# Patient Record
Sex: Female | Born: 1950 | Race: White | Hispanic: No | Marital: Married | Smoking: Never smoker
Health system: Southern US, Community
[De-identification: ages and names within clinical notes are randomized; demographics above are authoritative.]

## PROBLEM LIST (undated history)

## (undated) DIAGNOSIS — N809 Endometriosis, unspecified: Secondary | ICD-10-CM

## (undated) DIAGNOSIS — K219 Gastro-esophageal reflux disease without esophagitis: Secondary | ICD-10-CM

## (undated) DIAGNOSIS — F419 Anxiety disorder, unspecified: Secondary | ICD-10-CM

## (undated) DIAGNOSIS — K589 Irritable bowel syndrome without diarrhea: Secondary | ICD-10-CM

## (undated) DIAGNOSIS — R87619 Unspecified abnormal cytological findings in specimens from cervix uteri: Secondary | ICD-10-CM

## (undated) DIAGNOSIS — IMO0002 Reserved for concepts with insufficient information to code with codable children: Secondary | ICD-10-CM

## (undated) DIAGNOSIS — H269 Unspecified cataract: Secondary | ICD-10-CM

## (undated) DIAGNOSIS — Z9889 Other specified postprocedural states: Secondary | ICD-10-CM

## (undated) DIAGNOSIS — E785 Hyperlipidemia, unspecified: Secondary | ICD-10-CM

## (undated) DIAGNOSIS — N952 Postmenopausal atrophic vaginitis: Secondary | ICD-10-CM

## (undated) DIAGNOSIS — M1990 Unspecified osteoarthritis, unspecified site: Secondary | ICD-10-CM

## (undated) DIAGNOSIS — Z8489 Family history of other specified conditions: Secondary | ICD-10-CM

## (undated) DIAGNOSIS — M7070 Other bursitis of hip, unspecified hip: Secondary | ICD-10-CM

## (undated) DIAGNOSIS — I10 Essential (primary) hypertension: Secondary | ICD-10-CM

## (undated) DIAGNOSIS — T7840XA Allergy, unspecified, initial encounter: Secondary | ICD-10-CM

## (undated) DIAGNOSIS — R112 Nausea with vomiting, unspecified: Secondary | ICD-10-CM

## (undated) HISTORY — DX: Hyperlipidemia, unspecified: E78.5

## (undated) HISTORY — DX: Endometriosis, unspecified: N80.9

## (undated) HISTORY — DX: Unspecified abnormal cytological findings in specimens from cervix uteri: R87.619

## (undated) HISTORY — DX: Other bursitis of hip, unspecified hip: M70.70

## (undated) HISTORY — PX: COLONOSCOPY: SHX174

## (undated) HISTORY — PX: CRYOTHERAPY: SHX1416

## (undated) HISTORY — DX: Unspecified cataract: H26.9

## (undated) HISTORY — PX: UPPER GASTROINTESTINAL ENDOSCOPY: SHX188

## (undated) HISTORY — DX: Gastro-esophageal reflux disease without esophagitis: K21.9

## (undated) HISTORY — DX: Irritable bowel syndrome, unspecified: K58.9

## (undated) HISTORY — DX: Reserved for concepts with insufficient information to code with codable children: IMO0002

## (undated) HISTORY — DX: Essential (primary) hypertension: I10

## (undated) HISTORY — DX: Postmenopausal atrophic vaginitis: N95.2

## (undated) HISTORY — PX: EYE SURGERY: SHX253

## (undated) HISTORY — PX: BREAST SURGERY: SHX581

## (undated) HISTORY — DX: Allergy, unspecified, initial encounter: T78.40XA

## (undated) HISTORY — PX: CATARACT EXTRACTION: SUR2

## (undated) HISTORY — PX: CERVIX LESION DESTRUCTION: SHX591

## (undated) HISTORY — PX: JOINT REPLACEMENT: SHX530

## (undated) HISTORY — PX: POLYPECTOMY: SHX149

---
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
## 1971-01-24 HISTORY — PX: TUBAL LIGATION: SHX77

## 1971-01-24 HISTORY — PX: HYSTEROTOMY: SHX1776

## 1982-11-24 HISTORY — PX: ABDOMINAL HYSTERECTOMY: SHX81

## 1993-02-23 HISTORY — PX: TMJ ARTHROPLASTY: SHX1066

## 2000-09-12 ENCOUNTER — Encounter: Admission: RE | Admit: 2000-09-12 | Discharge: 2000-09-12 | Payer: Self-pay | Admitting: *Deleted

## 2000-09-12 ENCOUNTER — Encounter: Payer: Self-pay | Admitting: Family Medicine

## 2001-09-05 ENCOUNTER — Encounter: Payer: Self-pay | Admitting: Family Medicine

## 2001-09-05 ENCOUNTER — Encounter: Admission: RE | Admit: 2001-09-05 | Discharge: 2001-09-05 | Payer: Self-pay | Admitting: Family Medicine

## 2002-05-02 ENCOUNTER — Ambulatory Visit (HOSPITAL_COMMUNITY): Admission: RE | Admit: 2002-05-02 | Discharge: 2002-05-02 | Payer: Self-pay | Admitting: Family Medicine

## 2002-05-02 ENCOUNTER — Encounter: Payer: Self-pay | Admitting: Family Medicine

## 2002-09-19 ENCOUNTER — Encounter: Admission: RE | Admit: 2002-09-19 | Discharge: 2002-09-19 | Payer: Self-pay | Admitting: Family Medicine

## 2002-09-19 ENCOUNTER — Encounter: Payer: Self-pay | Admitting: Family Medicine

## 2003-08-28 ENCOUNTER — Encounter: Admission: RE | Admit: 2003-08-28 | Discharge: 2003-08-28 | Payer: Self-pay | Admitting: Family Medicine

## 2003-09-12 ENCOUNTER — Emergency Department (HOSPITAL_COMMUNITY): Admission: EM | Admit: 2003-09-12 | Discharge: 2003-09-12 | Payer: Self-pay | Admitting: Emergency Medicine

## 2003-12-23 ENCOUNTER — Ambulatory Visit (HOSPITAL_COMMUNITY): Admission: RE | Admit: 2003-12-23 | Discharge: 2003-12-23 | Payer: Self-pay | Admitting: Family Medicine

## 2003-12-28 ENCOUNTER — Ambulatory Visit (HOSPITAL_COMMUNITY): Admission: RE | Admit: 2003-12-28 | Discharge: 2003-12-28 | Payer: Self-pay | Admitting: Family Medicine

## 2004-04-25 HISTORY — PX: BUNIONECTOMY: SHX129

## 2004-07-26 ENCOUNTER — Ambulatory Visit: Payer: Self-pay | Admitting: Gastroenterology

## 2004-09-05 ENCOUNTER — Ambulatory Visit (HOSPITAL_COMMUNITY): Admission: RE | Admit: 2004-09-05 | Discharge: 2004-09-05 | Payer: Self-pay | Admitting: Family Medicine

## 2004-09-23 ENCOUNTER — Encounter: Admission: RE | Admit: 2004-09-23 | Discharge: 2004-09-23 | Payer: Self-pay | Admitting: Family Medicine

## 2005-01-06 ENCOUNTER — Ambulatory Visit (HOSPITAL_COMMUNITY): Admission: RE | Admit: 2005-01-06 | Discharge: 2005-01-06 | Payer: Self-pay | Admitting: Podiatry

## 2005-01-27 ENCOUNTER — Ambulatory Visit (HOSPITAL_COMMUNITY): Admission: RE | Admit: 2005-01-27 | Discharge: 2005-01-27 | Payer: Self-pay | Admitting: Family Medicine

## 2005-02-23 HISTORY — PX: LAPAROSCOPIC CHOLECYSTECTOMY: SUR755

## 2005-05-03 ENCOUNTER — Ambulatory Visit (HOSPITAL_COMMUNITY): Admission: RE | Admit: 2005-05-03 | Discharge: 2005-05-03 | Payer: Self-pay | Admitting: Podiatry

## 2005-06-29 ENCOUNTER — Encounter (INDEPENDENT_AMBULATORY_CARE_PROVIDER_SITE_OTHER): Payer: Self-pay | Admitting: Internal Medicine

## 2005-06-29 LAB — CONVERTED CEMR LAB: Pap Smear: NORMAL

## 2005-10-06 ENCOUNTER — Encounter: Admission: RE | Admit: 2005-10-06 | Discharge: 2005-10-06 | Payer: Self-pay | Admitting: Family Medicine

## 2006-01-25 ENCOUNTER — Ambulatory Visit (HOSPITAL_COMMUNITY): Admission: RE | Admit: 2006-01-25 | Discharge: 2006-01-25 | Payer: Self-pay | Admitting: Family Medicine

## 2006-02-02 ENCOUNTER — Encounter (HOSPITAL_COMMUNITY): Admission: RE | Admit: 2006-02-02 | Discharge: 2006-03-04 | Payer: Self-pay | Admitting: Family Medicine

## 2006-02-22 ENCOUNTER — Ambulatory Visit: Payer: Self-pay | Admitting: Internal Medicine

## 2006-02-28 ENCOUNTER — Ambulatory Visit: Payer: Self-pay | Admitting: Gastroenterology

## 2006-03-15 ENCOUNTER — Encounter: Admission: RE | Admit: 2006-03-15 | Discharge: 2006-03-15 | Payer: Self-pay | Admitting: General Surgery

## 2006-05-15 ENCOUNTER — Ambulatory Visit: Payer: Self-pay | Admitting: Internal Medicine

## 2006-09-21 ENCOUNTER — Ambulatory Visit: Payer: Self-pay | Admitting: Internal Medicine

## 2006-09-27 ENCOUNTER — Encounter (INDEPENDENT_AMBULATORY_CARE_PROVIDER_SITE_OTHER): Payer: Self-pay | Admitting: Internal Medicine

## 2006-09-27 LAB — CONVERTED CEMR LAB
ALT: 25 units/L (ref 0–35)
AST: 17 units/L (ref 0–37)
Albumin: 4.3 g/dL (ref 3.5–5.2)
Alkaline Phosphatase: 66 units/L (ref 39–117)
BUN: 15 mg/dL (ref 6–23)
CO2: 28 meq/L (ref 19–32)
Calcium: 9.1 mg/dL (ref 8.4–10.5)
Chloride: 102 meq/L (ref 96–112)
Cholesterol: 194 mg/dL (ref 0–200)
Creatinine, Ser: 0.67 mg/dL (ref 0.40–1.20)
Glucose, Bld: 98 mg/dL (ref 70–99)
HDL: 64 mg/dL (ref >39)
LDL Cholesterol: 99 mg/dL (ref 0–99)
Potassium: 3.8 meq/L (ref 3.5–5.3)
Sodium: 139 meq/L (ref 135–145)
Total Bilirubin: 0.4 mg/dL (ref 0.3–1.2)
Total CHOL/HDL Ratio: 3
Total Protein: 7.3 g/dL (ref 6.0–8.3)
Triglycerides: 157 mg/dL — ABNORMAL HIGH (ref <150)
VLDL: 31 mg/dL (ref 0–40)

## 2006-09-28 DIAGNOSIS — K589 Irritable bowel syndrome without diarrhea: Secondary | ICD-10-CM | POA: Insufficient documentation

## 2006-09-28 DIAGNOSIS — M26609 Unspecified temporomandibular joint disorder, unspecified side: Secondary | ICD-10-CM | POA: Insufficient documentation

## 2006-09-28 DIAGNOSIS — E785 Hyperlipidemia, unspecified: Secondary | ICD-10-CM | POA: Insufficient documentation

## 2006-09-28 DIAGNOSIS — K219 Gastro-esophageal reflux disease without esophagitis: Secondary | ICD-10-CM | POA: Insufficient documentation

## 2006-09-28 DIAGNOSIS — M129 Arthropathy, unspecified: Secondary | ICD-10-CM | POA: Insufficient documentation

## 2006-09-28 DIAGNOSIS — M545 Low back pain, unspecified: Secondary | ICD-10-CM | POA: Insufficient documentation

## 2006-09-28 DIAGNOSIS — I10 Essential (primary) hypertension: Secondary | ICD-10-CM | POA: Insufficient documentation

## 2006-09-28 DIAGNOSIS — M1990 Unspecified osteoarthritis, unspecified site: Secondary | ICD-10-CM | POA: Insufficient documentation

## 2006-09-28 DIAGNOSIS — J309 Allergic rhinitis, unspecified: Secondary | ICD-10-CM | POA: Insufficient documentation

## 2006-10-17 ENCOUNTER — Encounter: Admission: RE | Admit: 2006-10-17 | Discharge: 2006-10-17 | Payer: Self-pay | Admitting: Internal Medicine

## 2007-01-29 ENCOUNTER — Telehealth (INDEPENDENT_AMBULATORY_CARE_PROVIDER_SITE_OTHER): Payer: Self-pay | Admitting: Internal Medicine

## 2007-01-31 ENCOUNTER — Encounter (INDEPENDENT_AMBULATORY_CARE_PROVIDER_SITE_OTHER): Payer: Self-pay | Admitting: Internal Medicine

## 2007-03-06 ENCOUNTER — Ambulatory Visit: Payer: Self-pay | Admitting: Internal Medicine

## 2007-03-06 DIAGNOSIS — R209 Unspecified disturbances of skin sensation: Secondary | ICD-10-CM | POA: Insufficient documentation

## 2007-05-18 ENCOUNTER — Ambulatory Visit (HOSPITAL_COMMUNITY): Admission: RE | Admit: 2007-05-18 | Discharge: 2007-05-18 | Payer: Self-pay | Admitting: Internal Medicine

## 2007-05-23 ENCOUNTER — Ambulatory Visit: Payer: Self-pay | Admitting: Gastroenterology

## 2007-05-23 ENCOUNTER — Ambulatory Visit (HOSPITAL_COMMUNITY): Admission: RE | Admit: 2007-05-23 | Discharge: 2007-05-23 | Payer: Self-pay | Admitting: Internal Medicine

## 2007-06-07 ENCOUNTER — Ambulatory Visit: Payer: Self-pay | Admitting: Gastroenterology

## 2007-10-24 ENCOUNTER — Encounter: Admission: RE | Admit: 2007-10-24 | Discharge: 2007-10-24 | Payer: Self-pay | Admitting: Internal Medicine

## 2008-04-30 ENCOUNTER — Encounter (HOSPITAL_COMMUNITY): Admission: RE | Admit: 2008-04-30 | Discharge: 2008-05-30 | Payer: Self-pay | Admitting: Orthopedic Surgery

## 2008-10-29 ENCOUNTER — Encounter: Admission: RE | Admit: 2008-10-29 | Discharge: 2008-10-29 | Payer: Self-pay | Admitting: Internal Medicine

## 2009-10-18 ENCOUNTER — Encounter: Admission: RE | Admit: 2009-10-18 | Discharge: 2009-10-18 | Payer: Self-pay | Admitting: Internal Medicine

## 2010-10-16 ENCOUNTER — Encounter: Payer: Self-pay | Admitting: Family Medicine

## 2010-10-20 ENCOUNTER — Encounter
Admission: RE | Admit: 2010-10-20 | Discharge: 2010-10-20 | Payer: Self-pay | Source: Home / Self Care | Attending: Internal Medicine | Admitting: Internal Medicine

## 2010-10-27 NOTE — Progress Notes (Signed)
Summary: pulled muscle  Phone Note Call from Patient   Reason for Call: Talk to Nurse Summary of Call: patient thinks she pulled a muscle and is having pain radiating into her hip...i don't have anything available until friday 05.09.08.Marland KitchenMarland Kitchenplease triage  [phone] Initial call taken by: Donneta Romberg,  Jan 29, 2007 11:31 AM  Follow-up for Phone Call        She was cleaning(moving rugs/furniture/etc) and thinks she pulled a muscle. Advise ice/aleve and call clinic in AM if no better. Voices understanding Follow-up by: Sonny Dandy,  Jan 29, 2007 1:41 PM  Additional Follow-up for Phone Call Additional follow up Details #1::        Agree Additional Follow-up by: Erle Crocker MD,  Jan 29, 2007 1:59 PM    Appended Document: pulled muscle Patient states she is some better, but still tender in abd. Aleve and ice helped with back. Wants to be seen, will schedule her for Monday @ 1:30pm

## 2010-10-27 NOTE — Assessment & Plan Note (Signed)
Summary: numbness 4th and 5th fingers on L   Vital Signs:  Patient Profile:   59 Years Old Female O2 Sat:      98 % Pulse rate:   100 / minute Resp:     9 per minute BP sitting:   138 / 94  (right arm)  Vitals Entered By: Lutricia Horsfall (March 06, 2007 4:03 PM) Oxygen therapy Room Air               Chief Complaint:  numbness and tingling in left hand and arm.  History of Present Illness: Jennifer Davenport presents today having trouble with numbness in her fourth and fifth fingers of her left hand.  It  has been going on for about 4 to 5 months, and worsening in intensity.  Early in the day she may note no symptoms eventually she'll develop the numbness and even gets some pain in her arm late in the day.  She is also noting what she calls a burning sensation that radiates around her ear and awakens her at night.  It will get better if she stands up.  She noted some of this at the beginning of the year but it resolved.  She has noted it nightly for the past 6 weeks.  She will also know what she called a knot at the base of her neck at the end of each day.  Current Allergies: SULFA CODEINE ASPIRIN ROCEPHIN  Past Medical History:    Reviewed history from 01/31/2007 and no changes required:       GERD       Hyperlipidemia       Hypertension       Low back pain       Osteoarthritis       Allergic rhinitis       IBS       TMJ      Physical Exam  General:     Obese, in no acute distress. Alert and oriented X 3.  Msk:     there is some tenderness to palpation over the upper trapezius muscle on the left at the insertion into the occiput.  There is also tenderness to palpation over the left scalene muscles.  There is no point tenderness of the spine. Neurologic:     she does have some decreased strength of the fourth and fifth fingers of the left hand as I am able to break a ring made with her thumb, which I can not do on her right.    Impression &  Recommendations:  Problem # 1:  NUMBNESS, HAND (ICD-782.0) her numbness certainly seems to be in a dermatomal distribution,  suggestive of C-spine disease.  We arranged treat conservatively with Relafen 750 mg twice a day on a regular schedule for at least two weeks as well as neck stretching exercises.  I've asked her to let me know if this does not make her feel better and then we will get some imaging studies.  Medications Added to Medication List This Visit: 1)  Nabumetone 750 Mg Tabs (Nabumetone) .[name].. 1 by mouth two times a day

## 2010-10-27 NOTE — Letter (Signed)
Summary: Historic correspondence  Historic correspondence   Imported By: Curtis Sites 07/19/2009 14:20:27  _____________________________________________________________________  External Attachment:    Type:   Image     Comment:   External Document

## 2010-10-27 NOTE — Letter (Signed)
Summary: Historic rma chart  Historic rma chart   Imported By: Curtis Sites 07/15/2009 10:24:03  _____________________________________________________________________  External Attachment:    Type:   Image     Comment:   External Document

## 2011-02-10 NOTE — H&P (Signed)
Jennifer Davenport, Jennifer Davenport              ACCOUNT NO.:  [id number]   MEDICAL RECORD NO.:  [id number]          PATIENT TYPE:  AMB   LOCATION:  DAY                           FACILITY:  [facility]   PHYSICIAN:  Denny Peon. Ulice Brilliant, D.P.M.  DATE OF BIRTH:  03/18/1951   DATE OF ADMISSION:  05/03/2005  DATE OF DISCHARGE:  LH                                HISTORY & PHYSICAL   HISTORY OF PRESENT ILLNESS:  Painful HAV deformity of left foot.  The  patient presents and relates that this has been bothering her for probably  16 months at this point.  It seems like it is getting progressively worse.  It basically aches all the time, worse with certain shoes.   PAST MEDICAL HISTORY:  1.  Significant for some hypertension.  2.  History of TMJ.  She has and previous surgery for this.  3.  She has had a hysterectomy.  4.  She has had 2 children.   CURRENT MEDICATIONS:  Triamterene.   ALLERGIES:  1.  SULFA.  2.  CODEINE.  3.  NSAIDS cause some reflux.  4.  ASPIRIN.  5.  It has also come to my attention that she may have an allergy to      ROCEPHIN.   SOCIAL HISTORY:  She drinks very occasionally.  She does not smoke.  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   OBJECTIVE:  Pronounced HAV deformity of left foot.  The medial eminence is  noted to be pretty prominent.  The great toe is beginning to abut the second  toe.   Radiographs were taken, and she has an increase in the IM angle between the  first and second metatarsal.  The great toe is abutting the second toe.  She  looks like she may have a deviation in the pasa.  There looks like some  arthritic change around the medial aspect of the first metatarsal head.  There is a spur noted on the medial aspect of the first metatarsal head,  medial to the base of the proximal phalanx.  There looks to be some  arthritic change around the fibular sesamoid.   ASSESSMENT:  HAV deformity, left.   PLAN:  Surgical correction will consistent of  an Austin bunionectomy of the  left foot.  This may possibly have to be converted to a Reverdin  bunionectomy, left foot, depending on the position of the articular  cartilage.  I have discussed the procedure with the patient, the fact that  we will be removing some bone from the medial eminence, as  well as performing an osteotomy, along with correcting with internal  fixation.  She has been described the usual postoperative course for an  [employer].  She has read her consent form, apparently understood,  and signed.  She has played a part in the discussion during this consent  signing about the possibility of complications.       CMD/MEDQ  D:  05/02/2005  T:  05/02/2005  Job:  [id number]

## 2011-02-10 NOTE — Op Note (Signed)
NAMEAUDELIA, KNAPE              ACCOUNT NO.:  [id number]   MEDICAL RECORD NO.:  [id number]          PATIENT TYPE:  AMB   LOCATION:  DAY                           FACILITY:  [facility]   PHYSICIAN:  Denny Peon. Ulice Brilliant, D.P.M.  DATE OF BIRTH:  08-07-1951   DATE OF PROCEDURE:  05/03/2005  DATE OF DISCHARGE:                                 OPERATIVE REPORT   PREOPERATIVE DIAGNOSIS:  Hallux abductovalgus deformity, left foot. Ingrown  toe nail, fibular nail margin, right great toe.   POSTOPERATIVE DIAGNOSIS:  Hallux abductovalgus deformity, left foot. Ingrown  toe nail, fibular nail margin, right great toe.   PROCEDURE PERFORMED:  Austin bunionectomy, left foot. Phenol and alcohol  removal of nail margin and chemical cauterization, right great toe   SURGEON:  Cody M. Ulice Brilliant, D.P.M.   ANESTHESIA:  Monitored anesthesia care.   INDICATIONS FOR SURGERY:  Acute painful ingrown toenail, right great toe.  Long-standing problems with HAV deformity of the left foot. The patient is  clinically noted to have a pronounced medial eminence, great toe is abutting  the second toe. Radiographs reveal a large increase in the IM angle. The HAV  angle is increased. She has got arthritic change about the dorsal and medial  aspect of the joint itself.   DESCRIPTION OF PROCEDURE:  Ms. Laroque was brought into the OR and placed on  the table in a supine position. A pneumatic ankle tourniquet is applied  across her left ankle. IV sedation is established. A Mayo block is performed  about the first MTP of the left foot. A digital block is performed about the  right great toe. Both blocks are a combination of 1:1 lidocaine and  Marcaine. The left foot is prepped and draped in the usual aseptic fashion.  An Ace bandage was utilized to exsanguinate the left foot. The tourniquet  was inflated to 250 mmHg.   PROCEDURE NOTE:  Austin bunionectomy, left foot. Attention is directed to  this first MTP of the left foot. A 7-cm  curvilinear skin incision is made.  The incision is deepened by sharp and blunt dissection through the  subcutaneous tissues. Care is taken to get good exposure of the medial  aspect of the joint capsule. An inverted L capsulotomy was then performed.  The capsule and periosteal tissues were reflected away from the underlying  bony surface. The medial eminence was delivered into the surgical wound and  excised utilizing a #63 blade on the oscillating saw. The first interspace  was then approached through the original skin incision with the aid of a  Weitlaner retractor. Dissection is carried down to the level of the fibular  sesamoid. An abductor hallicis tenotomy is performed with lateral capsular  release. Attention was then directed back to the first metatarsal head. A  0.045 K wire was introduced and utilized to act as a pin axis guide. The  osteotomy cuts were then made with a #62 blade and the #63 blade on the  oscillating saw. The capital fragment was relocated laterally approximately  50% across the width of the first metatarsal. This was  then fixated via two  0.045 K wires driven in a divergent fashion so that redundant protrusion of  K wire is left exiting through articular surface. This was both visualized  intraoperatively utilizing __________ scan and putting the toe through a  range of motion to find there is no restriction. The redundant bone medially  was then excised utilizing the #63 blade and an oscillating saw. The  osteotomy was deemed stable. The newly formed osseous surface was rasped  smooth. Prior to making the [facility] cuts, we did remove an osteophyte  dorsally from the first metatarsal head. Following rasping and smoothing  down the newly formed osseous surface, the wound was flushed with copious  amounts of irrigant. Capsule tissues were reapproximated and closed with 3-0  Vicryl. Subcutaneous tissue were reapproximated and closed with 4-0 Vicryl.  Skin was closed  with 4-0 Vicryl and a running subcuticular suture. Steri-  Strips were applied to the foot postoperatively. A postoperative injection  of Marcaine and Hexadrol was dispensed. A Betadine soaked Adaptic dressing  and a dry sterile compressive dressing follows. The tourniquet was deflated  on his left ankle, the tourniquet time spanning roughly 45 to 55 minutes.   Following completion of the left foot and securing of the foot in a bandage,  the patient is removed of draping. The right foot is exposed. It is prepped  with Betadine solution. A freer elevator is then introduced and utilized to  resect soft tissue adherence away from the ingrowing nail margin of the  fibular border of the right great toe. A straight nail nipper was utilized  to split the nail longitudinally from distal to proximal, removing an  approximate 1/4 to 1/5 of the width of the nail through this lateral nail  wedge. The nail matrix to this removed nail margin is then cauterized  utilizing sodium hydroxide with three 30-second applications. This was then  flushed utilizing alcohol. Thi was then dressed utilizing neosporin and  adaptive dressing, 2x2 and 2-inch cling.   Ms. Kinkaid tolerated the anesthesia and procedure well. She was transported  to day hospital without incident. A cam walker was applied to her left foot,  a surgical shoe to the right. A list of written instructions including  postoperative wound care instructions for the right are dispensed. A  prescription for Mepergan fortis is dispensed as the patient as a CODEINE  ALLERGY, dispensed 35 one p.o. q.3-4h. postoperative pain. I have instructed  her on elevation and ice. She is also instructed that all weight bearing  needs to take place with a cam walker present on the left foot. She is  encouraged not to rest with it on, however. She will be seen within seven  days for first postoperative visit.       CMD/MEDQ  D:  05/03/2005  T:  05/03/2005  Job:   [id number]

## 2011-04-20 LAB — HM PAP SMEAR: HM Pap smear: NEGATIVE

## 2011-09-12 ENCOUNTER — Other Ambulatory Visit: Payer: Self-pay | Admitting: Internal Medicine

## 2011-09-12 DIAGNOSIS — Z1231 Encounter for screening mammogram for malignant neoplasm of breast: Secondary | ICD-10-CM

## 2011-10-23 ENCOUNTER — Ambulatory Visit
Admission: RE | Admit: 2011-10-23 | Discharge: 2011-10-23 | Disposition: A | Discharge status: Home / Self Care | Payer: PRIVATE HEALTH INSURANCE | Source: Ambulatory Visit | Attending: Internal Medicine | Admitting: Internal Medicine

## 2011-10-23 DIAGNOSIS — Z1231 Encounter for screening mammogram for malignant neoplasm of breast: Secondary | ICD-10-CM

## 2012-04-17 ENCOUNTER — Encounter: Payer: Self-pay | Admitting: Gastroenterology

## 2012-09-25 LAB — HM COLONOSCOPY

## 2012-10-07 ENCOUNTER — Other Ambulatory Visit: Payer: Self-pay | Admitting: Internal Medicine

## 2012-10-07 DIAGNOSIS — Z1231 Encounter for screening mammogram for malignant neoplasm of breast: Secondary | ICD-10-CM

## 2012-10-23 ENCOUNTER — Ambulatory Visit
Admission: RE | Admit: 2012-10-23 | Discharge: 2012-10-23 | Disposition: A | Discharge status: Home / Self Care | Payer: BC Managed Care – PPO | Source: Ambulatory Visit | Attending: Internal Medicine | Admitting: Internal Medicine

## 2012-10-23 DIAGNOSIS — Z1231 Encounter for screening mammogram for malignant neoplasm of breast: Secondary | ICD-10-CM

## 2012-10-26 DIAGNOSIS — IMO0002 Reserved for concepts with insufficient information to code with codable children: Secondary | ICD-10-CM

## 2012-10-26 HISTORY — DX: Reserved for concepts with insufficient information to code with codable children: IMO0002

## 2013-01-14 ENCOUNTER — Encounter: Payer: Self-pay | Admitting: Gastroenterology

## 2013-01-21 ENCOUNTER — Encounter: Payer: Self-pay | Admitting: Gastroenterology

## 2013-03-05 ENCOUNTER — Other Ambulatory Visit: Payer: Self-pay | Admitting: Family Medicine

## 2013-03-05 DIAGNOSIS — E559 Vitamin D deficiency, unspecified: Secondary | ICD-10-CM

## 2013-03-05 DIAGNOSIS — E2839 Other primary ovarian failure: Secondary | ICD-10-CM

## 2013-03-11 ENCOUNTER — Ambulatory Visit
Admission: RE | Admit: 2013-03-11 | Discharge: 2013-03-11 | Disposition: A | Discharge status: Home / Self Care | Payer: BC Managed Care – PPO | Source: Ambulatory Visit | Attending: Family Medicine | Admitting: Family Medicine

## 2013-03-11 ENCOUNTER — Other Ambulatory Visit: Payer: BC Managed Care – PPO

## 2013-03-11 DIAGNOSIS — E2839 Other primary ovarian failure: Secondary | ICD-10-CM

## 2013-03-11 DIAGNOSIS — E559 Vitamin D deficiency, unspecified: Secondary | ICD-10-CM

## 2013-03-18 ENCOUNTER — Ambulatory Visit (AMBULATORY_SURGERY_CENTER): Payer: BC Managed Care – PPO

## 2013-03-18 VITALS — Ht 61.0 in | Wt 171.2 lb

## 2013-03-18 DIAGNOSIS — Z1211 Encounter for screening for malignant neoplasm of colon: Secondary | ICD-10-CM

## 2013-03-18 DIAGNOSIS — Z86010 Personal history of colonic polyps: Secondary | ICD-10-CM

## 2013-03-18 MED ORDER — MOVIPREP 100 G PO SOLR: ORAL | Status: DC

## 2013-03-19 ENCOUNTER — Encounter: Payer: Self-pay | Admitting: Gastroenterology

## 2013-04-01 ENCOUNTER — Ambulatory Visit (AMBULATORY_SURGERY_CENTER): Payer: BC Managed Care – PPO | Admitting: Gastroenterology

## 2013-04-01 ENCOUNTER — Encounter: Payer: Self-pay | Admitting: Gastroenterology

## 2013-04-01 VITALS — BP 121/73 | HR 57 | Temp 97.6°F | Resp 16 | Ht 61.0 in | Wt 171.0 lb

## 2013-04-01 DIAGNOSIS — Z860100 Personal history of colon polyps, unspecified: Secondary | ICD-10-CM

## 2013-04-01 DIAGNOSIS — D126 Benign neoplasm of colon, unspecified: Secondary | ICD-10-CM

## 2013-04-01 MED ORDER — SODIUM CHLORIDE 0.9 % IV SOLN: 500.0000 mL | INTRAVENOUS | Status: DC

## 2013-04-01 NOTE — Progress Notes (Signed)
Patient did not have preoperative order for IV antibiotic SSI prophylaxis. (G8918)  Patient did not experience any of the following events: a burn prior to discharge; a fall within the facility; wrong site/side/patient/procedure/implant event; or a hospital transfer or hospital admission upon discharge from the facility. (G8907)  

## 2013-04-01 NOTE — Progress Notes (Signed)
Called to room to assist during endoscopic procedure.  Patient ID and intended procedure confirmed with present staff. Received instructions for my participation in the procedure from the performing physician.  

## 2013-04-01 NOTE — Patient Instructions (Addendum)

## 2013-04-01 NOTE — Op Note (Signed)
[facility] [address] [place], [address]   COLONOSCOPY PROCEDURE REPORT  PATIENT: Jennifer, Davenport  MR#: [id number] BIRTHDATE: Apr 25, 1951 , 61  yrs. old GENDER: Female ENDOSCOPIST: Meryl Dare, MD, [facility] PROCEDURE DATE:  04/01/2013 PROCEDURE:   Colonoscopy with biopsy ASA CLASS:   Class II INDICATIONS:Patient's personal history of adenomatous colon polyps and Last colonoscopy performed 5 years ago. MEDICATIONS: MAC sedation, administered by CRNA and propofol (Diprivan) 300mg  IV DESCRIPTION OF PROCEDURE:   After the risks benefits and alternatives of the procedure were thoroughly explained, informed consent was obtained.  A digital rectal exam revealed no abnormalities of the rectum.   The LB [id number]  endoscope was introduced through the anus and advanced to the cecum, which was identified by both the appendix and ileocecal valve. No adverse events experienced.   The quality of the prep was good, using MoviPrep  The instrument was then slowly withdrawn as the colon was fully examined.  COLON FINDINGS: A sessile polyp measuring 3 mm in size was found in the transverse colon.  A polypectomy was performed with cold forceps.  The resection was complete and the polyp tissue was completely retrieved.   A sessile polyp measuring 4 mm in size was found in the rectum.  A polypectomy was performed with cold forceps. The resection was complete and the polyp tissue was completely retrieved. Mild diverticulosis was noted in the sigmoid colon. The colon was otherwise normal.  There was no diverticulosis, inflammation, polyps or cancers unless previously stated.  Retroflexed views revealed small internal hemorrhoids. The time to cecum=2 minutes 17 seconds. Withdrawal time=9 minutes 15 seconds. The scope was withdrawn and the procedure completed. COMPLICATIONS: There were no complications.  ENDOSCOPIC IMPRESSION: 1.   Sessile polyp measuring 3 mm in the  transverse colon; polypectomy performed with cold forceps 2.   Sessile polyp measuring 4 mm in the rectum; polypectomy performed with cold forceps 3.   Mild diverticulosis was noted in the sigmoid colon 4.   Small internal hemorrhoids  RECOMMENDATIONS: 1.  Await pathology results 2.  High fiber diet with liberal fluid intake. 3.  Repeat Colonoscopy in 5 years.  eSigned:  Meryl Dare, MD, [facility] 04/01/2013 9:30 AM   cc: Beverley Fiedler, MD

## 2013-04-02 ENCOUNTER — Telehealth: Payer: Self-pay | Admitting: *Deleted

## 2013-04-02 NOTE — Telephone Encounter (Signed)
  Follow up Call-  Call back number 04/01/2013  Post procedure Call Back phone  # [phone]  Permission to leave phone message Yes     Patient questions:  Do you have a fever, pain , or abdominal swelling? no Pain Score  0 *  Have you tolerated food without any problems? yes  Have you been able to return to your normal activities? yes  Do you have any questions about your discharge instructions: Diet   no Medications  no Follow up visit  no  Do you have questions or concerns about your Care? no  Actions: * If pain score is 4 or above: No action needed, pain <4.

## 2013-04-09 ENCOUNTER — Encounter: Payer: Self-pay | Admitting: Gastroenterology

## 2013-04-17 ENCOUNTER — Encounter: Payer: Self-pay | Admitting: *Deleted

## 2013-04-24 ENCOUNTER — Ambulatory Visit (INDEPENDENT_AMBULATORY_CARE_PROVIDER_SITE_OTHER): Payer: BC Managed Care – PPO | Admitting: Nurse Practitioner

## 2013-04-24 ENCOUNTER — Encounter: Payer: Self-pay | Admitting: Nurse Practitioner

## 2013-04-24 VITALS — BP 130/62 | HR 74 | Resp 14 | Ht 60.25 in | Wt 175.0 lb

## 2013-04-24 DIAGNOSIS — Z0000 Encounter for general adult medical examination without abnormal findings: Secondary | ICD-10-CM

## 2013-04-24 DIAGNOSIS — Z01419 Encounter for gynecological examination (general) (routine) without abnormal findings: Secondary | ICD-10-CM

## 2013-04-24 LAB — POCT URINALYSIS DIPSTICK
Leukocytes, UA: NEGATIVE
Spec Grav, UA: 1.015
Urobilinogen, UA: NEGATIVE
pH, UA: 6

## 2013-04-24 MED ORDER — ESTRADIOL 0.1 MG/GM VA CREA: TOPICAL_CREAM | VAGINAL | Status: DC

## 2013-04-24 NOTE — Patient Instructions (Signed)

## 2013-04-24 NOTE — Progress Notes (Signed)
Encounter reviewed by Dr. Brook Silva.  

## 2013-04-24 NOTE — Progress Notes (Signed)
62 y.o. [id number] Married Caucasian Fe here for annual exam.  Has used estrace vaginal cream and Vagifem in past and likes the cream better.  Also using coconut oil prn for lubrication. No other health problems.  No LMP recorded. Patient has had a hysterectomy.          Sexually active: yes  The current method of family planning is status post hysterectomy.    Exercising: yes  treadmill and walking  Smoker:  no  Health Maintenance: Pap:  04/20/2011  negative MMG:  10/23/2012 normal with 3 D Colonoscopy:  03/2013 2 polyps 1 pre cancer - repeat in 5 years BMD:   02/2013 'normal' TDaP:  2014 Labs: PCP maintains all labs.    reports that she has never smoked. She has never used smokeless tobacco. She reports that she does not drink alcohol or use illicit drugs.  Past Medical History  Diagnosis Date  . Hyperlipidemia   . Hypertension   . Allergy   . IBS (irritable bowel syndrome)   . Bulging disc     CERVICAL  . GERD (gastroesophageal reflux disease)   . Atrophic vaginitis   . Endometriosis   . Acid reflux   . IBS (irritable bowel syndrome)     Past Surgical History  Procedure Laterality Date  . Appendectomy    . Tmj arthroplasty    . Cholecystectomy    . Bunionectomy      left foot  . Abdominal hysterectomy    . Tubal ligation Bilateral     Current Outpatient Prescriptions  Medication Sig Dispense Refill  . amLODipine (NORVASC) 2.5 MG tablet Take 2.5 mg by mouth daily.      . Ascorbic Acid (VITAMIN C) 1000 MG tablet Take 1,000 mg by mouth daily.      . calcium carbonate (OS-CAL) 600 MG TABS Take 600 mg by mouth 2 (two) times daily with a meal.      . cetirizine (ZYRTEC) 10 MG tablet Take 10 mg by mouth daily.      . Cholecalciferol (VITAMIN D-3) 1000 UNITS CAPS Take by mouth daily.      . Coenzyme Q10 (COQ10) 100 MG CAPS Take by mouth daily.      . Flavoring Agent (PEPPERMINT FLAVOR) OIL by Does not apply route.      [name] losartan-hydrochlorothiazide (HYZAAR) 100-12.5 MG per  tablet Take 1 tablet by mouth daily.      . Multiple Vitamin (MULTIVITAMIN) tablet Take 1 tablet by mouth daily. [employer] [facility]      . NON FORMULARY Holy Basil 536 mg-Take 2 pills bid      . NON FORMULARY Acid Soothe-Take 1 capsule after meals      . Omega-3 Fatty Acids (FISH OIL) 1200 MG CAPS Take 1,400 mg by mouth daily.      . peppermint oil liquid 1 capsule daily.      . Probiotic Product (SOLUBLE FIBER/PROBIOTICS PO) Take by mouth.      . simvastatin (ZOCOR) 40 MG tablet Take 40 mg by mouth every evening.      . triamcinolone (NASACORT) 55 MCG/ACT nasal inhaler Place 2 sprays into the nose daily.      . vitamin E 400 UNIT capsule Take 400 Units by mouth daily.       No current facility-administered medications for this visit.    Family History  Problem Relation Age of Onset  . Diverticulitis Mother   . Breast cancer Mother   . Hypertension Mother   .  Heart disease Mother   . Osteoarthritis Mother   . Heart disease Brother   . Hypertension Brother   . Cancer Brother     lung cancer  . Heart disease Sister   . Hypertension Sister   . Osteoarthritis Sister   . Hypertension Maternal Uncle   . Heart disease Maternal Uncle   . Hypertension Maternal Grandfather   . Heart disease Maternal Grandfather     ROS:  Pertinent items are noted in HPI.  Otherwise, a comprehensive ROS was negative.  Exam:   BP 130/62  Pulse 74  Resp 14  Ht 5' 0.25" (1.53 m)  Wt 175 lb (79.379 kg)  BMI 33.91 kg/m2 Height: 5' 0.25" (153 cm)  Ht Readings from Last 3 Encounters:  04/24/13 5' 0.25" (1.53 m)  04/01/13 5\' 1"  (1.549 m)  03/18/13 5\' 1"  (1.549 m)    General appearance: alert, cooperative and appears stated age Head: Normocephalic, without obvious abnormality, atraumatic Neck: no adenopathy, supple, symmetrical, trachea midline and thyroid normal to inspection and palpation Lungs: clear to auscultation bilaterally Breasts: normal appearance, no masses or tenderness Heart: regular rate and  rhythm Abdomen: soft, non-tender; no masses,  no organomegaly Extremities: extremities normal, atraumatic, no cyanosis or edema Skin: Skin color, texture, turgor normal. No rashes or lesions Lymph nodes: Cervical, supraclavicular, and axillary nodes normal. No abnormal inguinal nodes palpated Neurologic: Grossly normal   Pelvic: External genitalia:  no lesions              Urethra:  normal appearing urethra with no masses, tenderness or lesions              Bartholin's and Skene's: normal                 Vagina: normal appearing vagina with normal color and discharge, no lesions              Cervix: absent              Pap taken: no Bimanual Exam:  Uterus:  uterus absent              Adnexa: no mass, fullness, tenderness               Rectovaginal: Confirms               Anus:  normal sphincter tone, no lesions  A:  Well Woman with normal exam  S/P TAH secondary to endometriosis & AUB 11/1982  Atrophic vaginitis  P:   Pap smear as per guidelines   Mammogram due 1/ 2015  counseled on breast self exam, adequate intake of calcium and vitamin D, diet and exercise return annually or prn  An After Visit Summary was printed and given to the patient.

## 2013-07-31 ENCOUNTER — Other Ambulatory Visit: Payer: Self-pay

## 2013-09-22 ENCOUNTER — Other Ambulatory Visit: Payer: Self-pay

## 2013-09-22 DIAGNOSIS — Z1231 Encounter for screening mammogram for malignant neoplasm of breast: Secondary | ICD-10-CM

## 2013-09-25 LAB — HM MAMMOGRAPHY

## 2013-10-24 ENCOUNTER — Ambulatory Visit
Admission: RE | Admit: 2013-10-24 | Discharge: 2013-10-24 | Disposition: A | Discharge status: Home / Self Care | Payer: BC Managed Care – PPO | Source: Ambulatory Visit

## 2013-10-24 DIAGNOSIS — Z1231 Encounter for screening mammogram for malignant neoplasm of breast: Secondary | ICD-10-CM

## 2013-12-29 ENCOUNTER — Telehealth: Payer: Self-pay | Admitting: Gastroenterology

## 2013-12-29 NOTE — Telephone Encounter (Signed)
Patient reports chronic loose stool , but has developed very watery yellow diarrhea with lower abdominal cramping.  She does have history with Dr. Fuller Plan.  She will come in and see Nicoletta Ba PA on Wed 12/31/13 2:00

## 2013-12-31 ENCOUNTER — Ambulatory Visit (INDEPENDENT_AMBULATORY_CARE_PROVIDER_SITE_OTHER): Payer: BC Managed Care – PPO | Admitting: Physician Assistant

## 2013-12-31 ENCOUNTER — Encounter: Payer: Self-pay | Admitting: Physician Assistant

## 2013-12-31 VITALS — BP 142/84 | HR 64 | Ht 61.0 in | Wt 167.8 lb

## 2013-12-31 DIAGNOSIS — K589 Irritable bowel syndrome without diarrhea: Secondary | ICD-10-CM

## 2013-12-31 DIAGNOSIS — R197 Diarrhea, unspecified: Secondary | ICD-10-CM

## 2013-12-31 DIAGNOSIS — K5732 Diverticulitis of large intestine without perforation or abscess without bleeding: Secondary | ICD-10-CM

## 2013-12-31 MED ORDER — DICYCLOMINE HCL 10 MG PO CAPS: 10.0000 mg | ORAL_CAPSULE | Freq: Three times a day (TID) | ORAL | Status: DC

## 2013-12-31 MED ORDER — DICYCLOMINE HCL 10 MG PO CAPS
ORAL_CAPSULE | ORAL | Status: DC
Start: 2013-12-31 — End: 2015-02-11

## 2013-12-31 MED ORDER — CIPROFLOXACIN HCL 500 MG PO TABS
500.0000 mg | ORAL_TABLET | Freq: Two times a day (BID) | ORAL | Status: DC
Start: 2013-12-31 — End: 2014-01-07

## 2013-12-31 MED ORDER — RESTORA PO CAPS: 1.0000 | ORAL_CAPSULE | Freq: Every day | ORAL | Status: DC

## 2013-12-31 NOTE — Patient Instructions (Signed)
We have sent prescritpions to [facility]. 1. Cipro 2. Bentyl ( dicyclomine )   Restora probiotic samples given. Take 1 daily. (Take a probiotic daily for 1 month. )

## 2013-12-31 NOTE — Progress Notes (Signed)
Sauubjective:    Patient ID: Jennifer Davenport, female    DOB: 06-01-1951, 63 y.o.   MRN: [id number]  HPI  Jennifer Davenport is a pleasant 63 year old white female known to Dr. Fuller Plan from previous colonoscopies. She does have history of adenomatous colon polyps. Last colonoscopy was done in July of 2014. She had 2 small polyps removed from the transverse colon and rectum at that time . One was hyperplastic one was an adenoma. She was also noted to have mild diverticulosis in the sigmoid colon and internal hemorrhoids. 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 She comes in today stating that she's been having more problems with diarrhea and abdominal cramping over the past couple of weeks. She had tried to natural supplements which he brings with her. One was called Colon  Care formula and the other an Acacia fiber supplement. She says after she started the second supplement she started having worse problems with abdominal pain and cramping. She has stopped both supplements now .Since last Thursday she has been having significant discomfort in her left lower abdomen which she describes as achy and crampy. She has had increase in  Diarrhea  And has also been having bouts of yellowish diarrhea and increased mucus in her stool. She says her typical pattern is to have 3-4 loose bowel movements in the morning and has been having up to 7 bowel movements per day over the past few days. She's had a low-grade temp 99 range and ongoing tenderness across her lower abdomen no nausea or vomiting. No recent antibiotics and no new meds.    Review of Systems  Constitutional: Negative.   HENT: Negative.   Eyes: Negative.   Respiratory: Negative.   Cardiovascular: Negative.   Gastrointestinal: Positive for abdominal pain and diarrhea.  Endocrine: Negative.   Genitourinary: Negative.   Musculoskeletal: Negative.   Skin: Negative.   Allergic/Immunologic: Negative.   Neurological: Negative.     Hematological: Negative.   Psychiatric/Behavioral: Negative.    Outpatient Prescriptions Prior to Visit  Medication Sig Dispense Refill  . amLODipine (NORVASC) 2.5 MG tablet Take 2.5 mg by mouth daily.      . calcium carbonate (OS-CAL) 600 MG TABS Take 600 mg by mouth 2 (two) times daily with a meal.      . Cholecalciferol (VITAMIN D-3) 1000 UNITS CAPS Take by mouth daily.      . Flavoring Agent (PEPPERMINT FLAVOR) OIL by Does not apply route.      [name] losartan-hydrochlorothiazide (HYZAAR) 100-12.5 MG per tablet Take 1 tablet by mouth daily.      . Multiple Vitamin (MULTIVITAMIN) tablet Take 1 tablet by mouth daily. [facility]      . NON FORMULARY Holy Basil 536 mg-Take 2 pills bid      . Omega-3 Fatty Acids (FISH OIL) 1200 MG CAPS Take 1,400 mg by mouth daily.      . peppermint oil liquid 1 capsule daily.      . Probiotic Product (SOLUBLE FIBER/PROBIOTICS PO) Take by mouth.      . simvastatin (ZOCOR) 40 MG tablet Take 40 mg by mouth every evening.      . triamcinolone (NASACORT) 55 MCG/ACT nasal inhaler Place 2 sprays into the nose daily.      . vitamin E 400 UNIT capsule Take 400 Units by mouth daily.      . Ascorbic Acid (VITAMIN C) 1000 MG tablet Take 1,000 mg by mouth daily.      . cetirizine (  ZYRTEC) 10 MG tablet Take 10 mg by mouth daily.      . Coenzyme Q10 (COQ10) 100 MG CAPS Take by mouth daily.      [name] estradiol (ESTRACE) 0.1 MG/GM vaginal cream Use 1/2 g vaginally every night for the first 2 weeks, then use 1/2 g vaginally two or three times per week as needed to maintain symptom relief.  42.5 g  3  . NON FORMULARY Acid Soothe-Take 1 capsule after meals       No facility-administered medications prior to visit.   Allergies  Allergen Reactions  . Biaxin [Clarithromycin]     Chest pain  . Ceftriaxone Sodium     Anaphylaxis shock  . Sulfonamide Derivatives Nausea Only  . Aspirin     Cramps and diarrhea  . Codeine   . Rocephin [Ceftriaxone Sodium In Dextrose]       Patient Active Problem List   Diagnosis Date Noted  . NUMBNESS, HAND 03/06/2007  . HYPERLIPIDEMIA 09/28/2006  . HYPERTENSION 09/28/2006  . ALLERGIC RHINITIS 09/28/2006  . 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 GERD 09/28/2006  . IRRITABLE BOWEL SYNDROME 09/28/2006  . OSTEOARTHRITIS 09/28/2006  . ARTHRITIS 09/28/2006  . LOW BACK PAIN 09/28/2006   History  Substance Use Topics  . Smoking status: Never Smoker   . Smokeless tobacco: Never Used  . Alcohol Use: No       Objective:   Physical Exam  well-developed white female in no acute distress, pleasant blood pressure 142/84 pulse 64 height 5 foot 1 weight 167. HEENT; nontraumatic normocephalic EOMI PERRLA sclera anicteric, Supple ;no JVD, Cardiovascular ;regular rate and rhythm with S1-S2 no murmur or gallop, Pulmonary ;clear bilaterally, Abdomen; soft bowel sounds are present she is quite tender in the left lower quadrant is no guarding or rebound no palpable mass or hepatosplenomegaly, Rectal; exam not done, Extremities; no clubbing cyanosis or edema skin warm and dry, Psych ;mood and affect normal and appropriate        Assessment & Plan:  #21  63 year old female with diarrhea predominant IBS now with worsening symptoms over the past couple of weeks and 6 day history of left lower quadrant pain. I suspect she has diverticulitis #2 history of adenomatous colon polyps due for followup 2019 #3 GERD #4 hypertension  Plan; start Cipro 500 mg by mouth twice daily with food x10 days She tends to have a difficult time tolerating antibiotics will hold off on Flagyl Start Bentyl 10 mg every morning then every 6-8 hours as needed for cramping and spasm Start probiotic/Restora 1 daily over the next month She will call back next week with a progress report she will not need followup if her symptoms are resolving. If symptoms are persisting or worsening will be happy to see her back in the office.

## 2013-12-31 NOTE — Progress Notes (Signed)
Reviewed and agree with management plan. Consider Lotronex if IBS-D remains difficult to control.  Pricilla Riffle. Fuller Plan, MD [facility]

## 2014-01-07 ENCOUNTER — Telehealth: Payer: Self-pay | Admitting: Physician Assistant

## 2014-01-07 MED ORDER — CIPROFLOXACIN HCL 500 MG PO TABS: ORAL_TABLET | ORAL | Status: DC

## 2014-01-07 NOTE — Telephone Encounter (Signed)
She had been better until last night. She ate chicken and a salad last night. She began having diarrhea and cramping during the night. This AM she has had 3 diarrhea stools. She is still on antibiotics(2 1/2 days left). Please, advise.

## 2014-01-07 NOTE — Telephone Encounter (Signed)
Spoke with patient and gave her Nicoletta Ba, [place] recommendations. Rx sent to pharmacy.

## 2014-01-07 NOTE — Telephone Encounter (Signed)
I would have her take the Cipro for an additional 4 days- so 14 days total, and continue bentyl  and probiotic. Call back next week if sxs persisting when finishes the antibiotics

## 2014-01-12 ENCOUNTER — Telehealth: Payer: Self-pay | Admitting: Gastroenterology

## 2014-01-12 DIAGNOSIS — R109 Unspecified abdominal pain: Secondary | ICD-10-CM

## 2014-01-12 NOTE — Telephone Encounter (Signed)
Patient notified of the CT scheduled for 01/14/14 1:00.  She is advised to come pick up contrast and instructions today or tomorrow

## 2014-01-12 NOTE — Telephone Encounter (Signed)
If still having trouble- lets do a CT abd/pelvis with contrast to see if any sign of persistent  Diverticulitis, tell her not to worry about the color of stool

## 2014-01-12 NOTE — Telephone Encounter (Signed)
Patient reports that she has diarrhea 2-3 times a day.  She reports stools are still yellow.  She is on a bland diet.  Her stools had improved but this am she had cramping return and diarrhea.  Amy Esterwood PA please review and advise

## 2014-01-14 ENCOUNTER — Other Ambulatory Visit: Payer: BC Managed Care – PPO

## 2014-01-16 ENCOUNTER — Ambulatory Visit (INDEPENDENT_AMBULATORY_CARE_PROVIDER_SITE_OTHER)
Admission: RE | Admit: 2014-01-16 | Discharge: 2014-01-16 | Disposition: A | Discharge status: Home / Self Care | Payer: BC Managed Care – PPO | Source: Ambulatory Visit | Attending: Physician Assistant | Admitting: Physician Assistant

## 2014-01-16 DIAGNOSIS — R109 Unspecified abdominal pain: Secondary | ICD-10-CM

## 2014-01-16 MED ORDER — IOHEXOL 300 MG/ML  SOLN
100.0000 mL | Freq: Once | INTRAMUSCULAR | Status: AC | PRN
  Administered 2014-01-16: 100 mL via INTRAVENOUS

## 2014-01-19 ENCOUNTER — Inpatient Hospital Stay: Admission: RE | Admit: 2014-01-19 | Payer: BC Managed Care – PPO | Source: Ambulatory Visit

## 2014-01-23 ENCOUNTER — Ambulatory Visit: Payer: BC Managed Care – PPO | Admitting: Physician Assistant

## 2014-01-30 ENCOUNTER — Ambulatory Visit (INDEPENDENT_AMBULATORY_CARE_PROVIDER_SITE_OTHER): Payer: BC Managed Care – PPO | Admitting: Internal Medicine

## 2014-01-30 ENCOUNTER — Encounter: Payer: Self-pay | Admitting: Internal Medicine

## 2014-01-30 VITALS — BP 120/80 | HR 76 | Temp 98.6°F | Resp 16 | Wt 168.0 lb

## 2014-01-30 DIAGNOSIS — E785 Hyperlipidemia, unspecified: Secondary | ICD-10-CM

## 2014-01-30 DIAGNOSIS — J309 Allergic rhinitis, unspecified: Secondary | ICD-10-CM

## 2014-01-30 DIAGNOSIS — I10 Essential (primary) hypertension: Secondary | ICD-10-CM

## 2014-01-30 DIAGNOSIS — Z0000 Encounter for general adult medical examination without abnormal findings: Secondary | ICD-10-CM

## 2014-01-30 DIAGNOSIS — K589 Irritable bowel syndrome without diarrhea: Secondary | ICD-10-CM

## 2014-01-30 MED ORDER — CETIRIZINE HCL 10 MG PO TABS: 10.0000 mg | ORAL_TABLET | Freq: Every day | ORAL | Status: DC

## 2014-01-30 NOTE — Assessment & Plan Note (Signed)
Zyrtec  

## 2014-01-30 NOTE — Progress Notes (Signed)
   Subjective:    HPI  New pt  The patient is here for a wellness exam. The patient has been doing well overall without major physical or psychological issues going on lately.  C/o abx allergies  F/u dyslipidemia, IBS, HTN     Review of Systems  Constitutional: Negative for fever, chills, diaphoresis, activity change, appetite change, fatigue and unexpected weight change.  HENT: Negative for congestion, dental problem, ear pain, hearing loss, mouth sores, postnasal drip, sinus pressure, sneezing, sore throat and voice change.   Eyes: Negative for pain and visual disturbance.  Respiratory: Negative for cough, chest tightness, wheezing and stridor.   Cardiovascular: Negative for chest pain, palpitations and leg swelling.  Gastrointestinal: Negative for nausea, vomiting, abdominal pain, blood in stool, abdominal distention and rectal pain.  Genitourinary: Negative for dysuria, hematuria, decreased urine volume, vaginal bleeding, vaginal discharge, difficulty urinating, vaginal pain and menstrual problem.  Musculoskeletal: Negative for back pain, gait problem, joint swelling and neck pain.  Skin: Negative for color change, rash and wound.  Neurological: Negative for dizziness, tremors, syncope, speech difficulty and light-headedness.  Hematological: Negative for adenopathy.  Psychiatric/Behavioral: Negative for suicidal ideas, hallucinations, behavioral problems, confusion, sleep disturbance, dysphoric mood and decreased concentration. The patient is not hyperactive.        Objective:   Physical Exam  Constitutional: She appears well-developed. No distress.  Obese  HENT:  Head: Normocephalic.  Right Ear: External ear normal.  Left Ear: External ear normal.  Nose: Nose normal.  Mouth/Throat: Oropharynx is clear and moist.  Eyes: Conjunctivae are normal. Pupils are equal, round, and reactive to light. Right eye exhibits no discharge. Left eye exhibits no discharge.  Neck: Normal  range of motion. Neck supple. No JVD present. No tracheal deviation present. No thyromegaly present.  Cardiovascular: Normal rate, regular rhythm and normal heart sounds.   Pulmonary/Chest: No stridor. No respiratory distress. She has no wheezes.  Abdominal: Soft. Bowel sounds are normal. She exhibits no distension and no mass. There is no tenderness. There is no rebound and no guarding.  Musculoskeletal: She exhibits no edema and no tenderness.  Lymphadenopathy:    She has no cervical adenopathy.  Neurological: She displays normal reflexes. No cranial nerve deficit. She exhibits normal muscle tone. Coordination normal.  Skin: No rash noted. No erythema.  Psychiatric: She has a normal mood and affect. Her behavior is normal. Judgment and thought content normal.          Assessment & Plan:

## 2014-01-30 NOTE — Assessment & Plan Note (Signed)
ong time Bentyl

## 2014-01-30 NOTE — Assessment & Plan Note (Signed)
Continue with current prescription therapy as reflected on the Med list.  

## 2014-01-30 NOTE — Assessment & Plan Note (Signed)
Xx

## 2014-01-30 NOTE — Patient Instructions (Signed)
Gluten free trial (no wheat products) for 4-6 weeks. OK to use gluten-free bread and gluten-free pasta.  Milk free trial (no milk, ice cream, cheese and yogurt) for 4-6 weeks. OK to use almond, coconut, rice or soy milk. "Almond breeze" brand tastes good.  

## 2014-01-30 NOTE — Progress Notes (Signed)
Pre visit review using our clinic review tool, if applicable. No additional management support is needed unless otherwise documented below in the visit note. 

## 2014-01-31 ENCOUNTER — Telehealth: Payer: Self-pay | Admitting: Internal Medicine

## 2014-01-31 NOTE — Telephone Encounter (Signed)
Relevant patient education assigned to patient using Emmi. ° °

## 2014-02-06 ENCOUNTER — Other Ambulatory Visit (INDEPENDENT_AMBULATORY_CARE_PROVIDER_SITE_OTHER): Payer: BC Managed Care – PPO

## 2014-02-06 DIAGNOSIS — J309 Allergic rhinitis, unspecified: Secondary | ICD-10-CM

## 2014-02-06 DIAGNOSIS — I10 Essential (primary) hypertension: Secondary | ICD-10-CM

## 2014-02-06 DIAGNOSIS — Z0000 Encounter for general adult medical examination without abnormal findings: Secondary | ICD-10-CM

## 2014-02-06 DIAGNOSIS — E785 Hyperlipidemia, unspecified: Secondary | ICD-10-CM

## 2014-02-06 DIAGNOSIS — K589 Irritable bowel syndrome without diarrhea: Secondary | ICD-10-CM

## 2014-02-06 LAB — CBC WITH DIFFERENTIAL/PLATELET
Basophils Absolute: 0 10*3/uL (ref 0.0–0.1)
Basophils Relative: 0.5 % (ref 0.0–3.0)
Eosinophils Absolute: 0.1 10*3/uL (ref 0.0–0.7)
Eosinophils Relative: 1.6 % (ref 0.0–5.0)
HCT: 37.8 % (ref 36.0–46.0)
Hemoglobin: 12.5 g/dL (ref 12.0–15.0)
Lymphocytes Relative: 35.6 % (ref 12.0–46.0)
Lymphs Abs: 3 10*3/uL (ref 0.7–4.0)
MCHC: 33.1 g/dL (ref 30.0–36.0)
MCV: 88.2 fl (ref 78.0–100.0)
Monocytes Absolute: 0.5 10*3/uL (ref 0.1–1.0)
Monocytes Relative: 5.4 % (ref 3.0–12.0)
Neutro Abs: 4.9 10*3/uL (ref 1.4–7.7)
Neutrophils Relative %: 56.9 % (ref 43.0–77.0)
Platelets: 271 10*3/uL (ref 150.0–400.0)
RBC: 4.28 Mil/uL (ref 3.87–5.11)
RDW: 13.3 % (ref 11.5–15.5)
WBC: 8.6 10*3/uL (ref 4.0–10.5)

## 2014-02-06 LAB — LIPID PANEL
Cholesterol: 191 mg/dL (ref 0–200)
HDL: 67.9 mg/dL (ref >39.00)
LDL Cholesterol: 102 mg/dL — ABNORMAL HIGH (ref 0–99)
Total CHOL/HDL Ratio: 3
Triglycerides: 104 mg/dL (ref 0.0–149.0)
VLDL: 20.8 mg/dL (ref 0.0–40.0)

## 2014-02-06 LAB — HEPATIC FUNCTION PANEL
ALT: 25 U/L (ref 0–35)
AST: 23 U/L (ref 0–37)
Albumin: 4.1 g/dL (ref 3.5–5.2)
Alkaline Phosphatase: 55 U/L (ref 39–117)
Bilirubin, Direct: 0 mg/dL (ref 0.0–0.3)
Total Bilirubin: 0.5 mg/dL (ref 0.2–1.2)
Total Protein: 7.4 g/dL (ref 6.0–8.3)

## 2014-02-06 LAB — URINALYSIS
Bilirubin Urine: NEGATIVE
Hgb urine dipstick: NEGATIVE
Ketones, ur: NEGATIVE
Leukocytes, UA: NEGATIVE
Nitrite: NEGATIVE
Specific Gravity, Urine: 1.01 (ref 1.000–1.030)
Total Protein, Urine: NEGATIVE
Urine Glucose: NEGATIVE
Urobilinogen, UA: 0.2 (ref 0.0–1.0)
pH: 8 (ref 5.0–8.0)

## 2014-02-06 LAB — BASIC METABOLIC PANEL
BUN: 14 mg/dL (ref 6–23)
CO2: 26 mEq/L (ref 19–32)
Calcium: 9.6 mg/dL (ref 8.4–10.5)
Chloride: 104 mEq/L (ref 96–112)
Creatinine, Ser: 0.6 mg/dL (ref 0.4–1.2)
GFR: 105.75 mL/min (ref >60.00)
Glucose, Bld: 78 mg/dL (ref 70–99)
Potassium: 4.1 mEq/L (ref 3.5–5.1)
Sodium: 140 mEq/L (ref 135–145)

## 2014-02-06 LAB — TSH: TSH: 2.54 u[IU]/mL (ref 0.35–4.50)

## 2014-02-06 LAB — VITAMIN B12: Vitamin B-12: 326 pg/mL (ref 211–911)

## 2014-02-09 LAB — ALLERGEN FOOD PROFILE SPECIFIC IGE
Apple: <0.1 kU/L
Chicken IgE: <0.1 kU/L
Corn: <0.1 kU/L
Egg White IgE: <0.1 kU/L
Fish Cod: <0.1 kU/L
IgE (Immunoglobulin E), Serum: 8 IU/mL (ref 0.0–180.0)
Milk IgE: <0.1 kU/L
Orange: <0.1 kU/L
Peanut IgE: <0.1 kU/L
Shrimp IgE: <0.1 kU/L
Soybean IgE: <0.1 kU/L
Tomato IgE: <0.1 kU/L
Tuna IgE: <0.1 kU/L
Wheat IgE: <0.1 kU/L

## 2014-02-09 LAB — ALLERGY PROFILE REGION II-DC, DE, MD, ~~LOC~~, VA
Allergen, D pternoyssinus,d7: <0.1 kU/L
Alternaria Alternata: <0.1 kU/L
Aspergillus fumigatus, m3: <0.1 kU/L
Bermuda Grass: <0.1 kU/L
Box Elder IgE: <0.1 kU/L
Cat Dander: <0.1 kU/L
Cladosporium Herbarum: <0.1 kU/L
Cockroach: <0.1 kU/L
Common Ragweed: <0.1 kU/L
D. farinae: <0.1 kU/L
Dog Dander: <0.1 kU/L
Elm IgE: <0.1 kU/L
Johnson Grass: <0.1 kU/L
Lamb's Quarters: 0.13 kU/L — ABNORMAL HIGH
Meadow Grass: <0.1 kU/L
Oak: <0.1 kU/L
Pecan/Hickory Tree IgE: <0.1 kU/L

## 2014-02-13 LAB — IGG FOOD PANEL
Allergen, Milk, IgG: 10.7 ug/mL — ABNORMAL HIGH (ref <0.15)
Beef, IgG: 6.4 ug/mL — ABNORMAL HIGH (ref <2.0)
Chicken, IgG: <0.15 ug/mL (ref <0.15)
Corn, IgG: <0.15 ug/mL (ref <0.15)
Egg white, IgG: <2 ug/mL (ref <2.0)
Egg yolk, IgG: <2 ug/mL (ref <2.0)
Peanut, IgG: 0.17 ug/mL — ABNORMAL HIGH (ref <0.15)
Wheat, IgG: 0.56 ug/mL — ABNORMAL HIGH (ref <0.15)

## 2014-04-06 ENCOUNTER — Encounter: Payer: Self-pay | Admitting: Internal Medicine

## 2014-04-06 ENCOUNTER — Telehealth: Payer: Self-pay | Admitting: Internal Medicine

## 2014-04-06 ENCOUNTER — Ambulatory Visit (INDEPENDENT_AMBULATORY_CARE_PROVIDER_SITE_OTHER): Payer: BC Managed Care – PPO | Admitting: Internal Medicine

## 2014-04-06 VITALS — BP 104/80 | HR 80 | Temp 98.2°F | Resp 16 | Wt 167.0 lb

## 2014-04-06 DIAGNOSIS — H8112 Benign paroxysmal vertigo, left ear: Secondary | ICD-10-CM

## 2014-04-06 DIAGNOSIS — H8110 Benign paroxysmal vertigo, unspecified ear: Secondary | ICD-10-CM | POA: Insufficient documentation

## 2014-04-06 DIAGNOSIS — K589 Irritable bowel syndrome without diarrhea: Secondary | ICD-10-CM

## 2014-04-06 DIAGNOSIS — I10 Essential (primary) hypertension: Secondary | ICD-10-CM

## 2014-04-06 DIAGNOSIS — J309 Allergic rhinitis, unspecified: Secondary | ICD-10-CM

## 2014-04-06 MED ORDER — SILDENAFIL CITRATE 100 MG PO TABS: 100.0000 mg | ORAL_TABLET | Freq: Every day | ORAL | Status: DC | PRN

## 2014-04-06 MED ORDER — MECLIZINE HCL 12.5 MG PO TABS: 12.5000 mg | ORAL_TABLET | Freq: Three times a day (TID) | ORAL | Status: DC | PRN

## 2014-04-06 NOTE — Progress Notes (Signed)
Pre visit review using our clinic review tool, if applicable. No additional management support is needed unless otherwise documented below in the visit note. 

## 2014-04-06 NOTE — Assessment & Plan Note (Signed)
IBS-D Better on milk-free and gluten-free diet

## 2014-04-06 NOTE — Assessment & Plan Note (Signed)
Continue with current prescription therapy as reflected on the Med list.  

## 2014-04-06 NOTE — Telephone Encounter (Signed)
Pharmacy notified that rx for Viagra ordered in error

## 2014-04-06 NOTE — Assessment & Plan Note (Signed)
Better  

## 2014-04-06 NOTE — Telephone Encounter (Signed)
Kim from [facility] is calling on a prescription that was sent electronically to them for the patient for Viagra. They need to verify if this is correct. Please call [facility].

## 2014-04-06 NOTE — Assessment & Plan Note (Signed)
Benign Positional Vertigo symptoms on the left. Start Meclizine. Start Brandt - Daroff exercise several times a day as dirrected. 

## 2014-04-06 NOTE — Progress Notes (Signed)
   Subjective:    HPI   C/o sudden vertigo on Fri, had to go backwards and fell C/o R ear stopped up   C/o abx allergies  F/u dyslipidemia, IBS, HTN     Review of Systems  Constitutional: Negative for fever, chills, diaphoresis, activity change, appetite change, fatigue and unexpected weight change.  HENT: Negative for congestion, dental problem, ear pain, hearing loss, mouth sores, postnasal drip, sinus pressure, sneezing, sore throat and voice change.   Eyes: Negative for pain and visual disturbance.  Respiratory: Negative for cough, chest tightness, wheezing and stridor.   Cardiovascular: Negative for chest pain, palpitations and leg swelling.  Gastrointestinal: Negative for nausea, vomiting, abdominal pain, blood in stool, abdominal distention and rectal pain.  Genitourinary: Negative for dysuria, hematuria, decreased urine volume, vaginal bleeding, vaginal discharge, difficulty urinating, vaginal pain and menstrual problem.  Musculoskeletal: Negative for back pain, gait problem, joint swelling and neck pain.  Skin: Negative for color change, rash and wound.  Neurological: Negative for dizziness, tremors, syncope, speech difficulty and light-headedness.  Hematological: Negative for adenopathy.  Psychiatric/Behavioral: Negative for suicidal ideas, hallucinations, behavioral problems, confusion, sleep disturbance, dysphoric mood and decreased concentration. The patient is not hyperactive.        Objective:   Physical Exam  Constitutional: She appears well-developed. No distress.  Obese  HENT:  Head: Normocephalic.  Right Ear: External ear normal.  Left Ear: External ear normal.  Nose: Nose normal.  Mouth/Throat: Oropharynx is clear and moist.  Eyes: Conjunctivae are normal. Pupils are equal, round, and reactive to light. Right eye exhibits no discharge. Left eye exhibits no discharge.  Neck: Normal range of motion. Neck supple. No JVD present. No tracheal deviation  present. No thyromegaly present.  Cardiovascular: Normal rate, regular rhythm and normal heart sounds.   Pulmonary/Chest: No stridor. No respiratory distress. She has no wheezes.  Abdominal: Soft. Bowel sounds are normal. She exhibits no distension and no mass. There is no tenderness. There is no rebound and no guarding.  Musculoskeletal: She exhibits no edema and no tenderness.  Lymphadenopathy:    She has no cervical adenopathy.  Neurological: She displays normal reflexes. No cranial nerve deficit. She exhibits normal muscle tone. Coordination normal.  Skin: No rash noted. No erythema.  Psychiatric: She has a normal mood and affect. Her behavior is normal. Judgment and thought content normal.  TMs w/scar tissue  H-P (+) on L      Assessment & Plan:

## 2014-04-06 NOTE — Patient Instructions (Signed)
Benign Positional Vertigo symptoms on the left. Start Meclizine. Start Brandt - Daroff exercise several times a day as dirrected. 

## 2014-04-27 ENCOUNTER — Ambulatory Visit (INDEPENDENT_AMBULATORY_CARE_PROVIDER_SITE_OTHER): Payer: BC Managed Care – PPO | Admitting: Nurse Practitioner

## 2014-04-27 ENCOUNTER — Encounter: Payer: Self-pay | Admitting: Nurse Practitioner

## 2014-04-27 VITALS — BP 130/78 | HR 72 | Ht 61.0 in | Wt 170.0 lb

## 2014-04-27 DIAGNOSIS — Z0000 Encounter for general adult medical examination without abnormal findings: Secondary | ICD-10-CM

## 2014-04-27 DIAGNOSIS — Z01419 Encounter for gynecological examination (general) (routine) without abnormal findings: Secondary | ICD-10-CM

## 2014-04-27 LAB — POCT URINALYSIS DIPSTICK
Bilirubin, UA: NEGATIVE
Blood, UA: NEGATIVE
Glucose, UA: NEGATIVE
Ketones, UA: NEGATIVE
Leukocytes, UA: NEGATIVE
Nitrite, UA: NEGATIVE
Protein, UA: NEGATIVE
Urobilinogen, UA: NEGATIVE
pH, UA: 6

## 2014-04-27 NOTE — Patient Instructions (Signed)

## 2014-04-27 NOTE — Progress Notes (Signed)
63 y.o. [id number] Married Caucasian Fe here for annual exam.  No recent problems other than 2 colon polyps that were pre cancer.  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 She had a recent flare of vertigo that has cleared.  Patient's last menstrual period was 11/24/1982.          Sexually active: Yes.    The current method of family planning is status post hysterectomy.    Exercising: Yes.    yoga, circuit machines, walking  Smoker:  no  Health Maintenance: Pap:  04/20/2011 HR HPV Neg MMG:  10/24/13 BI-RADS Neg Colonoscopy:  02/2014 2 pre cancer polyps BMD:   02/2013 Normal TDaP:  2014 Labs: PCP maintain all labs   UA: Negative   reports that she has never smoked. She has never used smokeless tobacco. She reports that she does not drink alcohol or use illicit drugs.  Past Medical History  Diagnosis Date  . Hyperlipidemia   . Hypertension   . Allergy   . Bulging disc 10/2012    CERVICAL  . GERD (gastroesophageal reflux disease)   . Atrophic vaginitis   . Endometriosis   . IBS (irritable bowel syndrome)   . Bursitis of hip     Past Surgical History  Procedure Laterality Date  . Appendectomy  1/72  . Tmj arthroplasty  6/94    secondary to hockey injury 15 years earlier  . Bunionectomy Left 04/2004    left foot  . Tubal ligation Bilateral 5/72  . Laparoscopic cholecystectomy  02/2005  . Hysterotomy  5/72    fetal demise, also BTL  . Abdominal hysterectomy  11/1982    AUB & endometriosis, ovaries remain    Current Outpatient Prescriptions  Medication Sig Dispense Refill  . calcium carbonate (OS-CAL) 600 MG TABS Take 600 mg by mouth 2 (two) times daily with a meal.      . cetirizine (ZYRTEC) 10 MG tablet Take 1 tablet (10 mg total) by mouth daily.  100 tablet  3  . Cholecalciferol (VITAMIN D-3) 1000 UNITS CAPS Take by mouth daily.      [name] dicyclomine (BENTYL) 10 MG capsule Take 1 tab in the morning then every 6-8 hours.  90 capsule   1  . esomeprazole (NEXIUM) 40 MG capsule Take 40 mg by mouth daily at 12 noon.      . fluticasone (FLONASE) 50 MCG/ACT nasal spray Place into both nostrils daily.      . halobetasol (ULTRAVATE) 0.05 % ointment       . losartan-hydrochlorothiazide (HYZAAR) 100-12.5 MG per tablet Take 1 tablet by mouth daily.      . meclizine (ANTIVERT) 12.5 MG tablet Take 1 tablet (12.5 mg total) by mouth 3 (three) times daily as needed for dizziness.  60 tablet  1  . Omega-3 Fatty Acids (FISH OIL) 1200 MG CAPS Take 1,400 mg by mouth daily.      [name] OVER THE COUNTER MEDICATION Take 800 mg by mouth 2 (two) times daily. Holy Basil      . simvastatin (ZOCOR) 40 MG tablet Take 40 mg by mouth every evening.       No current facility-administered medications for this visit.    Family History  Problem Relation Age of Onset  . Diverticulitis Mother   . Breast cancer Mother 66  . Hypertension Mother   . Heart disease Mother   . Osteoarthritis Mother   . Heart disease Brother   .  Hypertension Brother   . Lung cancer Brother 47    lung cancer, MI, ETOH  . Heart disease Sister   . Hypertension Sister   . Osteoarthritis Sister   . Hypertension Maternal Uncle   . Heart disease Maternal Uncle   . Hypertension Maternal Grandfather   . Heart disease Maternal Grandfather   . Hypertension Father     ROS:  Pertinent items are noted in HPI.  Otherwise, a comprehensive ROS was negative.  Exam:   BP 130/78  Pulse 72  Ht 5\' 1"  (1.549 m)  Wt 170 lb (77.111 kg)  BMI 32.14 kg/m2  LMP 11/24/1982 Height: 5\' 1"  (154.9 cm)  Ht Readings from Last 3 Encounters:  04/27/14 5\' 1"  (1.549 m)  12/31/13 5\' 1"  (1.549 m)  04/24/13 5' 0.25" (1.53 m)    General appearance: alert, cooperative and appears stated age Head: Normocephalic, without obvious abnormality, atraumatic Neck: no adenopathy, supple, symmetrical, trachea midline and thyroid normal to inspection and palpation Lungs: clear to auscultation bilaterally Breasts:  normal appearance, no masses or tenderness Heart: regular rate and rhythm Abdomen: soft, non-tender; no masses,  no organomegaly Extremities: extremities normal, atraumatic, no cyanosis or edema Skin: Skin color, texture, turgor normal. No rashes or lesions Lymph nodes: Cervical, supraclavicular, and axillary nodes normal. No abnormal inguinal nodes palpated Neurologic: Grossly normal   Pelvic: External genitalia:  no lesions              Urethra:  normal appearing urethra with no masses, tenderness or lesions              Bartholin's and Skene's: normal                 Vagina: normal appearing vagina with normal color and discharge, no lesions              Cervix: absent              Pap taken: Yes.   Bimanual Exam:  Uterus:  uterus absent              Adnexa: no mass, fullness, tenderness               Rectovaginal: Confirms               Anus:  normal sphincter tone, no lesions  A:  Well Woman with normal exam  S/P TAH secondary to endometriosis 1984  History of HTN, GERD, vertigo  P:   Reviewed health and wellness pertinent to exam  Pap smear taken today  Mammogram is due 09/2014   Counseled on breast self exam, mammography screening, adequate intake of calcium and vitamin D, diet and exercise return annually or prn  An After Visit Summary was printed and given to the patient.

## 2014-04-28 LAB — IPS PAP TEST WITH REFLEX TO HPV

## 2014-05-03 NOTE — Progress Notes (Signed)
Encounter reviewed by Dr. Brook Silva.  

## 2014-05-04 ENCOUNTER — Ambulatory Visit (INDEPENDENT_AMBULATORY_CARE_PROVIDER_SITE_OTHER): Payer: BC Managed Care – PPO | Admitting: Internal Medicine

## 2014-05-04 ENCOUNTER — Encounter: Payer: Self-pay | Admitting: Internal Medicine

## 2014-05-04 VITALS — BP 142/85 | HR 81 | Temp 98.3°F | Wt 169.0 lb

## 2014-05-04 DIAGNOSIS — M545 Low back pain, unspecified: Secondary | ICD-10-CM

## 2014-05-04 DIAGNOSIS — H8110 Benign paroxysmal vertigo, unspecified ear: Secondary | ICD-10-CM

## 2014-05-04 DIAGNOSIS — I10 Essential (primary) hypertension: Secondary | ICD-10-CM

## 2014-05-04 DIAGNOSIS — E785 Hyperlipidemia, unspecified: Secondary | ICD-10-CM

## 2014-05-04 DIAGNOSIS — K589 Irritable bowel syndrome without diarrhea: Secondary | ICD-10-CM

## 2014-05-04 MED ORDER — LOSARTAN POTASSIUM-HCTZ 100-12.5 MG PO TABS: 1.0000 | ORAL_TABLET | Freq: Every day | ORAL | Status: DC

## 2014-05-04 MED ORDER — SIMVASTATIN 40 MG PO TABS: 40.0000 mg | ORAL_TABLET | Freq: Every evening | ORAL | Status: DC

## 2014-05-04 NOTE — Progress Notes (Signed)
   Subjective:    HPI   F/u sudden vertigo on Fri, R ear stopped up off and on - resolved   C/o abx allergies  F/u dyslipidemia, IBS, HTN     Review of Systems  Constitutional: Negative for fever, chills, diaphoresis, activity change, appetite change, fatigue and unexpected weight change.  HENT: Negative for congestion, dental problem, ear pain, hearing loss, mouth sores, postnasal drip, sinus pressure, sneezing, sore throat and voice change.   Eyes: Negative for pain and visual disturbance.  Respiratory: Negative for cough, chest tightness, wheezing and stridor.   Cardiovascular: Negative for chest pain, palpitations and leg swelling.  Gastrointestinal: Negative for nausea, vomiting, abdominal pain, blood in stool, abdominal distention and rectal pain.  Genitourinary: Negative for dysuria, hematuria, decreased urine volume, vaginal bleeding, vaginal discharge, difficulty urinating, vaginal pain and menstrual problem.  Musculoskeletal: Negative for back pain, gait problem, joint swelling and neck pain.  Skin: Negative for color change, rash and wound.  Neurological: Negative for dizziness, tremors, syncope, speech difficulty and light-headedness.  Hematological: Negative for adenopathy.  Psychiatric/Behavioral: Negative for suicidal ideas, hallucinations, behavioral problems, confusion, sleep disturbance, dysphoric mood and decreased concentration. The patient is not hyperactive.        Objective:   Physical Exam  Constitutional: She appears well-developed. No distress.  Obese  HENT:  Head: Normocephalic.  Right Ear: External ear normal.  Left Ear: External ear normal.  Nose: Nose normal.  Mouth/Throat: Oropharynx is clear and moist.  Eyes: Conjunctivae are normal. Pupils are equal, round, and reactive to light. Right eye exhibits no discharge. Left eye exhibits no discharge.  Neck: Normal range of motion. Neck supple. No JVD present. No tracheal deviation present. No  thyromegaly present.  Cardiovascular: Normal rate, regular rhythm and normal heart sounds.   Pulmonary/Chest: No stridor. No respiratory distress. She has no wheezes.  Abdominal: Soft. Bowel sounds are normal. She exhibits no distension and no mass. There is no tenderness. There is no rebound and no guarding.  Musculoskeletal: She exhibits no edema and no tenderness.  Lymphadenopathy:    She has no cervical adenopathy.  Neurological: She displays normal reflexes. No cranial nerve deficit. She exhibits normal muscle tone. Coordination normal.  Skin: No rash noted. No erythema.  Psychiatric: She has a normal mood and affect. Her behavior is normal. Judgment and thought content normal.  TMs w/scar tissue   Lab Results  Component Value Date   WBC 8.6 02/06/2014   HGB 12.5 02/06/2014   HCT 37.8 02/06/2014   PLT 271.0 02/06/2014   GLUCOSE 78 02/06/2014   CHOL 191 02/06/2014   TRIG 104.0 02/06/2014   HDL 67.90 02/06/2014   LDLCALC 102* 02/06/2014   ALT 25 02/06/2014   AST 23 02/06/2014   NA 140 02/06/2014   K 4.1 02/06/2014   CL 104 02/06/2014   CREATININE 0.6 02/06/2014   BUN 14 02/06/2014   CO2 26 02/06/2014   TSH 2.54 02/06/2014        Assessment & Plan:

## 2014-05-04 NOTE — Assessment & Plan Note (Signed)
Continue with current prescription therapy as reflected on the Med list.  

## 2014-05-04 NOTE — Assessment & Plan Note (Signed)
Resolved

## 2014-05-04 NOTE — Progress Notes (Signed)
Pre visit review using our clinic review tool, if applicable. No additional management support is needed unless otherwise documented below in the visit note. 

## 2014-07-06 ENCOUNTER — Ambulatory Visit (INDEPENDENT_AMBULATORY_CARE_PROVIDER_SITE_OTHER): Payer: BC Managed Care – PPO | Admitting: Nurse Practitioner

## 2014-07-06 ENCOUNTER — Encounter: Payer: Self-pay | Admitting: Nurse Practitioner

## 2014-07-06 VITALS — BP 124/80 | HR 68 | Ht 61.0 in | Wt 166.0 lb

## 2014-07-06 DIAGNOSIS — N760 Acute vaginitis: Secondary | ICD-10-CM

## 2014-07-06 DIAGNOSIS — N952 Postmenopausal atrophic vaginitis: Secondary | ICD-10-CM

## 2014-07-06 DIAGNOSIS — B9689 Other specified bacterial agents as the cause of diseases classified elsewhere: Secondary | ICD-10-CM

## 2014-07-06 DIAGNOSIS — A499 Bacterial infection, unspecified: Secondary | ICD-10-CM

## 2014-07-06 MED ORDER — METRONIDAZOLE 0.75 % VA GEL: 1.0000 | Freq: Every day | VAGINAL | Status: DC

## 2014-07-06 MED ORDER — ESTROGENS, CONJUGATED 0.625 MG/GM VA CREA: 1.0000 | TOPICAL_CREAM | Freq: Every day | VAGINAL | Status: DC

## 2014-07-06 MED ORDER — ESTROGENS, CONJUGATED 0.625 MG/GM VA CREA: TOPICAL_CREAM | VAGINAL | Status: DC

## 2014-07-06 NOTE — Progress Notes (Signed)
Subjective:     Patient ID: Jennifer Davenport, female   DOB: 09-Oct-1950, 63 y.o.   MRN: [id number]  HPI   This 63 yo WM Fe complains of vaginitis symptoms for about 6 weeks.  Mainly has been dryness with itching at tines and some burning feeling.  She has not been on vaginal estrogen secondary to the cost.  She has tried coconut oil externally,  and Clobetasol with minimal help.  She continues to have dyspareunia.  Denies urinary symptoms, fever, chills.  No prior history of LSA.   Review of Systems  Constitutional: Negative for fever, chills and fatigue.  Genitourinary: Positive for vaginal pain and dyspareunia. Negative for dysuria, urgency, frequency, flank pain, decreased urine volume, vaginal bleeding, vaginal discharge and pelvic pain.  Musculoskeletal: Negative.   Skin: Negative.   Neurological: Negative.   Psychiatric/Behavioral: Negative.        Objective:   Physical Exam  Constitutional: She is oriented to person, place, and time. She appears well-developed and well-nourished.  Abdominal: Soft. She exhibits no distension. There is no tenderness. There is no rebound and no guarding.  Genitourinary:  External vulvar areas are very atrophic. She has very little vaginal discharge and taken from the side wall is a bright yellow discharge.  Wet prep: PH: 5.5; NSS few clue cell, and atrophic changes; KOH: negative.  Neurological: She is alert and oriented to person, place, and time.  Psychiatric: She has a normal mood and affect. Her behavior is normal. Judgment and thought content normal.       Assessment:     Atrophic vaginitis off vaginal estrogen BV    Plan:     Will have her to restart Premarin vaginal cream three times a week initially then reduce to twice weekly Metrogel vaginal cream HS X 5 CB if she gets a yeast infection

## 2014-07-06 NOTE — Patient Instructions (Addendum)
Atrophic Vaginitis Atrophic vaginitis is a problem of low levels of estrogen in women. This problem can happen at any age. It is most common in women who have gone through menopause ("the change").  HOW WILL I KNOW IF I HAVE THIS PROBLEM? You may have:  Trouble with peeing (urinating), such as:  Going to the bathroom often.  A hard time holding your pee until you reach a bathroom.  Leaking pee.  Having pain when you pee.  Itching or a burning feeling.  Vaginal bleeding and spotting.  Pain during sex.  Dryness of the vagina.  A yellow, bad-smelling fluid (discharge) coming from the vagina. HOW WILL MY DOCTOR CHECK FOR THIS PROBLEM?  During your exam, your doctor will likely find the problem.  If there is a vaginal fluid, it may be checked for infection. HOW WILL THIS PROBLEM BE TREATED? Keep the vulvar skin as clean as possible. Moisturizers and lubricants can help with some of the symptoms. Estrogen replacement can help. There are 2 ways to take estrogen:  Systemic estrogen gets estrogen to your whole body. It takes many weeks or months before the symptoms get better.  You take an estrogen pill.  You use a skin patch. This is a patch that you put on your skin.  If you still have your uterus, your doctor may ask you to take a hormone. Talk to your doctor about the right medicine for you.  Estrogen cream.  This puts estrogen only at the part of your body where you apply it. The cream is put into the vagina or put on the vulvar skin. For some women, estrogen cream works faster than pills or the patch. CAN ALL WOMEN WITH THIS PROBLEM USE ESTROGEN? No. Women with certain types of cancer, liver problems, or problems with blood clots should not take estrogen. Your doctor can help you decide the best treatment for your symptoms. Document Released: 02/28/2008 Document Revised: 09/16/2013 Document Reviewed: 02/28/2008 [facility] Patient Information 2015 [place], [place]. This  information is not intended to replace advice given to you by your health care provider. Make sure you discuss any questions you have with your health care provider.   Bacterial Vaginosis Bacterial vaginosis is a vaginal infection that occurs when the normal balance of bacteria in the vagina is disrupted. It results from an overgrowth of certain bacteria. This is the most common vaginal infection in women of childbearing age. Treatment is important to prevent complications, especially in pregnant women, as it can cause a premature delivery. CAUSES  Bacterial vaginosis is caused by an increase in harmful bacteria that are normally present in smaller amounts in the vagina. Several different kinds of bacteria can cause bacterial vaginosis. However, the reason that the condition develops is not fully understood. RISK FACTORS Certain activities or behaviors can put you at an increased risk of developing bacterial vaginosis, including:  Having a new sex partner or multiple sex partners.  Douching.  Using an intrauterine device (IUD) for contraception. Women do not get bacterial vaginosis from toilet seats, bedding, swimming pools, or contact with objects around them. SIGNS AND SYMPTOMS  Some women with bacterial vaginosis have no signs or symptoms. Common symptoms include:  Grey vaginal discharge.  A fishlike odor with discharge, especially after sexual intercourse.  Itching or burning of the vagina and vulva.  Burning or pain with urination. DIAGNOSIS  Your health care provider will take a medical history and examine the vagina for signs of bacterial vaginosis. A sample of vaginal  fluid may be taken. Your health care provider will look at this sample under a microscope to check for bacteria and abnormal cells. A vaginal pH test may also be done.  TREATMENT  Bacterial vaginosis may be treated with antibiotic medicines. These may be given in the form of a pill or a vaginal cream. A second round  of antibiotics may be prescribed if the condition comes back after treatment.  HOME CARE INSTRUCTIONS   Only take over-the-counter or prescription medicines as directed by your health care provider.  If antibiotic medicine was prescribed, take it as directed. Make sure you finish it even if you start to feel better.  Do not have sex until treatment is completed.  Tell all sexual partners that you have a vaginal infection. They should see their health care provider and be treated if they have problems, such as a mild rash or itching.  Practice safe sex by using condoms and only having one sex partner. SEEK MEDICAL CARE IF:   Your symptoms are not improving after 3 days of treatment.  You have increased discharge or pain.  You have a fever. MAKE SURE YOU:   Understand these instructions.  Will watch your condition.  Will get help right away if you are not doing well or get worse. FOR MORE INFORMATION  Centers for Disease Control and Prevention, Division of STD Prevention: AppraiserFraud.fi American Sexual Health Association (ASHA): www.ashastd.org  Document Released: 09/11/2005 Document Revised: 07/02/2013 Document Reviewed: 04/23/2013 [facility] Patient Information 2015 [place], [place]. This information is not intended to replace advice given to you by your health care provider. Make sure you discuss any questions you have with your health care provider.

## 2014-07-07 ENCOUNTER — Telehealth: Payer: Self-pay | Admitting: Nurse Practitioner

## 2014-07-07 MED ORDER — ESTROGENS, CONJUGATED 0.625 MG/GM VA CREA: TOPICAL_CREAM | VAGINAL | Status: DC

## 2014-07-07 NOTE — Telephone Encounter (Signed)
Premarin 0.625 mg/gram Cream

## 2014-07-07 NOTE — Telephone Encounter (Signed)
Spoke with patient.  She would like to have rx to order estrogen at [facility] as she has discussed with Milford Cage, FNP. She has high cost deductible plan $5,000.00 and Premarin will cost $213.00  Advised review with provider and return call.   Patty, I am waiting for Dr. Sabra Heck to advise.  Since patient has not had documented failure to Premarin or Estrace.  Will need rx that is not equivalent to Premarin or estrace in order for it to be compounded at [facility].

## 2014-07-07 NOTE — Telephone Encounter (Signed)
Pt states that this rx needs to be fixed per pharmacy. It needs to say the % for the each one of medication to make the cream.  conjugated estrogens (PREMARIN) vaginal cream  Use 1/2 g vaginally three times a week, Print, Last Dose: Not Recorded  Refills: 3 ordered Pharmacy: [facility], [place]

## 2014-07-08 NOTE — Progress Notes (Signed)
Encounter reviewed by Dr. Brook Silva.  

## 2014-07-14 NOTE — Telephone Encounter (Signed)
Dr. Sabra Heck and Dr. Quincy Simmonds will get together with [place] and advise.  We do not want to be ordering something that would put patient at risk.

## 2014-07-14 NOTE — Telephone Encounter (Signed)
Patty reviewing at this time with pharmacist.

## 2014-07-14 NOTE — Telephone Encounter (Signed)
Pt is still waiting on a response from the nurse regarding the premarin cream.

## 2014-07-15 NOTE — Telephone Encounter (Signed)
Called patient and update given. Advised we are reviewing with supervisory physicians.  Advised we will call back with update.  Patient agreeable.   [place], test claim with discount card, for 30 grams is $208.36

## 2014-07-22 NOTE — Telephone Encounter (Signed)
Patty do you have an update for this patient?

## 2014-07-22 NOTE — Telephone Encounter (Signed)
OKay to close? °

## 2014-07-24 NOTE — Telephone Encounter (Signed)
OK to prescribe estradiol 0.01% in HRT replacement cream.  Apply 64ml vaginally twice weekly.  #30 grams/3RF.  Psychologist, occupational or [facility].  Thanks.

## 2014-07-26 NOTE — Telephone Encounter (Signed)
See last note from Dr. Sabra Heck

## 2014-07-27 ENCOUNTER — Encounter: Payer: Self-pay | Admitting: Nurse Practitioner

## 2014-07-27 ENCOUNTER — Other Ambulatory Visit: Payer: Self-pay | Admitting: Nurse Practitioner

## 2014-07-27 MED ORDER — NONFORMULARY OR COMPOUNDED ITEM: Status: DC

## 2014-07-28 NOTE — Telephone Encounter (Signed)
Spoke with Jennifer Davenport at [facility]. Jennifer Davenport would like to verify order for estradiol. Advised per Dr.Miller okay to prescribe estradiol 0.01 % apply 32ml vaginally twice weekly. #30 grams/3RF. Jennifer Davenport is agreeable and verbalizes understanding.  Routing to provider for final review. Patient agreeable to disposition. Will close encounter

## 2014-09-29 ENCOUNTER — Other Ambulatory Visit: Payer: Self-pay

## 2014-09-29 DIAGNOSIS — Z1231 Encounter for screening mammogram for malignant neoplasm of breast: Secondary | ICD-10-CM

## 2014-10-26 ENCOUNTER — Ambulatory Visit
Admission: RE | Admit: 2014-10-26 | Discharge: 2014-10-26 | Disposition: A | Discharge status: Home / Self Care | Payer: BLUE CROSS/BLUE SHIELD | Source: Ambulatory Visit

## 2014-10-26 DIAGNOSIS — Z1231 Encounter for screening mammogram for malignant neoplasm of breast: Secondary | ICD-10-CM

## 2015-02-03 ENCOUNTER — Encounter: Payer: BC Managed Care – PPO | Admitting: Internal Medicine

## 2015-02-11 ENCOUNTER — Other Ambulatory Visit (INDEPENDENT_AMBULATORY_CARE_PROVIDER_SITE_OTHER): Payer: BLUE CROSS/BLUE SHIELD

## 2015-02-11 ENCOUNTER — Encounter: Payer: Self-pay | Admitting: Internal Medicine

## 2015-02-11 ENCOUNTER — Ambulatory Visit (INDEPENDENT_AMBULATORY_CARE_PROVIDER_SITE_OTHER): Payer: BLUE CROSS/BLUE SHIELD | Admitting: Internal Medicine

## 2015-02-11 VITALS — BP 134/80 | HR 73 | Temp 98.0°F | Ht 61.0 in | Wt 168.0 lb

## 2015-02-11 DIAGNOSIS — Z0000 Encounter for general adult medical examination without abnormal findings: Secondary | ICD-10-CM | POA: Diagnosis not present

## 2015-02-11 LAB — URINALYSIS
Bilirubin Urine: NEGATIVE
Hgb urine dipstick: NEGATIVE
Ketones, ur: NEGATIVE
Leukocytes, UA: NEGATIVE
Nitrite: NEGATIVE
Specific Gravity, Urine: 1.015 (ref 1.000–1.030)
Total Protein, Urine: NEGATIVE
Urine Glucose: NEGATIVE
Urobilinogen, UA: 0.2 (ref 0.0–1.0)
pH: 7 (ref 5.0–8.0)

## 2015-02-11 LAB — BASIC METABOLIC PANEL
BUN: 12 mg/dL (ref 6–23)
CO2: 28 mEq/L (ref 19–32)
Calcium: 9.9 mg/dL (ref 8.4–10.5)
Chloride: 102 mEq/L (ref 96–112)
Creatinine, Ser: 0.67 mg/dL (ref 0.40–1.20)
GFR: 94.59 mL/min (ref >60.00)
Glucose, Bld: 90 mg/dL (ref 70–99)
Potassium: 3.8 mEq/L (ref 3.5–5.1)
Sodium: 137 mEq/L (ref 135–145)

## 2015-02-11 LAB — HEPATIC FUNCTION PANEL
ALT: 23 U/L (ref 0–35)
AST: 21 U/L (ref 0–37)
Albumin: 4.5 g/dL (ref 3.5–5.2)
Alkaline Phosphatase: 55 U/L (ref 39–117)
Bilirubin, Direct: 0.1 mg/dL (ref 0.0–0.3)
Total Bilirubin: 0.5 mg/dL (ref 0.2–1.2)
Total Protein: 7.3 g/dL (ref 6.0–8.3)

## 2015-02-11 LAB — LIPID PANEL
Cholesterol: 179 mg/dL (ref 0–200)
HDL: 60.5 mg/dL (ref >39.00)
LDL Cholesterol: 91 mg/dL (ref 0–99)
NonHDL: 118.5
Total CHOL/HDL Ratio: 3
Triglycerides: 136 mg/dL (ref 0.0–149.0)
VLDL: 27.2 mg/dL (ref 0.0–40.0)

## 2015-02-11 LAB — CBC WITH DIFFERENTIAL/PLATELET
Basophils Absolute: 0 10*3/uL (ref 0.0–0.1)
Basophils Relative: 0.6 % (ref 0.0–3.0)
Eosinophils Absolute: 0.1 10*3/uL (ref 0.0–0.7)
Eosinophils Relative: 2 % (ref 0.0–5.0)
HCT: 38.4 % (ref 36.0–46.0)
Hemoglobin: 13 g/dL (ref 12.0–15.0)
Lymphocytes Relative: 34 % (ref 12.0–46.0)
Lymphs Abs: 2.2 10*3/uL (ref 0.7–4.0)
MCHC: 33.8 g/dL (ref 30.0–36.0)
MCV: 85.8 fl (ref 78.0–100.0)
Monocytes Absolute: 0.4 10*3/uL (ref 0.1–1.0)
Monocytes Relative: 6.4 % (ref 3.0–12.0)
Neutro Abs: 3.7 10*3/uL (ref 1.4–7.7)
Neutrophils Relative %: 57 % (ref 43.0–77.0)
Platelets: 270 10*3/uL (ref 150.0–400.0)
RBC: 4.47 Mil/uL (ref 3.87–5.11)
RDW: 13.5 % (ref 11.5–15.5)
WBC: 6.4 10*3/uL (ref 4.0–10.5)

## 2015-02-11 LAB — TSH: TSH: 1.74 u[IU]/mL (ref 0.35–4.50)

## 2015-02-11 MED ORDER — LOSARTAN POTASSIUM-HCTZ 100-12.5 MG PO TABS: 1.0000 | ORAL_TABLET | Freq: Every day | ORAL | Status: DC

## 2015-02-11 MED ORDER — SIMVASTATIN 40 MG PO TABS: 40.0000 mg | ORAL_TABLET | Freq: Every evening | ORAL | Status: DC

## 2015-02-11 NOTE — Progress Notes (Signed)
Pre visit review using our clinic review tool, if applicable. No additional management support is needed unless otherwise documented below in the visit note. 

## 2015-02-11 NOTE — Assessment & Plan Note (Signed)
We discussed age appropriate health related issues, including available/recomended screening tests and vaccinations. We discussed a need for adhering to healthy diet and exercise. Labs/EKG were reviewed/ordered. All questions were answered. Labs Zostavax discussed

## 2015-02-11 NOTE — Progress Notes (Signed)
   Subjective:    HPI   The patient is here for a wellness exam. The patient has been doing well overall without major physical or psychological issues going on lately. Pt is on gluten free and dairy free diet   F/u abx allergies  F/u dyslipidemia, IBS, HTN   Wt Readings from Last 3 Encounters:  02/11/15 168 lb (76.204 kg)  07/06/14 166 lb (75.297 kg)  05/04/14 169 lb (76.658 kg)   BP Readings from Last 3 Encounters:  02/11/15 134/80  07/06/14 124/80  05/04/14 142/85      Review of Systems  Constitutional: Negative for fever, chills, diaphoresis, activity change, appetite change, fatigue and unexpected weight change.  HENT: Negative for congestion, dental problem, ear pain, hearing loss, mouth sores, postnasal drip, sinus pressure, sneezing, sore throat and voice change.   Eyes: Negative for pain and visual disturbance.  Respiratory: Negative for cough, chest tightness, wheezing and stridor.   Cardiovascular: Negative for chest pain, palpitations and leg swelling.  Gastrointestinal: Negative for nausea, vomiting, abdominal pain, blood in stool, abdominal distention and rectal pain.  Genitourinary: Negative for dysuria, hematuria, decreased urine volume, vaginal bleeding, vaginal discharge, difficulty urinating, vaginal pain and menstrual problem.  Musculoskeletal: Negative for back pain, joint swelling, gait problem and neck pain.  Skin: Negative for color change, rash and wound.  Neurological: Negative for dizziness, tremors, syncope, speech difficulty and light-headedness.  Hematological: Negative for adenopathy.  Psychiatric/Behavioral: Negative for suicidal ideas, hallucinations, behavioral problems, confusion, sleep disturbance, dysphoric mood and decreased concentration. The patient is not hyperactive.        Objective:   Physical Exam  Constitutional: She appears well-developed. No distress.  Obese  HENT:  Head: Normocephalic.  Right Ear: External ear normal.    Left Ear: External ear normal.  Nose: Nose normal.  Mouth/Throat: Oropharynx is clear and moist.  Eyes: Conjunctivae are normal. Pupils are equal, round, and reactive to light. Right eye exhibits no discharge. Left eye exhibits no discharge.  Neck: Normal range of motion. Neck supple. No JVD present. No tracheal deviation present. No thyromegaly present.  Cardiovascular: Normal rate, regular rhythm and normal heart sounds.   Pulmonary/Chest: No stridor. No respiratory distress. She has no wheezes.  Abdominal: Soft. Bowel sounds are normal. She exhibits no distension and no mass. There is no tenderness. There is no rebound and no guarding.  Musculoskeletal: She exhibits no edema or tenderness.  Lymphadenopathy:    She has no cervical adenopathy.  Neurological: She displays normal reflexes. No cranial nerve deficit. She exhibits normal muscle tone. Coordination normal.  Skin: No rash noted. No erythema.  Psychiatric: She has a normal mood and affect. Her behavior is normal. Judgment and thought content normal.  TMs w/scar tissue Droopy eyelids   Lab Results  Component Value Date   WBC 8.6 02/06/2014   HGB 12.5 02/06/2014   HCT 37.8 02/06/2014   PLT 271.0 02/06/2014   GLUCOSE 78 02/06/2014   CHOL 191 02/06/2014   TRIG 104.0 02/06/2014   HDL 67.90 02/06/2014   LDLCALC 102* 02/06/2014   ALT 25 02/06/2014   AST 23 02/06/2014   NA 140 02/06/2014   K 4.1 02/06/2014   CL 104 02/06/2014   CREATININE 0.6 02/06/2014   BUN 14 02/06/2014   CO2 26 02/06/2014   TSH 2.54 02/06/2014        Assessment & Plan:

## 2015-02-12 LAB — VARICELLA ZOSTER ANTIBODY, IGG: Varicella IgG: 3748 Index — ABNORMAL HIGH (ref <135.00)

## 2015-02-15 ENCOUNTER — Encounter: Payer: Self-pay | Admitting: Internal Medicine

## 2015-02-15 ENCOUNTER — Other Ambulatory Visit: Payer: Self-pay | Admitting: Physician Assistant

## 2015-02-15 ENCOUNTER — Other Ambulatory Visit: Payer: Self-pay | Admitting: Internal Medicine

## 2015-02-15 MED ORDER — DICYCLOMINE HCL 20 MG PO TABS: 20.0000 mg | ORAL_TABLET | Freq: Three times a day (TID) | ORAL | Status: DC

## 2015-02-16 ENCOUNTER — Other Ambulatory Visit: Payer: Self-pay | Admitting: *Deleted

## 2015-02-16 MED ORDER — DICYCLOMINE HCL 10 MG PO CAPS: ORAL_CAPSULE | ORAL | Status: DC

## 2015-03-09 ENCOUNTER — Ambulatory Visit (INDEPENDENT_AMBULATORY_CARE_PROVIDER_SITE_OTHER): Payer: BLUE CROSS/BLUE SHIELD | Admitting: Internal Medicine

## 2015-03-09 ENCOUNTER — Encounter: Payer: Self-pay | Admitting: Internal Medicine

## 2015-03-09 VITALS — BP 148/88 | HR 86 | Temp 97.9°F | Resp 18 | Wt 169.0 lb

## 2015-03-09 DIAGNOSIS — H6981 Other specified disorders of Eustachian tube, right ear: Secondary | ICD-10-CM

## 2015-03-09 NOTE — Progress Notes (Signed)
   Subjective:    Patient ID: Jennifer Davenport, female    DOB: [birth year], 64 y.o.   MRN: [id number]  HPI Her symptoms began 03/05/15 as pain in the right retroauricular area as if she had a "blister". She also describes some pressure discomfort. She's had some associated tinnitus. She also describes sweats. She has had some left maxillary sinus congestion. Also she has postnasal drainage. At night she has a nonproductive cough. She has itchy has but no other extrinsic symptoms.  She has a complicated ENC history. She's had otic tubes in both ears at least 3 times as an adult. 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 She has had extensive surgeries for this.   Review of Systems Frontal headache, nasal purulence, dental pain, sore throat ,  or otic discharge denied. No fever or chills .  She denies excess sneezing or angioedema.  The cough is not associated with wheezing or shortness of breath.     Objective:   Physical Exam  General appearance:Adequately nourished; no acute distress or increased work of breathing is present.    Lymphatic: No  lymphadenopathy about the head, neck, or axilla .  Eyes: Bilateral ptosis present. No conjunctival inflammation or lid edema is present. There is no scleral icterus.  Ears:  External ear exam shows no significant lesions or deformities.  Otoscopic examination reveals clear canals, tympanic membranes are intact bilaterally without bulging, retraction, inflammation or discharge. Severe scarring of each tympanic membrane  Nose:  External nasal examination shows no deformity or inflammation. Nasal mucosa are markedly erythematous without lesions or exudates No septal dislocation or deviation.No obstruction to airflow.   Oral exam: Dental hygiene is good; lips and gums are healthy appearing.There is no oropharyngeal erythema or exudate . Slight dislocation of the left TMJ with mastication maneuver.  Neck:  No  deformities, thyromegaly, masses, or tenderness noted.   Supple with full range of motion without pain.   Heart:  Normal rate and regular rhythm. S1 and S2 normal without gallop, murmur, click, rub or other extra sounds.   Lungs:Chest clear to auscultation; no wheezes, rhonchi,rales ,or rubs present.  Extremities:  No cyanosis, edema, or clubbing  noted    Skin: Warm & dry w/o tenting or jaundice. No significant lesions or rash.        Assessment & Plan:  #1 eustachian tube dysfunction  Plan: as per protocol in AVS

## 2015-03-09 NOTE — Progress Notes (Signed)
Pre visit review using our clinic review tool, if applicable. No additional management support is needed unless otherwise documented below in the visit note. 

## 2015-03-09 NOTE — Patient Instructions (Signed)
Plain Mucinex (NOT D) for thick secretions ;force NON dairy fluids .   Nasal cleansing in the shower as discussed with lather of mild shampoo.After 10 seconds wash off lather while  exhaling through nostrils. Make sure that all residual soap is removed to prevent irritation.  Flonase OR Nasacort AQ 1 spray in each nostril twice a day as needed. Use the "crossover" technique into opposite nostril spraying toward opposite ear @ 45 degree angle, not straight up into nostril.  Plain Allegra (NOT D )  160 daily , Loratidine 10 mg , OR Zyrtec 10 mg @ bedtime  as needed for itchy eyes & sneezing.  Go to [facility] for eustachian tube dysfunction. Drink thin  fluids liberally through the day and chew sugarless gum . Do the Valsalva maneuver several times a day to "pop" ears open.

## 2015-03-31 ENCOUNTER — Encounter: Payer: Self-pay | Admitting: Gastroenterology

## 2015-05-04 ENCOUNTER — Ambulatory Visit: Payer: BC Managed Care – PPO | Admitting: Nurse Practitioner

## 2015-06-17 ENCOUNTER — Other Ambulatory Visit: Payer: Self-pay | Admitting: Nurse Practitioner

## 2015-06-18 NOTE — Telephone Encounter (Signed)
Medication refill request: Estradiol  Last AEX:  04/27/14 PG Next AEX: 06/29/15 PG Last MMG (if hormonal medication request): 10/26/14 [id number]:neg Refill authorized: 07/27/14 estradiol 0.01% in HRT replacement cream. Apply 68ml vaginally twice weekly. #30 grams/3RF. Today please advise. Rx listed under NONFORMULARY ITEM

## 2015-06-29 ENCOUNTER — Encounter: Payer: Self-pay | Admitting: Nurse Practitioner

## 2015-06-29 ENCOUNTER — Ambulatory Visit (INDEPENDENT_AMBULATORY_CARE_PROVIDER_SITE_OTHER): Payer: BLUE CROSS/BLUE SHIELD | Admitting: Nurse Practitioner

## 2015-06-29 VITALS — BP 140/84 | HR 64 | Resp 16 | Ht 60.75 in | Wt 168.0 lb

## 2015-06-29 DIAGNOSIS — N952 Postmenopausal atrophic vaginitis: Secondary | ICD-10-CM

## 2015-06-29 DIAGNOSIS — Z01419 Encounter for gynecological examination (general) (routine) without abnormal findings: Secondary | ICD-10-CM

## 2015-06-29 MED ORDER — NONFORMULARY OR COMPOUNDED ITEM: Status: DC

## 2015-06-29 NOTE — Patient Instructions (Signed)

## 2015-06-29 NOTE — Progress Notes (Signed)
Encounter reviewed Jill Jertson, MD   

## 2015-06-29 NOTE — Progress Notes (Addendum)
64 y.o. [id number] Married  Caucasian Fe here for annual exam.  Now on glutin and dairy free diet for 6 wk's. Doing well with vaginal estrogen cream.  Patient's last menstrual period was 11/24/1982.          Sexually active: Yes.    The current method of family planning is status post hysterectomy.    Exercising: Yes.    yoga, work with trainer 1 x weekly, stretch, and zumba Smoker:  no  Health Maintenance: Pap:  04/27/14 WNL MMG:  10/26/14 3D-BiRads 1-negative Colonoscopy:  6/15-repeat in 5 years BMD:   6/14- T score: spine 1.5; Left hip neck: 0.0 TDaP:  2014 Shingles: not done, titer is positive Labs: PCP   reports that she has never smoked. She has never used smokeless tobacco. She reports that she does not drink alcohol or use illicit drugs.  Past Medical History  Diagnosis Date  . Hyperlipidemia   . Hypertension   . Allergy   . Bulging disc 10/2012    CERVICAL  . GERD (gastroesophageal reflux disease)   . Atrophic vaginitis   . Endometriosis   . IBS (irritable bowel syndrome)   . Bursitis of hip     Past Surgical History  Procedure Laterality Date  . Appendectomy  1/72  . Tmj arthroplasty  6/94    secondary to hockey injury 15 years earlier  . Bunionectomy Left 04/2004    left foot  . Tubal ligation Bilateral 5/72  . Laparoscopic cholecystectomy  02/2005  . Hysterotomy  5/72    fetal demise, also BTL  . Abdominal hysterectomy  11/1982    AUB & endometriosis, ovaries remain    Current Outpatient Prescriptions  Medication Sig Dispense Refill  . calcium carbonate (OS-CAL) 600 MG TABS Take 600 mg by mouth 2 (two) times daily with a meal.    . cetirizine (ZYRTEC) 10 MG tablet Take 1 tablet (10 mg total) by mouth daily. 100 tablet 3  . Cholecalciferol (VITAMIN D-3) 1000 UNITS CAPS Take by mouth daily.    [name] dicyclomine (BENTYL) 10 MG capsule Take 1 tab  Every 6-8 hours as needed for cramping and spasms. 90 capsule 1  . esomeprazole (NEXIUM) 40 MG capsule Take 40 mg by mouth  daily at 12 noon.    . fluticasone (FLONASE) 50 MCG/ACT nasal spray Place into both nostrils daily.    . halobetasol (ULTRAVATE) 0.05 % ointment     . losartan-hydrochlorothiazide (HYZAAR) 100-12.5 MG per tablet Take 1 tablet by mouth daily. 90 tablet 3  . meclizine (ANTIVERT) 12.5 MG tablet Take 1 tablet (12.5 mg total) by mouth 3 (three) times daily as needed for dizziness. 60 tablet 1  . NONFORMULARY OR COMPOUNDED ITEM estradiol 0.01% in HRT replacement cream.  Apply 28ml vaginally twice weekly.  #30 grams 1 each 4  . Omega-3 Fatty Acids (FISH OIL) 1200 MG CAPS Take 1,400 mg by mouth daily.    [name] OVER THE COUNTER MEDICATION Take 800 mg by mouth 2 (two) times daily. Holy Basil    . simvastatin (ZOCOR) 40 MG tablet Take 1 tablet (40 mg total) by mouth every evening. 90 tablet 3   No current facility-administered medications for this visit.    Family History  Problem Relation Age of Onset  . Diverticulitis Mother   . Breast cancer Mother 55  . Hypertension Mother   . Heart disease Mother   . Osteoarthritis Mother   . Heart disease Brother   . Hypertension Brother   .  Lung cancer Brother 8    lung cancer, MI, ETOH  . Heart disease Sister   . Hypertension Sister   . Osteoarthritis Sister   . Hypertension Maternal Uncle   . Heart disease Maternal Uncle   . Hypertension Maternal Grandfather   . Heart disease Maternal Grandfather   . Hypertension Father     ROS:  Pertinent items are noted in HPI.  Otherwise, a comprehensive ROS was negative.  Exam:   BP 140/84 mmHg  Pulse 64  Resp 16  Ht 5' 0.75" (1.543 m)  Wt 168 lb (76.204 kg)  BMI 32.01 kg/m2  LMP 11/24/1982 Height: 5' 0.75" (154.3 cm) Ht Readings from Last 3 Encounters:  06/29/15 5' 0.75" (1.543 m)  02/11/15 5\' 1"  (1.549 m)  07/06/14 5\' 1"  (1.549 m)    General appearance: alert, cooperative and appears stated age Head: Normocephalic, without obvious abnormality, atraumatic Neck: no adenopathy, supple, symmetrical,  trachea midline and thyroid normal to inspection and palpation Lungs: clear to auscultation bilaterally Breasts: normal appearance, no masses or tenderness Heart: regular rate and rhythm Abdomen: soft, non-tender; no masses,  no organomegaly Extremities: extremities normal, atraumatic, no cyanosis or edema Skin: Skin color, texture, turgor normal. No rashes or lesions Lymph nodes: Cervical, supraclavicular, and axillary nodes normal. No abnormal inguinal nodes palpated Neurologic: Grossly normal   Pelvic: External genitalia:  no lesions              Urethra:  normal appearing urethra with no masses, tenderness or lesions              Bartholin's and Skene's: normal                 Vagina: normal appearing vagina with normal color and discharge, no lesions              Cervix: absent              Pap taken: No. Bimanual Exam:  Uterus:  uterus absent              Adnexa: no mass, fullness, tenderness               Rectovaginal: Confirms               Anus:  normal sphincter tone, no lesions  Chaperone present: no  A:  Well Woman with normal exam  S/P TAH secondary to endometriosis 1984 History of HTN, GERD, vertigo  Atrophic vaginitis better with estrogen vaginal cream    P:   Reviewed health and wellness pertinent to exam  Pap smear as above  Mammogram is due 10/2015  Refill on vaginal estrogen cream: estradiol 0.01% apply 1 ml twice a week. # 30 gm  Counseled with risk of CVA, DVT, cancer, etc.  Counseled on breast self exam, mammography screening, use and side effects of HRT, adequate intake of calcium and vitamin D, diet and exercise return annually or prn  An After Visit Summary was printed and given to the patient.

## 2015-08-17 ENCOUNTER — Encounter: Payer: Self-pay | Admitting: Internal Medicine

## 2015-08-17 ENCOUNTER — Ambulatory Visit (INDEPENDENT_AMBULATORY_CARE_PROVIDER_SITE_OTHER): Payer: BLUE CROSS/BLUE SHIELD | Admitting: Internal Medicine

## 2015-08-17 VITALS — BP 118/78 | HR 73 | Wt 166.0 lb

## 2015-08-17 DIAGNOSIS — I10 Essential (primary) hypertension: Secondary | ICD-10-CM

## 2015-08-17 DIAGNOSIS — K219 Gastro-esophageal reflux disease without esophagitis: Secondary | ICD-10-CM

## 2015-08-17 DIAGNOSIS — E119 Type 2 diabetes mellitus without complications: Secondary | ICD-10-CM

## 2015-08-17 DIAGNOSIS — Z0000 Encounter for general adult medical examination without abnormal findings: Secondary | ICD-10-CM

## 2015-08-17 DIAGNOSIS — R0989 Other specified symptoms and signs involving the circulatory and respiratory systems: Secondary | ICD-10-CM | POA: Insufficient documentation

## 2015-08-17 DIAGNOSIS — E785 Hyperlipidemia, unspecified: Secondary | ICD-10-CM

## 2015-08-17 NOTE — Assessment & Plan Note (Signed)
On Hyzaar 

## 2015-08-17 NOTE — Assessment & Plan Note (Signed)
On Simvastatin 

## 2015-08-17 NOTE — Assessment & Plan Note (Signed)
Jennifer Davenport

## 2015-08-17 NOTE — Progress Notes (Signed)
Pre visit review using our clinic review tool, if applicable. No additional management support is needed unless otherwise documented below in the visit note. 

## 2015-08-17 NOTE — Progress Notes (Signed)
Subjective:  Patient ID: Jennifer Davenport, female    DOB: 1951-04-27  Age: 64 y.o. MRN: [id number]  CC: No chief complaint on file.   HPI Jennifer Davenport presents for HTN, GERD, dyslipidemia f/u  Outpatient Prescriptions Prior to Visit  Medication Sig Dispense Refill  . cetirizine (ZYRTEC) 10 MG tablet Take 1 tablet (10 mg total) by mouth daily. 100 tablet 3  . Cholecalciferol (VITAMIN D-3) 1000 UNITS CAPS Take by mouth daily.    [name] dicyclomine (BENTYL) 10 MG capsule Take 1 tab  Every 6-8 hours as needed for cramping and spasms. 90 capsule 1  . esomeprazole (NEXIUM) 40 MG capsule Take 40 mg by mouth daily at 12 noon.    . fluticasone (FLONASE) 50 MCG/ACT nasal spray Place into both nostrils daily.    . halobetasol (ULTRAVATE) 0.05 % ointment     . losartan-hydrochlorothiazide (HYZAAR) 100-12.5 MG per tablet Take 1 tablet by mouth daily. 90 tablet 3  . meclizine (ANTIVERT) 12.5 MG tablet Take 1 tablet (12.5 mg total) by mouth 3 (three) times daily as needed for dizziness. 60 tablet 1  . NONFORMULARY OR COMPOUNDED ITEM estradiol 0.01% in HRT replacement cream.  Apply 48ml vaginally twice weekly.  #30 grams 1 each 4  . Omega-3 Fatty Acids (FISH OIL) 1200 MG CAPS Take 1,400 mg by mouth daily.    [name] OVER THE COUNTER MEDICATION Take 800 mg by mouth 2 (two) times daily. Holy Basil    . simvastatin (ZOCOR) 40 MG tablet Take 1 tablet (40 mg total) by mouth every evening. 90 tablet 3  . calcium carbonate (OS-CAL) 600 MG TABS Take 600 mg by mouth 2 (two) times daily with a meal.     No facility-administered medications prior to visit.    ROS Review of Systems  Constitutional: Negative for chills, activity change, appetite change, fatigue and unexpected weight change.  HENT: Negative for congestion, mouth sores and sinus pressure.   Eyes: Negative for visual disturbance.  Respiratory: Negative for cough and chest tightness.   Gastrointestinal: Negative for nausea and abdominal pain.    Genitourinary: Negative for frequency, difficulty urinating and vaginal pain.  Musculoskeletal: Negative for back pain and gait problem.  Skin: Negative for pallor and rash.  Neurological: Negative for dizziness, tremors, weakness, numbness and headaches.  Psychiatric/Behavioral: Negative for suicidal ideas, confusion and sleep disturbance.    Objective:  BP 118/78 mmHg  Pulse 73  Wt 166 lb (75.297 kg)  SpO2 99%  LMP 11/24/1982  BP Readings from Last 3 Encounters:  08/17/15 118/78  06/29/15 140/84  03/09/15 148/88    Wt Readings from Last 3 Encounters:  08/17/15 166 lb (75.297 kg)  06/29/15 168 lb (76.204 kg)  03/09/15 169 lb (76.658 kg)    Physical Exam  Constitutional: She appears well-developed. No distress.  HENT:  Head: Normocephalic.  Right Ear: External ear normal.  Left Ear: External ear normal.  Nose: Nose normal.  Mouth/Throat: Oropharynx is clear and moist.  Eyes: Conjunctivae are normal. Pupils are equal, round, and reactive to light. Right eye exhibits no discharge. Left eye exhibits no discharge.  Neck: Normal range of motion. Neck supple. No JVD present. No tracheal deviation present. No thyromegaly present.  Cardiovascular: Normal rate, regular rhythm and normal heart sounds.   Pulmonary/Chest: No stridor. No respiratory distress. She has no wheezes.  Abdominal: Soft. Bowel sounds are normal. She exhibits no distension and no mass. There is no tenderness. There is no rebound and no guarding.  Musculoskeletal: She exhibits no edema or tenderness.  Lymphadenopathy:    She has no cervical adenopathy.  Neurological: She displays normal reflexes. No cranial nerve deficit. She exhibits normal muscle tone. Coordination normal.  Skin: No rash noted. No erythema.  Psychiatric: She has a normal mood and affect. Her behavior is normal. Judgment and thought content normal.  mild bruit  Lab Results  Component Value Date   WBC 6.4 02/11/2015   HGB 13.0 02/11/2015    HCT 38.4 02/11/2015   PLT 270.0 02/11/2015   GLUCOSE 90 02/11/2015   CHOL 179 02/11/2015   TRIG 136.0 02/11/2015   HDL 60.50 02/11/2015   LDLCALC 91 02/11/2015   ALT 23 02/11/2015   AST 21 02/11/2015   NA 137 02/11/2015   K 3.8 02/11/2015   CL 102 02/11/2015   CREATININE 0.67 02/11/2015   BUN 12 02/11/2015   CO2 28 02/11/2015   TSH 1.74 02/11/2015    Mm Screening Breast Tomo Bilateral  10/26/2014  CLINICAL DATA:  Screening. EXAM: DIGITAL SCREENING BILATERAL MAMMOGRAM WITH 3D TOMO WITH CAD COMPARISON:  Previous exam(s). ACR Breast Density Category b: There are scattered areas of fibroglandular density. FINDINGS: There are no findings suspicious for malignancy. Images were processed with CAD. IMPRESSION: No mammographic evidence of malignancy. A result letter of this screening mammogram will be mailed directly to the patient. RECOMMENDATION: Screening mammogram in one year. (Code:SM-B-01Y) BI-RADS CATEGORY  1: Negative. Electronically Signed   By: Lillia Mountain M.D.   On: 10/26/2014 13:02    Assessment & Plan:   Diagnoses and all orders for this visit:  Essential hypertension  Gastroesophageal reflux disease without esophagitis  Dyslipidemia  I am having Ms. Olazabal maintain her calcium carbonate, Vitamin D-3, Fish Oil, halobetasol, cetirizine, esomeprazole, fluticasone, meclizine, OVER THE COUNTER MEDICATION, simvastatin, losartan-hydrochlorothiazide, dicyclomine, and NONFORMULARY OR COMPOUNDED ITEM.  No orders of the defined types were placed in this encounter.     Follow-up: Return in about 6 months (around 02/14/2016) for Wellness Exam.  Walker Kehr, MD

## 2015-08-17 NOTE — Assessment & Plan Note (Signed)
Nexium 

## 2015-08-22 ENCOUNTER — Encounter: Payer: Self-pay | Admitting: Internal Medicine

## 2015-08-22 DIAGNOSIS — E119 Type 2 diabetes mellitus without complications: Secondary | ICD-10-CM | POA: Insufficient documentation

## 2015-08-22 NOTE — Assessment & Plan Note (Signed)
We discussed age appropriate health related issues, including available/recomended screening tests and vaccinations. We discussed a need for adhering to healthy diet and exercise. Labs/EKG were reviewed/ordered. All questions were answered.   

## 2015-08-22 NOTE — Assessment & Plan Note (Signed)
A1c

## 2015-09-06 ENCOUNTER — Encounter: Payer: Self-pay | Admitting: Internal Medicine

## 2015-09-06 DIAGNOSIS — R0989 Other specified symptoms and signs involving the circulatory and respiratory systems: Secondary | ICD-10-CM

## 2015-09-06 NOTE — Telephone Encounter (Signed)
[place], can you help with the right order number for carotid bilateral US  [place], can you verify the type of procedure you would like to have done. Pended for your review per the LOV (08/17/15) dx carotid bruit.

## 2015-09-10 ENCOUNTER — Other Ambulatory Visit: Payer: Self-pay | Admitting: *Deleted

## 2015-09-10 DIAGNOSIS — R0989 Other specified symptoms and signs involving the circulatory and respiratory systems: Secondary | ICD-10-CM

## 2015-09-16 ENCOUNTER — Encounter (HOSPITAL_COMMUNITY): Payer: BLUE CROSS/BLUE SHIELD

## 2015-09-28 ENCOUNTER — Other Ambulatory Visit: Payer: Self-pay

## 2015-09-28 DIAGNOSIS — Z1231 Encounter for screening mammogram for malignant neoplasm of breast: Secondary | ICD-10-CM

## 2015-09-30 ENCOUNTER — Ambulatory Visit (HOSPITAL_COMMUNITY)
Admission: RE | Admit: 2015-09-30 | Discharge: 2015-09-30 | Disposition: A | Discharge status: Home / Self Care | Payer: BLUE CROSS/BLUE SHIELD | Source: Ambulatory Visit | Attending: Cardiology | Admitting: Cardiology

## 2015-09-30 DIAGNOSIS — E785 Hyperlipidemia, unspecified: Secondary | ICD-10-CM | POA: Insufficient documentation

## 2015-09-30 DIAGNOSIS — I10 Essential (primary) hypertension: Secondary | ICD-10-CM | POA: Diagnosis not present

## 2015-09-30 DIAGNOSIS — I6523 Occlusion and stenosis of bilateral carotid arteries: Secondary | ICD-10-CM | POA: Diagnosis not present

## 2015-09-30 DIAGNOSIS — R0989 Other specified symptoms and signs involving the circulatory and respiratory systems: Secondary | ICD-10-CM

## 2015-10-27 LAB — HM MAMMOGRAPHY: HM Mammogram: NORMAL (ref 0–4)

## 2015-10-28 ENCOUNTER — Ambulatory Visit
Admission: RE | Admit: 2015-10-28 | Discharge: 2015-10-28 | Disposition: A | Discharge status: Home / Self Care | Payer: BLUE CROSS/BLUE SHIELD | Source: Ambulatory Visit

## 2015-10-28 DIAGNOSIS — Z1231 Encounter for screening mammogram for malignant neoplasm of breast: Secondary | ICD-10-CM

## 2016-02-15 ENCOUNTER — Other Ambulatory Visit (INDEPENDENT_AMBULATORY_CARE_PROVIDER_SITE_OTHER): Payer: BLUE CROSS/BLUE SHIELD

## 2016-02-15 ENCOUNTER — Encounter: Payer: Self-pay | Admitting: Internal Medicine

## 2016-02-15 ENCOUNTER — Ambulatory Visit (INDEPENDENT_AMBULATORY_CARE_PROVIDER_SITE_OTHER): Payer: BLUE CROSS/BLUE SHIELD | Admitting: Internal Medicine

## 2016-02-15 VITALS — BP 136/80 | HR 73 | Ht 61.0 in | Wt 166.0 lb

## 2016-02-15 DIAGNOSIS — Z0000 Encounter for general adult medical examination without abnormal findings: Secondary | ICD-10-CM

## 2016-02-15 DIAGNOSIS — I10 Essential (primary) hypertension: Secondary | ICD-10-CM | POA: Diagnosis not present

## 2016-02-15 DIAGNOSIS — E119 Type 2 diabetes mellitus without complications: Secondary | ICD-10-CM | POA: Diagnosis not present

## 2016-02-15 LAB — CBC WITH DIFFERENTIAL/PLATELET
Basophils Absolute: 0 10*3/uL (ref 0.0–0.1)
Basophils Relative: 0.6 % (ref 0.0–3.0)
Eosinophils Absolute: 0.2 10*3/uL (ref 0.0–0.7)
Eosinophils Relative: 2.7 % (ref 0.0–5.0)
HCT: 39.8 % (ref 36.0–46.0)
Hemoglobin: 13.1 g/dL (ref 12.0–15.0)
Lymphocytes Relative: 36.7 % (ref 12.0–46.0)
Lymphs Abs: 2.1 10*3/uL (ref 0.7–4.0)
MCHC: 33.1 g/dL (ref 30.0–36.0)
MCV: 86.9 fl (ref 78.0–100.0)
Monocytes Absolute: 0.4 10*3/uL (ref 0.1–1.0)
Monocytes Relative: 7.6 % (ref 3.0–12.0)
Neutro Abs: 3 10*3/uL (ref 1.4–7.7)
Neutrophils Relative %: 52.4 % (ref 43.0–77.0)
Platelets: 249 10*3/uL (ref 150.0–400.0)
RBC: 4.57 Mil/uL (ref 3.87–5.11)
RDW: 13 % (ref 11.5–15.5)
WBC: 5.6 10*3/uL (ref 4.0–10.5)

## 2016-02-15 LAB — BASIC METABOLIC PANEL
BUN: 8 mg/dL (ref 6–23)
CO2: 27 mEq/L (ref 19–32)
Calcium: 9.9 mg/dL (ref 8.4–10.5)
Chloride: 101 mEq/L (ref 96–112)
Creatinine, Ser: 0.66 mg/dL (ref 0.40–1.20)
GFR: 95.93 mL/min (ref >60.00)
Glucose, Bld: 92 mg/dL (ref 70–99)
Potassium: 4 mEq/L (ref 3.5–5.1)
Sodium: 136 mEq/L (ref 135–145)

## 2016-02-15 LAB — URINALYSIS
Bilirubin Urine: NEGATIVE
Hgb urine dipstick: NEGATIVE
Ketones, ur: NEGATIVE
Leukocytes, UA: NEGATIVE
Nitrite: NEGATIVE
Specific Gravity, Urine: 1.01 (ref 1.000–1.030)
Total Protein, Urine: NEGATIVE
Urine Glucose: NEGATIVE
Urobilinogen, UA: 0.2 (ref 0.0–1.0)
pH: 8 (ref 5.0–8.0)

## 2016-02-15 LAB — LIPID PANEL
Cholesterol: 162 mg/dL (ref 0–200)
HDL: 58.5 mg/dL (ref >39.00)
LDL Cholesterol: 73 mg/dL (ref 0–99)
NonHDL: 103.98
Total CHOL/HDL Ratio: 3
Triglycerides: 153 mg/dL — ABNORMAL HIGH (ref 0.0–149.0)
VLDL: 30.6 mg/dL (ref 0.0–40.0)

## 2016-02-15 LAB — HEPATIC FUNCTION PANEL
ALT: 22 U/L (ref 0–35)
AST: 19 U/L (ref 0–37)
Albumin: 4.6 g/dL (ref 3.5–5.2)
Alkaline Phosphatase: 48 U/L (ref 39–117)
Bilirubin, Direct: 0.1 mg/dL (ref 0.0–0.3)
Total Bilirubin: 0.7 mg/dL (ref 0.2–1.2)
Total Protein: 7.3 g/dL (ref 6.0–8.3)

## 2016-02-15 LAB — HEMOGLOBIN A1C: Hgb A1c MFr Bld: 5.8 % (ref 4.6–6.5)

## 2016-02-15 LAB — TSH: TSH: 2.23 u[IU]/mL (ref 0.35–4.50)

## 2016-02-15 MED ORDER — SIMVASTATIN 40 MG PO TABS: 40.0000 mg | ORAL_TABLET | Freq: Every evening | ORAL | Status: DC

## 2016-02-15 MED ORDER — DICYCLOMINE HCL 10 MG PO CAPS: ORAL_CAPSULE | ORAL | Status: DC

## 2016-02-15 MED ORDER — LOSARTAN POTASSIUM-HCTZ 100-12.5 MG PO TABS: 1.0000 | ORAL_TABLET | Freq: Every day | ORAL | Status: DC

## 2016-02-15 NOTE — Assessment & Plan Note (Signed)
We discussed age appropriate health related issues, including available/recomended screening tests and vaccinations. We discussed a need for adhering to healthy diet and exercise. Labs/EKG were reviewed/ordered. All questions were answered.   

## 2016-02-15 NOTE — Progress Notes (Signed)
Subjective:  Patient ID: Colvin Caroli, female    DOB: [birth year]  Age: 65 y.o. MRN: [id number]  CC: Annual Exam   HPI SHAHADA KROTZ presents for a well exam. The pt is exercising  Outpatient Prescriptions Prior to Visit  Medication Sig Dispense Refill  . cetirizine (ZYRTEC) 10 MG tablet Take 1 tablet (10 mg total) by mouth daily. 100 tablet 3  . Cholecalciferol (VITAMIN D-3) 1000 UNITS CAPS Take by mouth daily.    [name] esomeprazole (NEXIUM) 40 MG capsule Take 40 mg by mouth daily at 12 noon.    . fluticasone (FLONASE) 50 MCG/ACT nasal spray Place into both nostrils daily.    [name] losartan-hydrochlorothiazide (HYZAAR) 100-12.5 MG per tablet Take 1 tablet by mouth daily. 90 tablet 3  . meclizine (ANTIVERT) 12.5 MG tablet Take 1 tablet (12.5 mg total) by mouth 3 (three) times daily as needed for dizziness. 60 tablet 1  . NONFORMULARY OR COMPOUNDED ITEM estradiol 0.01% in HRT replacement cream.  Apply 72ml vaginally twice weekly.  #30 grams 1 each 4  . Omega-3 Fatty Acids (FISH OIL) 1200 MG CAPS Take 1,400 mg by mouth daily.    [name] OVER THE COUNTER MEDICATION Take 800 mg by mouth 2 (two) times daily. Holy Basil    . simvastatin (ZOCOR) 40 MG tablet Take 1 tablet (40 mg total) by mouth every evening. 90 tablet 3  . dicyclomine (BENTYL) 10 MG capsule Take 1 tab  Every 6-8 hours as needed for cramping and spasms. 90 capsule 1  . halobetasol (ULTRAVATE) 0.05 % ointment Reported on 02/15/2016     No facility-administered medications prior to visit.    ROS Review of Systems  Constitutional: Negative for chills, activity change, appetite change, fatigue and unexpected weight change.  HENT: Negative for congestion, mouth sores and sinus pressure.   Eyes: Negative for visual disturbance.  Respiratory: Negative for cough and chest tightness.   Gastrointestinal: Negative for nausea and abdominal pain.  Genitourinary: Negative for frequency, difficulty urinating and vaginal pain.  Musculoskeletal:  Negative for back pain and gait problem.  Skin: Negative for pallor and rash.  Neurological: Negative for dizziness, tremors, weakness, numbness and headaches.  Psychiatric/Behavioral: Negative for confusion, sleep disturbance and dysphoric mood. The patient is not nervous/anxious.     Objective:  BP 136/80 mmHg  Pulse 73  Ht 5\' 1"  (1.549 m)  Wt 166 lb (75.297 kg)  BMI 31.38 kg/m2  SpO2 97%  LMP 11/24/1982  BP Readings from Last 3 Encounters:  02/15/16 136/80  08/17/15 118/78  06/29/15 140/84    Wt Readings from Last 3 Encounters:  02/15/16 166 lb (75.297 kg)  08/17/15 166 lb (75.297 kg)  06/29/15 168 lb (76.204 kg)    Physical Exam  Constitutional: She appears well-developed. No distress.  HENT:  Head: Normocephalic.  Right Ear: External ear normal.  Left Ear: External ear normal.  Nose: Nose normal.  Mouth/Throat: Oropharynx is clear and moist.  Eyes: Conjunctivae are normal. Pupils are equal, round, and reactive to light. Right eye exhibits no discharge. Left eye exhibits no discharge.  Neck: Normal range of motion. Neck supple. No JVD present. No tracheal deviation present. No thyromegaly present.  Cardiovascular: Normal rate, regular rhythm and normal heart sounds.   Pulmonary/Chest: No stridor. No respiratory distress. She has no wheezes.  Abdominal: Soft. Bowel sounds are normal. She exhibits no distension and no mass. There is no tenderness. There is no rebound and no guarding.  Musculoskeletal: She exhibits no  edema or tenderness.  Lymphadenopathy:    She has no cervical adenopathy.  Neurological: She displays normal reflexes. No cranial nerve deficit. She exhibits normal muscle tone. Coordination normal.  Skin: No rash noted. No erythema.  Psychiatric: She has a normal mood and affect. Her behavior is normal. Judgment and thought content normal.  Obese  Lab Results  Component Value Date   WBC 6.4 02/11/2015   HGB 13.0 02/11/2015   HCT 38.4 02/11/2015    PLT 270.0 02/11/2015   GLUCOSE 90 02/11/2015   CHOL 179 02/11/2015   TRIG 136.0 02/11/2015   HDL 60.50 02/11/2015   LDLCALC 91 02/11/2015   ALT 23 02/11/2015   AST 21 02/11/2015   NA 137 02/11/2015   K 3.8 02/11/2015   CL 102 02/11/2015   CREATININE 0.67 02/11/2015   BUN 12 02/11/2015   CO2 28 02/11/2015   TSH 1.74 02/11/2015    Mm Screening Breast Tomo Bilateral  10/28/2015  CLINICAL DATA:  Screening. EXAM: DIGITAL SCREENING BILATERAL MAMMOGRAM WITH 3D TOMO WITH CAD COMPARISON:  Previous exam(s). ACR Breast Density Category b: There are scattered areas of fibroglandular density. FINDINGS: There are no findings suspicious for malignancy. Images were processed with CAD. IMPRESSION: No mammographic evidence of malignancy. A result letter of this screening mammogram will be mailed directly to the patient. RECOMMENDATION: Screening mammogram in one year. (Code:SM-B-01Y) BI-RADS CATEGORY  1: Negative. Electronically Signed   By: Dorise Bullion III M.D   On: 10/28/2015 18:14    Assessment & Plan:   Basya was seen today for annual exam.  Diagnoses and all orders for this visit:  Well adult exam  Essential hypertension  Type 2 diabetes mellitus without complication, without long-term current use of insulin ([facility])  Other orders -     losartan-hydrochlorothiazide (HYZAAR) 100-12.5 MG tablet; Take 1 tablet by mouth daily. -     simvastatin (ZOCOR) 40 MG tablet; Take 1 tablet (40 mg total) by mouth every evening. -     dicyclomine (BENTYL) 10 MG capsule; Take 1 tab  Every 6-8 hours as needed for cramping and spasms.   I am having Ms. Stuller maintain her Vitamin D-3, Fish Oil, halobetasol, cetirizine, esomeprazole, fluticasone, meclizine, OVER THE COUNTER MEDICATION, simvastatin, losartan-hydrochlorothiazide, dicyclomine, NONFORMULARY OR COMPOUNDED ITEM, b complex vitamins, and PROBIOTIC PEARLS.  Meds ordered this encounter  Medications  . b complex vitamins tablet    Sig: Take 1  tablet by mouth daily.  . Probiotic Product (PROBIOTIC PEARLS) CAPS    Sig: Take 1 capsule by mouth daily.     Follow-up: No Follow-up on file.  Walker Kehr, MD

## 2016-02-15 NOTE — Assessment & Plan Note (Signed)
  On diet  

## 2016-02-15 NOTE — Progress Notes (Signed)
Pre visit review using our clinic review tool, if applicable. No additional management support is needed unless otherwise documented below in the visit note. 

## 2016-02-15 NOTE — Assessment & Plan Note (Signed)
Chronic  On Hyzaar 

## 2016-02-16 LAB — HEPATITIS C ANTIBODY: HCV Ab: NEGATIVE

## 2016-06-30 ENCOUNTER — Ambulatory Visit (INDEPENDENT_AMBULATORY_CARE_PROVIDER_SITE_OTHER): Payer: BLUE CROSS/BLUE SHIELD | Admitting: Nurse Practitioner

## 2016-06-30 ENCOUNTER — Encounter: Payer: Self-pay | Admitting: Nurse Practitioner

## 2016-06-30 VITALS — BP 132/84 | HR 64 | Ht 60.75 in | Wt 166.0 lb

## 2016-06-30 DIAGNOSIS — Z01419 Encounter for gynecological examination (general) (routine) without abnormal findings: Secondary | ICD-10-CM | POA: Diagnosis not present

## 2016-06-30 DIAGNOSIS — N952 Postmenopausal atrophic vaginitis: Secondary | ICD-10-CM | POA: Diagnosis not present

## 2016-06-30 DIAGNOSIS — Z0000 Encounter for general adult medical examination without abnormal findings: Secondary | ICD-10-CM

## 2016-06-30 MED ORDER — NONFORMULARY OR COMPOUNDED ITEM: 4 refills | Status: DC

## 2016-06-30 NOTE — Patient Instructions (Signed)

## 2016-06-30 NOTE — Progress Notes (Signed)
Patient ID: Jennifer Davenport, female   DOB: 1951-07-07, 65 y.o.   MRN: [id number]  65 y.o. [id number] Married  Caucasian Fe here for annual exam.  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  Has had cortisone injection, homeopathic treatment, ice, heat, rest.  All of this minimal help and going for SI joint nerve block next week.  MRI done 2 weeks ago   Patient's last menstrual period was 11/24/1982.          Sexually active: Yes.    The current method of family planning is status post hysterectomy.    Exercising: Yes.    yoga stretch 3 times per week, strength and cardio 1 times per week (with trainer) Smoker:  no  Health Maintenance: Pap:  04/27/14, Negative  MMG: 10/28/15, 3D, Bi-Rads 1: Negative Colonoscopy:  04/01/2013- tubular adenoma & hyperplastic polyp  repeat in 5 years BMD: 03/11/13 T Score; 1.5 Spine / 0.0 Left Femur Neck TDaP:  2014 Shingles: not done, titer is positive 02/11/15 Hep C: 02/15/16 Labs: PCP takes care of all labs (in [facility])   reports that she has never smoked. She has never used smokeless tobacco. She reports that she does not drink alcohol or use drugs.  Past Medical History:  Diagnosis Date  . Allergy   . Atrophic vaginitis   . Bulging disc 10/2012   CERVICAL  . Bursitis of hip   . Endometriosis   . GERD (gastroesophageal reflux disease)   . Hyperlipidemia   . Hypertension   . IBS (irritable bowel syndrome)     Past Surgical History:  Procedure Laterality Date  . ABDOMINAL HYSTERECTOMY  11/1982   AUB & endometriosis, ovaries remain  . APPENDECTOMY  1/72  . BUNIONECTOMY Left 04/2004   left foot  . HYSTEROTOMY  5/72   fetal demise, also BTL  . LAPAROSCOPIC CHOLECYSTECTOMY  02/2005  . TMJ ARTHROPLASTY  6/94   secondary to hockey injury 15 years earlier  . TUBAL LIGATION Bilateral 5/72    Current Outpatient Prescriptions  Medication Sig Dispense Refill  . b complex vitamins tablet Take 1 tablet by mouth daily.    . cetirizine (ZYRTEC) 10 MG tablet Take 1  tablet (10 mg total) by mouth daily. 100 tablet 3  . Cholecalciferol (VITAMIN D-3) 1000 UNITS CAPS Take by mouth daily.    [name] dicyclomine (BENTYL) 10 MG capsule Take 1 tab  Every 6-8 hours as needed for cramping and spasms. 90 capsule 3  . esomeprazole (NEXIUM) 40 MG capsule Take 40 mg by mouth daily at 12 noon.    . fluticasone (FLONASE) 50 MCG/ACT nasal spray Place into both nostrils daily.    [name] losartan-hydrochlorothiazide (HYZAAR) 100-12.5 MG tablet Take 1 tablet by mouth daily. 90 tablet 3  . meclizine (ANTIVERT) 12.5 MG tablet Take 1 tablet (12.5 mg total) by mouth 3 (three) times daily as needed for dizziness. 60 tablet 1  . NONFORMULARY OR COMPOUNDED ITEM estradiol 0.01% in HRT replacement cream.  Apply 45ml vaginally twice weekly.  #30 grams 1 each 4  . Omega-3 Fatty Acids (FISH OIL) 1200 MG CAPS Take 1,400 mg by mouth daily.    [name] OVER THE COUNTER MEDICATION Take 800 mg by mouth 2 (two) times daily. [facility]    . Probiotic Product (PROBIOTIC PEARLS) CAPS Take 1 capsule by mouth daily.     No current facility-administered medications for this visit.     Family History  Problem Relation Age of Onset  .  Diverticulitis Mother   . Breast cancer Mother 74  . Hypertension Mother   . Heart disease Mother   . Osteoarthritis Mother   . Heart disease Brother   . Hypertension Brother   . Lung cancer Brother 69    lung cancer, MI, ETOH  . Heart disease Sister   . Hypertension Sister   . Osteoarthritis Sister   . Hypertension Father   . Hypertension Maternal Uncle   . Heart disease Maternal Uncle   . Hypertension Maternal Grandfather   . Heart disease Maternal Grandfather     ROS:  Pertinent items are noted in HPI.  Otherwise, a comprehensive ROS was negative.  Exam:   BP 132/84 (BP Location: Right Arm, Patient Position: Sitting, Cuff Size: Normal)   Pulse 64   Ht 5' 0.75" (1.543 m)   Wt 166 lb (75.3 kg)   LMP 11/24/1982   BMI 31.62 kg/m  Height: 5' 0.75" (154.3 cm) Ht  Readings from Last 3 Encounters:  06/30/16 5' 0.75" (1.543 m)  02/15/16 5\' 1"  (1.549 m)  06/29/15 5' 0.75" (1.543 m)    General appearance: alert, cooperative and appears stated age Head: Normocephalic, without obvious abnormality, atraumatic Neck: no adenopathy, supple, symmetrical, trachea midline and thyroid normal to inspection and palpation Lungs: clear to auscultation bilaterally Breasts: normal appearance, no masses or tenderness Heart: regular rate and rhythm Abdomen: soft, non-tender; no masses,  no organomegaly Extremities: extremities normal, atraumatic, no cyanosis or edema Skin: Skin color, texture, turgor normal. No rashes or lesions Lymph nodes: Cervical, supraclavicular, and axillary nodes normal. No abnormal inguinal nodes palpated Neurologic: Grossly normal   Pelvic: External genitalia:  no lesions              Urethra:  normal appearing urethra with no masses, tenderness or lesions              Bartholin's and Skene's: normal                 Vagina: normal appearing vagina with normal color and discharge, no lesions              Cervix: absent              Pap taken: Yes.  per request Bimanual Exam:  Uterus:  uterus absent              Adnexa: no mass, fullness, tenderness               Rectovaginal: Confirms               Anus:  normal sphincter tone, no lesions  Chaperone present: yes  A:  Well Woman with normal exam   S/P TAH secondary to endometriosis 1984 History of HTN, GERD, vertigo             Atrophic vaginitis better with estrogen vaginal cream  Pain right SI joint space - followed by Ortho   P:   Reviewed health and wellness pertinent to exam  Pap smear as above  Mammogram is due 10/2016  Refill on Vaginal estrogen for a year  Counseled with risk of DVT, CVA, cancer, etc.  Counseled on breast self exam, mammography screening, use and side effects of HRT, adequate intake of calcium and vitamin D, diet and exercise, Kegel's  exercises return annually or prn  An After Visit Summary was printed and given to the patient.

## 2016-07-04 ENCOUNTER — Ambulatory Visit: Payer: BLUE CROSS/BLUE SHIELD | Admitting: Nurse Practitioner

## 2016-07-04 ENCOUNTER — Other Ambulatory Visit: Payer: Self-pay | Admitting: Nurse Practitioner

## 2016-07-04 LAB — IPS PAP TEST WITH HPV

## 2016-07-04 MED ORDER — FLUCONAZOLE 150 MG PO TABS: 150.0000 mg | ORAL_TABLET | Freq: Once | ORAL | 0 refills | Status: AC

## 2016-07-04 NOTE — Progress Notes (Signed)
Encounter reviewed by Dr. Brook Amundson C. Silva.  

## 2016-07-26 ENCOUNTER — Ambulatory Visit (INDEPENDENT_AMBULATORY_CARE_PROVIDER_SITE_OTHER): Payer: BLUE CROSS/BLUE SHIELD

## 2016-07-26 DIAGNOSIS — Z23 Encounter for immunization: Secondary | ICD-10-CM | POA: Diagnosis not present

## 2016-08-22 ENCOUNTER — Other Ambulatory Visit (INDEPENDENT_AMBULATORY_CARE_PROVIDER_SITE_OTHER): Payer: BLUE CROSS/BLUE SHIELD

## 2016-08-22 ENCOUNTER — Encounter: Payer: Self-pay | Admitting: Internal Medicine

## 2016-08-22 ENCOUNTER — Ambulatory Visit (INDEPENDENT_AMBULATORY_CARE_PROVIDER_SITE_OTHER): Payer: BLUE CROSS/BLUE SHIELD | Admitting: Internal Medicine

## 2016-08-22 DIAGNOSIS — G8929 Other chronic pain: Secondary | ICD-10-CM

## 2016-08-22 DIAGNOSIS — E119 Type 2 diabetes mellitus without complications: Secondary | ICD-10-CM

## 2016-08-22 DIAGNOSIS — M545 Low back pain, unspecified: Secondary | ICD-10-CM

## 2016-08-22 DIAGNOSIS — L570 Actinic keratosis: Secondary | ICD-10-CM | POA: Diagnosis not present

## 2016-08-22 DIAGNOSIS — I10 Essential (primary) hypertension: Secondary | ICD-10-CM

## 2016-08-22 LAB — BASIC METABOLIC PANEL
BUN: 7 mg/dL (ref 6–23)
CO2: 29 mEq/L (ref 19–32)
Calcium: 9.5 mg/dL (ref 8.4–10.5)
Chloride: 98 mEq/L (ref 96–112)
Creatinine, Ser: 0.64 mg/dL (ref 0.40–1.20)
GFR: 99.23 mL/min (ref >60.00)
Glucose, Bld: 84 mg/dL (ref 70–99)
Potassium: 4 mEq/L (ref 3.5–5.1)
Sodium: 134 mEq/L — ABNORMAL LOW (ref 135–145)

## 2016-08-22 LAB — HEMOGLOBIN A1C: Hgb A1c MFr Bld: 5.5 % (ref 4.6–6.5)

## 2016-08-22 NOTE — Assessment & Plan Note (Signed)
Cryo Derm ref if not better

## 2016-08-22 NOTE — Assessment & Plan Note (Signed)
Had PT

## 2016-08-22 NOTE — Assessment & Plan Note (Signed)
  On diet  

## 2016-08-22 NOTE — Assessment & Plan Note (Signed)
Hyzaar Labs 

## 2016-08-22 NOTE — Progress Notes (Signed)
Pre visit review using our clinic review tool, if applicable. No additional management support is needed unless otherwise documented below in the visit note/SLS  

## 2016-08-22 NOTE — Progress Notes (Signed)
Subjective:  Patient ID: Jennifer Davenport, female    DOB: [birth year]  Age: 65 y.o. MRN: [id number]  CC: Follow-up (6-Mths: HTN; DM II); Medication Problem (Patient reports D/C due to leg pain x2 mths ago); and Skin Problem (Pt c/o flat lesion on end of nose, not improving)   HPI KATHRYN SCHALLERT presents for dyslipidemia. Stain caused leg pain  Outpatient Medications Prior to Visit  Medication Sig Dispense Refill  . b complex vitamins tablet Take 1 tablet by mouth daily.    . cetirizine (ZYRTEC) 10 MG tablet Take 1 tablet (10 mg total) by mouth daily. 100 tablet 3  . Cholecalciferol (VITAMIN D-3) 1000 UNITS CAPS Take by mouth daily.    [name] dicyclomine (BENTYL) 10 MG capsule Take 1 tab  Every 6-8 hours as needed for cramping and spasms. 90 capsule 3  . esomeprazole (NEXIUM) 40 MG capsule Take 40 mg by mouth daily at 12 noon.    . fluticasone (FLONASE) 50 MCG/ACT nasal spray Place 1-2 sprays into both nostrils daily.     [name] losartan-hydrochlorothiazide (HYZAAR) 100-12.5 MG tablet Take 1 tablet by mouth daily. 90 tablet 3  . meclizine (ANTIVERT) 12.5 MG tablet Take 1 tablet (12.5 mg total) by mouth 3 (three) times daily as needed for dizziness. 60 tablet 1  . NONFORMULARY OR COMPOUNDED ITEM estradiol 0.01% in HRT replacement cream.  Apply 21ml vaginally twice weekly.  #30 grams 1 each 4  . Omega-3 Fatty Acids (FISH OIL) 1200 MG CAPS Take 1,400 mg by mouth daily.    [name] OVER THE COUNTER MEDICATION Take 800 mg by mouth 2 (two) times daily. [facility]    . Probiotic Product (PROBIOTIC PEARLS) CAPS Take 1 capsule by mouth daily.     No facility-administered medications prior to visit.     ROS Review of Systems  Constitutional: Negative for activity change, appetite change, chills, fatigue and unexpected weight change.  HENT: Negative for congestion, mouth sores and sinus pressure.   Eyes: Negative for visual disturbance.  Respiratory: Negative for cough and chest tightness.   Gastrointestinal:  Negative for abdominal pain and nausea.  Genitourinary: Negative for difficulty urinating, frequency and vaginal pain.  Musculoskeletal: Positive for arthralgias. Negative for back pain and gait problem.  Skin: Negative for pallor and rash.  Neurological: Negative for dizziness, tremors, weakness, numbness and headaches.  Psychiatric/Behavioral: Negative for confusion and sleep disturbance. The patient is not nervous/anxious.     Objective:  BP 128/80 (BP Location: Right Arm, Patient Position: Sitting, Cuff Size: Large)   Pulse 74   Temp 97.8 F (36.6 C) (Oral)   Resp 16   Ht 5' 0.75" (1.543 m)   Wt 169 lb (76.7 kg)   LMP 11/24/1982   SpO2 98%   BMI 32.20 kg/m   BP Readings from Last 3 Encounters:  08/22/16 128/80  06/30/16 132/84  02/15/16 136/80    Wt Readings from Last 3 Encounters:  08/22/16 169 lb (76.7 kg)  06/30/16 166 lb (75.3 kg)  02/15/16 166 lb (75.3 kg)    Physical Exam  Constitutional: She appears well-developed. No distress.  HENT:  Head: Normocephalic.  Right Ear: External ear normal.  Left Ear: External ear normal.  Nose: Nose normal.  Mouth/Throat: Oropharynx is clear and moist.  Eyes: Conjunctivae are normal. Pupils are equal, round, and reactive to light. Right eye exhibits no discharge. Left eye exhibits no discharge.  Neck: Normal range of motion. Neck supple. No JVD present. No tracheal deviation present.  No thyromegaly present.  Cardiovascular: Normal rate, regular rhythm and normal heart sounds.   Pulmonary/Chest: No stridor. No respiratory distress. She has no wheezes.  Abdominal: Soft. Bowel sounds are normal. She exhibits no distension and no mass. There is no tenderness. There is no rebound and no guarding.  Musculoskeletal: She exhibits no edema or tenderness.  Lymphadenopathy:    She has no cervical adenopathy.  Neurological: She displays normal reflexes. No cranial nerve deficit. She exhibits normal muscle tone. Coordination normal.    Skin: No rash noted. No erythema.  Psychiatric: She has a normal mood and affect. Her behavior is normal. Judgment and thought content normal.  AK on nose    Procedure Note :     Procedure : Cryosurgery   Indication:  Actinic keratosis(es)   Risks including unsuccessful procedure , bleeding, infection, bruising, scar, a need for a repeat  procedure and others were explained to the patient in detail as well as the benefits. Informed consent was obtained verbally.    1 lesion(s)  on nose  was/were treated with liquid nitrogen on a Q-tip in a usual fasion . Band-Aid was applied and antibiotic ointment was given for a later use.   Tolerated well. Complications none.   Postprocedure instructions :     Keep the wounds clean. You can wash them with liquid soap and water. Pat dry with gauze or a Kleenex tissue  Before applying antibiotic ointment and a Band-Aid.   You need to report immediately  if  any signs of infection develop.     Lab Results  Component Value Date   WBC 5.6 02/15/2016   HGB 13.1 02/15/2016   HCT 39.8 02/15/2016   PLT 249.0 02/15/2016   GLUCOSE 92 02/15/2016   CHOL 162 02/15/2016   TRIG 153.0 (H) 02/15/2016   HDL 58.50 02/15/2016   LDLCALC 73 02/15/2016   ALT 22 02/15/2016   AST 19 02/15/2016   NA 136 02/15/2016   K 4.0 02/15/2016   CL 101 02/15/2016   CREATININE 0.66 02/15/2016   BUN 8 02/15/2016   CO2 27 02/15/2016   TSH 2.23 02/15/2016   HGBA1C 5.8 02/15/2016    Mm Screening Breast Tomo Bilateral  Result Date: 10/28/2015 CLINICAL DATA:  Screening. EXAM: DIGITAL SCREENING BILATERAL MAMMOGRAM WITH 3D TOMO WITH CAD COMPARISON:  Previous exam(s). ACR Breast Density Category b: There are scattered areas of fibroglandular density. FINDINGS: There are no findings suspicious for malignancy. Images were processed with CAD. IMPRESSION: No mammographic evidence of malignancy. A result letter of this screening mammogram will be mailed directly to the patient.  RECOMMENDATION: Screening mammogram in one year. (Code:SM-B-01Y) BI-RADS CATEGORY  1: Negative. Electronically Signed   By: Dorise Bullion III M.D   On: 10/28/2015 18:14    Assessment & Plan:   There are no diagnoses linked to this encounter. I am having Ms. Sangalang maintain her Vitamin D-3, Fish Oil, cetirizine, esomeprazole, fluticasone, meclizine, OVER THE COUNTER MEDICATION, b complex vitamins, PROBIOTIC PEARLS, losartan-hydrochlorothiazide, dicyclomine, and NONFORMULARY OR COMPOUNDED ITEM.  No orders of the defined types were placed in this encounter.    Follow-up: No Follow-up on file.  Walker Kehr, MD

## 2016-09-21 ENCOUNTER — Other Ambulatory Visit: Payer: Self-pay | Admitting: Internal Medicine

## 2016-09-21 DIAGNOSIS — Z1231 Encounter for screening mammogram for malignant neoplasm of breast: Secondary | ICD-10-CM

## 2016-09-25 HISTORY — PX: OTHER SURGICAL HISTORY: SHX169

## 2016-11-01 ENCOUNTER — Ambulatory Visit
Admission: RE | Admit: 2016-11-01 | Discharge: 2016-11-01 | Disposition: A | Discharge status: Home / Self Care | Payer: BLUE CROSS/BLUE SHIELD | Source: Ambulatory Visit | Attending: Internal Medicine | Admitting: Internal Medicine

## 2016-11-01 DIAGNOSIS — Z1231 Encounter for screening mammogram for malignant neoplasm of breast: Secondary | ICD-10-CM

## 2017-02-20 ENCOUNTER — Other Ambulatory Visit (INDEPENDENT_AMBULATORY_CARE_PROVIDER_SITE_OTHER): Payer: BLUE CROSS/BLUE SHIELD

## 2017-02-20 ENCOUNTER — Ambulatory Visit (INDEPENDENT_AMBULATORY_CARE_PROVIDER_SITE_OTHER): Payer: BLUE CROSS/BLUE SHIELD | Admitting: Internal Medicine

## 2017-02-20 ENCOUNTER — Encounter: Payer: Self-pay | Admitting: Internal Medicine

## 2017-02-20 VITALS — BP 132/84 | HR 72 | Temp 98.2°F | Ht 60.75 in | Wt 168.0 lb

## 2017-02-20 DIAGNOSIS — E119 Type 2 diabetes mellitus without complications: Secondary | ICD-10-CM

## 2017-02-20 DIAGNOSIS — Z0000 Encounter for general adult medical examination without abnormal findings: Secondary | ICD-10-CM

## 2017-02-20 DIAGNOSIS — M150 Primary generalized (osteo)arthritis: Secondary | ICD-10-CM

## 2017-02-20 DIAGNOSIS — M129 Arthropathy, unspecified: Secondary | ICD-10-CM | POA: Diagnosis not present

## 2017-02-20 DIAGNOSIS — M8949 Other hypertrophic osteoarthropathy, multiple sites: Secondary | ICD-10-CM

## 2017-02-20 DIAGNOSIS — M159 Polyosteoarthritis, unspecified: Secondary | ICD-10-CM

## 2017-02-20 LAB — BASIC METABOLIC PANEL
BUN: 11 mg/dL (ref 6–23)
CO2: 28 mEq/L (ref 19–32)
Calcium: 9.8 mg/dL (ref 8.4–10.5)
Chloride: 101 mEq/L (ref 96–112)
Creatinine, Ser: 0.75 mg/dL (ref 0.40–1.20)
GFR: 82.51 mL/min (ref >60.00)
Glucose, Bld: 91 mg/dL (ref 70–99)
Potassium: 4.5 mEq/L (ref 3.5–5.1)
Sodium: 136 mEq/L (ref 135–145)

## 2017-02-20 LAB — CBC WITH DIFFERENTIAL/PLATELET
Basophils Absolute: 0.1 10*3/uL (ref 0.0–0.1)
Basophils Relative: 0.9 % (ref 0.0–3.0)
Eosinophils Absolute: 0.2 10*3/uL (ref 0.0–0.7)
Eosinophils Relative: 2.5 % (ref 0.0–5.0)
HCT: 39.3 % (ref 36.0–46.0)
Hemoglobin: 13.1 g/dL (ref 12.0–15.0)
Lymphocytes Relative: 32.9 % (ref 12.0–46.0)
Lymphs Abs: 2.1 10*3/uL (ref 0.7–4.0)
MCHC: 33.4 g/dL (ref 30.0–36.0)
MCV: 86.7 fl (ref 78.0–100.0)
Monocytes Absolute: 0.5 10*3/uL (ref 0.1–1.0)
Monocytes Relative: 7.6 % (ref 3.0–12.0)
Neutro Abs: 3.5 10*3/uL (ref 1.4–7.7)
Neutrophils Relative %: 56.1 % (ref 43.0–77.0)
Platelets: 267 10*3/uL (ref 150.0–400.0)
RBC: 4.53 Mil/uL (ref 3.87–5.11)
RDW: 13.5 % (ref 11.5–15.5)
WBC: 6.3 10*3/uL (ref 4.0–10.5)

## 2017-02-20 LAB — LIPID PANEL
Cholesterol: 223 mg/dL — ABNORMAL HIGH (ref 0–200)
HDL: 69.6 mg/dL (ref >39.00)
LDL Cholesterol: 125 mg/dL — ABNORMAL HIGH (ref 0–99)
NonHDL: 152.92
Total CHOL/HDL Ratio: 3
Triglycerides: 142 mg/dL (ref 0.0–149.0)
VLDL: 28.4 mg/dL (ref 0.0–40.0)

## 2017-02-20 LAB — HEPATIC FUNCTION PANEL
ALT: 25 U/L (ref 0–35)
AST: 18 U/L (ref 0–37)
Albumin: 4.3 g/dL (ref 3.5–5.2)
Alkaline Phosphatase: 53 U/L (ref 39–117)
Bilirubin, Direct: 0.1 mg/dL (ref 0.0–0.3)
Total Bilirubin: 0.4 mg/dL (ref 0.2–1.2)
Total Protein: 7.4 g/dL (ref 6.0–8.3)

## 2017-02-20 LAB — URINALYSIS
Bilirubin Urine: NEGATIVE
Hgb urine dipstick: NEGATIVE
Ketones, ur: NEGATIVE
Leukocytes, UA: NEGATIVE
Nitrite: NEGATIVE
Specific Gravity, Urine: <=1.005 — AB (ref 1.000–1.030)
Total Protein, Urine: NEGATIVE
Urine Glucose: NEGATIVE
Urobilinogen, UA: 0.2 (ref 0.0–1.0)
pH: 6.5 (ref 5.0–8.0)

## 2017-02-20 LAB — TSH: TSH: 2.32 u[IU]/mL (ref 0.35–4.50)

## 2017-02-20 LAB — HEMOGLOBIN A1C: Hgb A1c MFr Bld: 6 % (ref 4.6–6.5)

## 2017-02-20 NOTE — Assessment & Plan Note (Signed)
On "Golden milk"

## 2017-02-20 NOTE — Progress Notes (Signed)
Subjective:  Patient ID: Jennifer Davenport, female    DOB: 09-Apr-1951  Age: 66 y.o. MRN: [id number]  CC: No chief complaint on file.   HPI Jennifer Davenport presents for a well exam C/o OA pains  Outpatient Medications Prior to Visit  Medication Sig Dispense Refill  . b complex vitamins tablet Take 1 tablet by mouth daily.    . cetirizine (ZYRTEC) 10 MG tablet Take 1 tablet (10 mg total) by mouth daily. 100 tablet 3  . Cholecalciferol (VITAMIN D-3) 1000 UNITS CAPS Take by mouth daily.    [name] dicyclomine (BENTYL) 10 MG capsule Take 1 tab  Every 6-8 hours as needed for cramping and spasms. 90 capsule 3  . esomeprazole (NEXIUM) 40 MG capsule Take 40 mg by mouth daily at 12 noon.    . fluticasone (FLONASE) 50 MCG/ACT nasal spray Place 1-2 sprays into both nostrils daily.     [name] losartan-hydrochlorothiazide (HYZAAR) 100-12.5 MG tablet Take 1 tablet by mouth daily. 90 tablet 3  . NONFORMULARY OR COMPOUNDED ITEM estradiol 0.01% in HRT replacement cream.  Apply 21ml vaginally twice weekly.  #30 grams 1 each 4  . OVER THE COUNTER MEDICATION Take 800 mg by mouth 2 (two) times daily. [facility]    . Probiotic Product (PROBIOTIC PEARLS) CAPS Take 1 capsule by mouth daily.    . meclizine (ANTIVERT) 12.5 MG tablet Take 1 tablet (12.5 mg total) by mouth 3 (three) times daily as needed for dizziness. 60 tablet 1  . Omega-3 Fatty Acids (FISH OIL) 1200 MG CAPS Take 1,400 mg by mouth daily.     No facility-administered medications prior to visit.     ROS Review of Systems  Constitutional: Negative for activity change, appetite change, chills, fatigue and unexpected weight change.  HENT: Negative for congestion, mouth sores and sinus pressure.   Eyes: Negative for visual disturbance.  Respiratory: Negative for cough and chest tightness.   Gastrointestinal: Negative for abdominal pain and nausea.  Genitourinary: Negative for difficulty urinating, frequency and vaginal pain.  Musculoskeletal: Positive for  arthralgias. Negative for back pain and gait problem.  Skin: Negative for pallor and rash.  Neurological: Negative for dizziness, tremors, weakness, numbness and headaches.  Psychiatric/Behavioral: Negative for confusion and sleep disturbance.    Objective:  BP 132/84 (BP Location: Left Arm, Patient Position: Sitting, Cuff Size: Normal)   Pulse 72   Temp 98.2 F (36.8 C) (Oral)   Ht 5' 0.75" (1.543 m)   Wt 168 lb (76.2 kg)   LMP 11/24/1982   SpO2 99%   BMI 32.01 kg/m   BP Readings from Last 3 Encounters:  02/20/17 132/84  08/22/16 128/80  06/30/16 132/84    Wt Readings from Last 3 Encounters:  02/20/17 168 lb (76.2 kg)  08/22/16 169 lb (76.7 kg)  06/30/16 166 lb (75.3 kg)    Physical Exam  Constitutional: She appears well-developed. No distress.  HENT:  Head: Normocephalic.  Right Ear: External ear normal.  Left Ear: External ear normal.  Nose: Nose normal.  Mouth/Throat: Oropharynx is clear and moist.  Eyes: Conjunctivae are normal. Pupils are equal, round, and reactive to light. Right eye exhibits no discharge. Left eye exhibits no discharge.  Neck: Normal range of motion. Neck supple. No JVD present. No tracheal deviation present. No thyromegaly present.  Cardiovascular: Normal rate, regular rhythm and normal heart sounds.   Pulmonary/Chest: No stridor. No respiratory distress. She has no wheezes.  Abdominal: Soft. Bowel sounds are normal. She exhibits  no distension and no mass. There is no tenderness. There is no rebound and no guarding.  Musculoskeletal: She exhibits no edema or tenderness.  Lymphadenopathy:    She has no cervical adenopathy.  Neurological: She displays normal reflexes. No cranial nerve deficit. She exhibits normal muscle tone. Coordination normal.  Skin: No rash noted. No erythema.  Psychiatric: She has a normal mood and affect. Her behavior is normal. Judgment and thought content normal.  obese  Lab Results  Component Value Date   WBC 5.6  02/15/2016   HGB 13.1 02/15/2016   HCT 39.8 02/15/2016   PLT 249.0 02/15/2016   GLUCOSE 84 08/22/2016   CHOL 162 02/15/2016   TRIG 153.0 (H) 02/15/2016   HDL 58.50 02/15/2016   LDLCALC 73 02/15/2016   ALT 22 02/15/2016   AST 19 02/15/2016   NA 134 (L) 08/22/2016   K 4.0 08/22/2016   CL 98 08/22/2016   CREATININE 0.64 08/22/2016   BUN 7 08/22/2016   CO2 29 08/22/2016   TSH 2.23 02/15/2016   HGBA1C 5.5 08/22/2016    Mm Screening Breast Tomo Bilateral  Result Date: 11/01/2016 CLINICAL DATA:  Screening. EXAM: 2D DIGITAL SCREENING BILATERAL MAMMOGRAM WITH CAD AND ADJUNCT TOMO COMPARISON:  Previous exam(s). ACR Breast Density Category b: There are scattered areas of fibroglandular density. FINDINGS: There are no findings suspicious for malignancy. Images were processed with CAD. IMPRESSION: No mammographic evidence of malignancy. A result letter of this screening mammogram will be mailed directly to the patient. RECOMMENDATION: Screening mammogram in one year. (Code:SM-B-01Y) BI-RADS CATEGORY  1: Negative. Electronically Signed   By: Franki Cabot M.D.   On: 11/01/2016 09:06    Assessment & Plan:   There are no diagnoses linked to this encounter. I have discontinued Ms. Oliger's Fish Oil and meclizine. I am also having her maintain her Vitamin D-3, cetirizine, esomeprazole, fluticasone, OVER THE COUNTER MEDICATION, b complex vitamins, PROBIOTIC PEARLS, losartan-hydrochlorothiazide, dicyclomine, and NONFORMULARY OR COMPOUNDED ITEM.  No orders of the defined types were placed in this encounter.    Follow-up: No Follow-up on file.  Walker Kehr, MD

## 2017-02-20 NOTE — Assessment & Plan Note (Addendum)
We discussed age appropriate health related issues, including available/recomended screening tests and vaccinations. We discussed a need for adhering to healthy diet and exercise. Labs were ordered to be later reviewed . All questions were answered. Colon 2014 Shingrix - refused

## 2017-02-20 NOTE — Assessment & Plan Note (Signed)
labs

## 2017-02-25 ENCOUNTER — Other Ambulatory Visit: Payer: Self-pay | Admitting: Internal Medicine

## 2017-03-15 ENCOUNTER — Other Ambulatory Visit: Payer: Self-pay | Admitting: Internal Medicine

## 2017-03-15 NOTE — Telephone Encounter (Signed)
MD out of office pls advise ok to refill...Jennifer Davenport

## 2017-04-16 ENCOUNTER — Telehealth: Payer: Self-pay | Admitting: Obstetrics and Gynecology

## 2017-04-16 NOTE — Telephone Encounter (Signed)
LMTCB/:NP/ .CX/LETTER SENT/RD

## 2017-05-30 DIAGNOSIS — M21621 Bunionette of right foot: Secondary | ICD-10-CM | POA: Diagnosis not present

## 2017-05-30 DIAGNOSIS — M21611 Bunion of right foot: Secondary | ICD-10-CM | POA: Diagnosis not present

## 2017-05-30 DIAGNOSIS — M21622 Bunionette of left foot: Secondary | ICD-10-CM | POA: Diagnosis not present

## 2017-05-30 DIAGNOSIS — M79671 Pain in right foot: Secondary | ICD-10-CM | POA: Diagnosis not present

## 2017-05-30 DIAGNOSIS — M21612 Bunion of left foot: Secondary | ICD-10-CM | POA: Diagnosis not present

## 2017-05-30 DIAGNOSIS — M79672 Pain in left foot: Secondary | ICD-10-CM | POA: Diagnosis not present

## 2017-06-19 DIAGNOSIS — H53489 Generalized contraction of visual field, unspecified eye: Secondary | ICD-10-CM | POA: Diagnosis not present

## 2017-06-19 DIAGNOSIS — H02413 Mechanical ptosis of bilateral eyelids: Secondary | ICD-10-CM | POA: Diagnosis not present

## 2017-06-19 DIAGNOSIS — H02834 Dermatochalasis of left upper eyelid: Secondary | ICD-10-CM | POA: Diagnosis not present

## 2017-06-19 DIAGNOSIS — H02831 Dermatochalasis of right upper eyelid: Secondary | ICD-10-CM | POA: Diagnosis not present

## 2017-06-25 DIAGNOSIS — H5213 Myopia, bilateral: Secondary | ICD-10-CM | POA: Diagnosis not present

## 2017-06-25 DIAGNOSIS — H31003 Unspecified chorioretinal scars, bilateral: Secondary | ICD-10-CM | POA: Diagnosis not present

## 2017-06-25 DIAGNOSIS — H02831 Dermatochalasis of right upper eyelid: Secondary | ICD-10-CM | POA: Diagnosis not present

## 2017-06-25 DIAGNOSIS — H2513 Age-related nuclear cataract, bilateral: Secondary | ICD-10-CM | POA: Diagnosis not present

## 2017-07-06 ENCOUNTER — Ambulatory Visit (INDEPENDENT_AMBULATORY_CARE_PROVIDER_SITE_OTHER): Payer: Medicare Other | Admitting: Certified Nurse Midwife

## 2017-07-06 ENCOUNTER — Encounter: Payer: Self-pay | Admitting: Certified Nurse Midwife

## 2017-07-06 VITALS — BP 118/72 | HR 70 | Resp 16 | Ht 60.75 in | Wt 151.0 lb

## 2017-07-06 DIAGNOSIS — N952 Postmenopausal atrophic vaginitis: Secondary | ICD-10-CM | POA: Diagnosis not present

## 2017-07-06 DIAGNOSIS — Z01419 Encounter for gynecological examination (general) (routine) without abnormal findings: Secondary | ICD-10-CM

## 2017-07-06 DIAGNOSIS — Z780 Asymptomatic menopausal state: Secondary | ICD-10-CM

## 2017-07-06 DIAGNOSIS — Z8679 Personal history of other diseases of the circulatory system: Secondary | ICD-10-CM | POA: Diagnosis not present

## 2017-07-06 NOTE — Patient Instructions (Signed)

## 2017-07-06 NOTE — Progress Notes (Signed)
66 y.o. [id number] Married  Caucasian Fe here for annual exam. Menopausal no vaginal bleeding or vaginal dryness. Estrace working well for dryness and sexual activity. Sees PCP yearly and for hypertension management and labs. Still exercises and does yoga regularly. Still volunteering in community. Denies any problems today. Enjoying  Granddaughter who is in college!  Patient's last menstrual period was 11/24/1982.          Sexually active: Yes.    The current method of family planning is status post hysterectomy.    Exercising: Yes.    cardio & yoga Smoker:  no  Health Maintenance: Pap:  04-27-14 neg, 06-30-16 neg HPV HR neg History of Abnormal Pap: yes MMG:  11-01-16 category b density birads 1:neg Self Breast exams: no Colonoscopy:  2014 tubular adenoma & polyp f/u 75yrs BMD:   2014 TDaP:  2014 Shingles: not done Pneumonia: not done Hep C and HIV: Hep c neg 2017 Labs: pcp   reports that she has never smoked. She has never used smokeless tobacco. She reports that she does not drink alcohol or use drugs.  Past Medical History:  Diagnosis Date  . Allergy   . Atrophic vaginitis   . Bulging disc 10/2012   CERVICAL  . Bursitis of hip   . Endometriosis   . GERD (gastroesophageal reflux disease)   . Hyperlipidemia   . Hypertension   . IBS (irritable bowel syndrome)     Past Surgical History:  Procedure Laterality Date  . ABDOMINAL HYSTERECTOMY  11/1982   AUB & endometriosis, ovaries remain  . APPENDECTOMY  1/72  . BUNIONECTOMY Left 04/2004   left foot  . HYSTEROTOMY  5/72   fetal demise, also BTL  . LAPAROSCOPIC CHOLECYSTECTOMY  02/2005  . TMJ ARTHROPLASTY  6/94   secondary to hockey injury 15 years earlier  . TUBAL LIGATION Bilateral 5/72    Current Outpatient Prescriptions  Medication Sig Dispense Refill  . b complex vitamins tablet Take 1 tablet by mouth daily.    . cetirizine (ZYRTEC) 10 MG tablet Take 1 tablet (10 mg total) by mouth daily. 100 tablet 3  . Cholecalciferol  (VITAMIN D-3) 1000 UNITS CAPS Take by mouth daily.    [name] dicyclomine (BENTYL) 10 MG capsule TAKE ONE CAPSULE BY MOUTH EVERY 6 TO 8 HOURS AS NEEDED FOR CRAMPING AND SPASMS 90 capsule 0  . fluticasone (FLONASE) 50 MCG/ACT nasal spray Place 1-2 sprays into both nostrils daily.     [name] losartan-hydrochlorothiazide (HYZAAR) 100-12.5 MG tablet TAKE ONE TABLET BY MOUTH DAILY 90 tablet 2  . NONFORMULARY OR COMPOUNDED ITEM estradiol 0.01% in HRT replacement cream.  Apply 35ml vaginally twice weekly.  #30 grams 1 each 4  . OVER THE COUNTER MEDICATION Take 800 mg by mouth 2 (two) times daily. [facility]    . Probiotic Product (PROBIOTIC PEARLS) CAPS Take 1 capsule by mouth daily.    [name] UNABLE TO FIND Betaine hydrochloride     No current facility-administered medications for this visit.     Family History  Problem Relation Age of Onset  . Diverticulitis Mother   . Breast cancer Mother 46  . Hypertension Mother   . Heart disease Mother   . Osteoarthritis Mother   . Heart disease Brother   . Hypertension Brother   . Lung cancer Brother 18       lung cancer, MI, ETOH  . Heart disease Sister   . Hypertension Sister   . Osteoarthritis Sister   . Hypertension Father   .  Hypertension Maternal Uncle   . Heart disease Maternal Uncle   . Hypertension Maternal Grandfather   . Heart disease Maternal Grandfather     ROS:  Pertinent items are noted in HPI.  Otherwise, a comprehensive ROS was negative.  Exam:   BP 118/72   Pulse 70   Resp 16   Ht 5' 0.75" (1.543 m)   Wt 151 lb (68.5 kg)   LMP 11/24/1982   BMI 28.77 kg/m  Height: 5' 0.75" (154.3 cm) Ht Readings from Last 3 Encounters:  07/06/17 5' 0.75" (1.543 m)  02/20/17 5' 0.75" (1.543 m)  08/22/16 5' 0.75" (1.543 m)    General appearance: alert, cooperative and appears stated age Head: Normocephalic, without obvious abnormality, atraumatic Neck: no adenopathy, supple, symmetrical, trachea midline and thyroid normal to inspection and  palpation Lungs: clear to auscultation bilaterally Breasts: normal appearance, no masses or tenderness, No nipple retraction or dimpling, No nipple discharge or bleeding, No axillary or supraclavicular adenopathy Heart: regular rate and rhythm Abdomen: soft, non-tender; no masses,  no organomegaly Extremities: extremities normal, atraumatic, no cyanosis or edema Skin: Skin color, texture, turgor normal. No rashes or lesions Lymph nodes: Cervical, supraclavicular, and axillary nodes normal. No abnormal inguinal nodes palpated Neurologic: Grossly normal   Pelvic: External genitalia:  no lesions              Urethra:  normal appearing urethra with no masses, tenderness or lesions              Bartholin's and Skene's: normal                 Vagina: normal appearing vagina with normal color and discharge, no lesions              Cervix: absent              Pap taken: No. Bimanual Exam:  Uterus:  uterus absent              Adnexa: normal adnexa and no mass, fullness, tenderness               Rectovaginal: Confirms               Anus:  normal sphincter tone, no lesions  Chaperone present: yes  A:  Well Woman with normal exam  Menopausal no HRT   Atrophic vaginitis with Estrace  Cream use desires continuance  Hypertension with PCP managment  P:   Reviewed health and wellness pertinent to exam  Aware of need to advise if vaginal bleeding  Rx Estrace see order with instructions  Continue follow up with PCP as needed  Pap smear: no   counseled on breast self exam, mammography screening, osteoporosis, adequate intake of calcium and vitamin D, diet and exercise  return annually or prn  An After Visit Summary was printed and given to the patient.

## 2017-07-10 ENCOUNTER — Ambulatory Visit: Payer: BLUE CROSS/BLUE SHIELD | Admitting: Nurse Practitioner

## 2017-07-17 DIAGNOSIS — H02834 Dermatochalasis of left upper eyelid: Secondary | ICD-10-CM | POA: Diagnosis not present

## 2017-07-17 DIAGNOSIS — H02831 Dermatochalasis of right upper eyelid: Secondary | ICD-10-CM | POA: Diagnosis not present

## 2017-07-19 ENCOUNTER — Emergency Department (HOSPITAL_BASED_OUTPATIENT_CLINIC_OR_DEPARTMENT_OTHER)
Admission: EM | Admit: 2017-07-19 | Discharge: 2017-07-19 | Disposition: A | Discharge status: Home / Self Care | Payer: Medicare Other | Attending: Emergency Medicine | Admitting: Emergency Medicine

## 2017-07-19 ENCOUNTER — Encounter (HOSPITAL_BASED_OUTPATIENT_CLINIC_OR_DEPARTMENT_OTHER): Payer: Self-pay | Admitting: Emergency Medicine

## 2017-07-19 ENCOUNTER — Emergency Department (HOSPITAL_BASED_OUTPATIENT_CLINIC_OR_DEPARTMENT_OTHER): Payer: Medicare Other

## 2017-07-19 DIAGNOSIS — R202 Paresthesia of skin: Secondary | ICD-10-CM

## 2017-07-19 DIAGNOSIS — R109 Unspecified abdominal pain: Secondary | ICD-10-CM

## 2017-07-19 DIAGNOSIS — R064 Hyperventilation: Secondary | ICD-10-CM | POA: Diagnosis not present

## 2017-07-19 DIAGNOSIS — R100 Acute abdomen: Secondary | ICD-10-CM | POA: Diagnosis not present

## 2017-07-19 DIAGNOSIS — E119 Type 2 diabetes mellitus without complications: Secondary | ICD-10-CM | POA: Insufficient documentation

## 2017-07-19 DIAGNOSIS — R0682 Tachypnea, not elsewhere classified: Secondary | ICD-10-CM

## 2017-07-19 DIAGNOSIS — Z79899 Other long term (current) drug therapy: Secondary | ICD-10-CM | POA: Insufficient documentation

## 2017-07-19 DIAGNOSIS — R1030 Lower abdominal pain, unspecified: Secondary | ICD-10-CM | POA: Diagnosis not present

## 2017-07-19 DIAGNOSIS — I10 Essential (primary) hypertension: Secondary | ICD-10-CM | POA: Diagnosis not present

## 2017-07-19 LAB — LIPASE, BLOOD: Lipase: 56 U/L — ABNORMAL HIGH (ref 11–51)

## 2017-07-19 LAB — CBC WITH DIFFERENTIAL/PLATELET
Basophils Absolute: 0 10*3/uL (ref 0.0–0.1)
Basophils Relative: 0 %
Eosinophils Absolute: 0.1 10*3/uL (ref 0.0–0.7)
Eosinophils Relative: 1 %
HCT: 38.1 % (ref 36.0–46.0)
Hemoglobin: 12.5 g/dL (ref 12.0–15.0)
Lymphocytes Relative: 24 %
Lymphs Abs: 1.4 10*3/uL (ref 0.7–4.0)
MCH: 28.3 pg (ref 26.0–34.0)
MCHC: 32.8 g/dL (ref 30.0–36.0)
MCV: 86.2 fL (ref 78.0–100.0)
Monocytes Absolute: 0.5 10*3/uL (ref 0.1–1.0)
Monocytes Relative: 8 %
Neutro Abs: 4 10*3/uL (ref 1.7–7.7)
Neutrophils Relative %: 67 %
Platelets: 249 10*3/uL (ref 150–400)
RBC: 4.42 MIL/uL (ref 3.87–5.11)
RDW: 12.6 % (ref 11.5–15.5)
WBC: 6 10*3/uL (ref 4.0–10.5)

## 2017-07-19 LAB — COMPREHENSIVE METABOLIC PANEL
ALT: 19 U/L (ref 14–54)
AST: 22 U/L (ref 15–41)
Albumin: 4 g/dL (ref 3.5–5.0)
Alkaline Phosphatase: 43 U/L (ref 38–126)
Anion gap: 7 (ref 5–15)
BUN: 11 mg/dL (ref 6–20)
CO2: 24 mmol/L (ref 22–32)
Calcium: 9.4 mg/dL (ref 8.9–10.3)
Chloride: 103 mmol/L (ref 101–111)
Creatinine, Ser: 0.67 mg/dL (ref 0.44–1.00)
GFR calc Af Amer: >60 mL/min (ref >60)
GFR calc non Af Amer: >60 mL/min (ref >60)
Glucose, Bld: 89 mg/dL (ref 65–99)
Potassium: 3.5 mmol/L (ref 3.5–5.1)
Sodium: 134 mmol/L — ABNORMAL LOW (ref 135–145)
Total Bilirubin: 0.6 mg/dL (ref 0.3–1.2)
Total Protein: 6.9 g/dL (ref 6.5–8.1)

## 2017-07-19 LAB — TROPONIN I
Troponin I: <0.03 ng/mL (ref <0.03)
Troponin I: <0.03 ng/mL (ref <0.03)

## 2017-07-19 MED ORDER — DICYCLOMINE HCL 10 MG PO CAPS
10.0000 mg | ORAL_CAPSULE | Freq: Once | ORAL | Status: AC
  Administered 2017-07-19: 10 mg via ORAL
  Filled 2017-07-19: qty 1

## 2017-07-19 NOTE — ED Notes (Signed)
Patient transported to X-ray 

## 2017-07-19 NOTE — ED Notes (Signed)
ED Provider at bedside. 

## 2017-07-19 NOTE — Discharge Instructions (Signed)

## 2017-07-19 NOTE — ED Notes (Signed)
Family at bedside. 

## 2017-07-19 NOTE — ED Provider Notes (Signed)
[facility] Provider Note  CSN: [id number] Arrival date & time: 07/19/17 4098  Chief Complaint(s) Abdominal Pain  HPI Jennifer Davenport is a 66 y.o. female with a history of HTN, HLD, IBS, and anxiety here for cramping abdominal pain that occurred with having a bowel movement, followed by chills, hyperventilating and paresthesia.   The history is provided by the patient.  Abdominal Pain   This is a recurrent problem. The current episode started less than 1 hour ago. The problem occurs constantly. The problem has been resolved. Associated with: BM. The pain is located in the suprapubic region. The quality of the pain is cramping and colicky. The pain is moderate. Pertinent negatives include fever, diarrhea, hematochezia, melena, nausea, vomiting, constipation, dysuria, frequency, headaches and myalgias. Nothing aggravates the symptoms. Relieved by: self resolved. Her past medical history is significant for irritable bowel syndrome.  Neurologic Problem  This is a new (numbness and tingling in all four extremities) problem. The current episode started less than 1 hour ago. The problem occurs constantly. The problem has been resolved. Associated symptoms include chest pain (intermittent, brief, sharp shooting pain from abdomen to chest. resolved), abdominal pain and shortness of breath. Pertinent negatives include no headaches. Nothing aggravates the symptoms. Relieved by: theraputic breathing techniques.   Husband reports that the patient was "worked up earlier in the morning due to the political situation."  Pt is now completely asymptomatic. She is however concerned that this might be an allergic reaction to medicine she was given for blepharoplasty 2 days ago or a stroke. She denies any new medication recently.  Past Medical History Past Medical History:  Diagnosis Date  . Abnormal Pap smear of cervix   . Allergy   . Atrophic vaginitis   . Bulging disc 10/2012    CERVICAL  . Bursitis of hip   . Endometriosis   . GERD (gastroesophageal reflux disease)   . Hyperlipidemia   . Hypertension   . IBS (irritable bowel syndrome)    Patient Active Problem List   Diagnosis Date Noted  . Actinic keratosis 08/22/2016  . DM2 (diabetes mellitus, type 2) ([place]) 08/22/2015  . Carotid bruit 08/17/2015  . Well adult exam 02/11/2015  . Benign paroxysmal positional vertigo 04/06/2014  . NUMBNESS, HAND 03/06/2007  . Dyslipidemia 09/28/2006  . Essential hypertension 09/28/2006  . ALLERGIC RHINITIS 09/28/2006  . 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 GERD 09/28/2006  . Irritable bowel syndrome 09/28/2006  . Osteoarthritis 09/28/2006  . Arthropathy 09/28/2006  . LOW BACK PAIN 09/28/2006   Home Medication(s) Prior to Admission medications   Medication Sig Start Date End Date Taking? Authorizing Provider  b complex vitamins tablet Take 1 tablet by mouth daily.    [provider]  cetirizine (ZYRTEC) 10 MG tablet Take 1 tablet (10 mg total) by mouth daily. 01/30/14   Plotnikov, Evie Lacks, MD  Cholecalciferol (VITAMIN D-3) 1000 UNITS CAPS Take by mouth daily.    [provider]  dicyclomine (BENTYL) 10 MG capsule TAKE ONE CAPSULE BY MOUTH EVERY 6 TO 8 HOURS AS NEEDED FOR CRAMPING AND SPASMS 03/15/17   Janith Lima, MD  fluticasone (FLONASE) 50 MCG/ACT nasal spray Place 1-2 sprays into both nostrils daily.     [provider]  losartan-hydrochlorothiazide (HYZAAR) 100-12.5 MG tablet TAKE ONE TABLET BY MOUTH DAILY 02/25/17   Plotnikov, Evie Lacks, MD  NONFORMULARY OR COMPOUNDED ITEM estradiol 0.01% in HRT replacement cream.  Apply 22ml vaginally twice weekly.  #30 grams  06/30/16   Kem Boroughs, FNP  OVER THE COUNTER MEDICATION Take 800 mg by mouth 2 (two) times daily. [facility]    [provider]  Probiotic Product (PROBIOTIC PEARLS) CAPS Take 1 capsule by mouth daily.    [provider]  UNABLE TO FIND Betaine hydrochloride     [provider]                                                                                                                                    Past Surgical History Past Surgical History:  Procedure Laterality Date  . ABDOMINAL HYSTERECTOMY  11/1982   AUB & endometriosis, ovaries remain  . APPENDECTOMY  1/72  . BUNIONECTOMY Left 04/2004   left foot  . CERVIX LESION DESTRUCTION    . CRYOTHERAPY     for abnormal pap smear  . HYSTEROTOMY  5/72   fetal demise, also BTL  . LAPAROSCOPIC CHOLECYSTECTOMY  02/2005  . TMJ ARTHROPLASTY  6/94   secondary to hockey injury 15 years earlier  . TUBAL LIGATION Bilateral 5/72   Family History Family History  Problem Relation Age of Onset  . Diverticulitis Mother   . Breast cancer Mother 88  . Hypertension Mother   . Heart disease Mother   . Osteoarthritis Mother   . Heart disease Brother   . Hypertension Brother   . Lung cancer Brother 57       lung cancer, MI, ETOH  . Heart disease Sister   . Hypertension Sister   . Osteoarthritis Sister   . Hypertension Father   . Hypertension Maternal Uncle   . Heart disease Maternal Uncle   . Hypertension Maternal Grandfather   . Heart disease Maternal Grandfather     Social History Social History  Substance Use Topics  . Smoking status: Never Smoker  . Smokeless tobacco: Never Used  . Alcohol use No   Allergies Ceftriaxone sodium; Biaxin [clarithromycin]; Sulfonamide derivatives; Aspirin; Ciprofloxacin; Codeine; Dairy aid [lactase]; Gluten meal; Levaquin [levofloxacin in d5w]; Rocephin [ceftriaxone sodium in dextrose]; and Simvastatin  Review of Systems Review of Systems  Constitutional: Negative for fever.  Respiratory: Positive for shortness of breath.   Cardiovascular: Positive for chest pain (intermittent, brief, sharp shooting pain from abdomen to chest. resolved).  Gastrointestinal: Positive for abdominal pain. Negative for constipation, diarrhea, hematochezia, melena,  nausea and vomiting.  Genitourinary: Negative for dysuria and frequency.  Musculoskeletal: Negative for myalgias.  Neurological: Negative for headaches.   All other systems are reviewed and are negative for acute change except as noted in the HPI  Physical Exam Vital Signs  I have reviewed the triage vital signs BP (!) 168/96 (BP Location: Left Arm)   Pulse 60   Temp 97.6 F (36.4 C) (Oral)   Resp 18   Ht 5' (1.524 m)   Wt 68.5 kg (151 lb)   LMP 11/24/1982   SpO2 100%   BMI  29.49 kg/m   Physical Exam  Constitutional: She is oriented to person, place, and time. She appears well-developed and well-nourished. No distress.  HENT:  Head: Normocephalic and atraumatic.  Nose: Nose normal.  Eyes: Pupils are equal, round, and reactive to light. Conjunctivae and EOM are normal. Right eye exhibits no discharge. Left eye exhibits no discharge. No scleral icterus.    Neck: Normal range of motion. Neck supple.  Cardiovascular: Normal rate and regular rhythm.  Exam reveals no gallop and no friction rub.   No murmur heard. Pulmonary/Chest: Effort normal and breath sounds normal. No stridor. No respiratory distress. She has no rales.  Abdominal: Soft. She exhibits no distension. There is no tenderness. There is no rigidity, no rebound, no guarding and no CVA tenderness.  Musculoskeletal: She exhibits no edema or tenderness.  Neurological: She is alert and oriented to person, place, and time.  Mental Status:  Alert and oriented to person, place, and time.  Attention and concentration normal.  Speech clear.  Recent memory is intact  Cranial Nerves:  II Visual Fields: Intact to confrontation. Visual fields intact. III, IV, VI: Pupils equal and reactive to light and near. Full eye movement without nystagmus  V Facial Sensation: Normal. No weakness of masticatory muscles  VII: No facial weakness or asymmetry  VIII Auditory Acuity: Grossly normal  IX/X: The uvula is midline; the palate  elevates symmetrically  XI: Normal sternocleidomastoid and trapezius strength  XII: The tongue is midline. No atrophy or fasciculations.   Motor System: Muscle Strength: 5/5 and symmetric in the upper and lower extremities. No pronation or drift.  Muscle Tone: Tone and muscle bulk are normal in the upper and lower extremities.   Reflexes: DTRs: 1+ and symmetrical in all four extremities. No Clonus Coordination: Intact finger-to-nose, heel-to-shin. No tremor.  Sensation: Intact to light touch, and pinprick.  Gait: Routine gait normal.   Skin: Skin is warm and dry. No rash noted. She is not diaphoretic. No erythema.  Psychiatric: She has a normal mood and affect.  Vitals reviewed.   ED Results and Treatments Labs (all labs ordered are listed, but only abnormal results are displayed) Labs Reviewed  COMPREHENSIVE METABOLIC PANEL - Abnormal; Notable for the following:       Result Value   Sodium 134 (*)    All other components within normal limits  LIPASE, BLOOD - Abnormal; Notable for the following:    Lipase 56 (*)    All other components within normal limits  CBC WITH DIFFERENTIAL/PLATELET  TROPONIN I  TROPONIN I                                                                                                                         EKG  EKG Interpretation  Date/Time:  Thursday July 19 2017 10:19:58 EDT Ventricular Rate:  54 PR Interval:    QRS Duration: 94 QT Interval:  437 QTC Calculation: 415 R Axis:   41 Text Interpretation:  Sinus rhythm  Abnormal R-wave progression, early transition Minimal ST depression, anterolateral leads Baseline wander in lead(s) I NO STEMI No old tracing to compare Confirmed by Addison Lank [phone]) on 07/19/2017 10:23:24 AM      Radiology Dg Chest 2 View  Result Date: 07/19/2017 CLINICAL DATA:  Tachypnea EXAM: CHEST  2 VIEW COMPARISON:  March 15, 2006 FINDINGS: Lungs are clear. Heart size and pulmonary vascularity are normal. No adenopathy.  There is mild degenerative change in the thoracic spine. IMPRESSION: No edema or consolidation. Electronically Signed   By: Lowella Grip III M.D.   On: 07/19/2017 10:38   Pertinent labs & imaging results that were available during my care of the patient were reviewed by me and considered in my medical decision making (see chart for details).  Medications Ordered in ED Medications  dicyclomine (BENTYL) capsule 10 mg (not administered)                                                                                                                                    Procedures Procedures  (including critical care time)  Medical Decision Making / ED Course I have reviewed the nursing notes for this encounter and the patient's prior records (if available in EHR or on provided paperwork).       1. Abd cramping. 10:20 AM Benign abd. Asymptomatic now. Likely colicky pain from IBS. Low suspicion for diverticulitis, appendicitis, SBO, mesenteric ischemia. Will obtain screening labs. No indication of imaging at this time.  11:15 AM Pt reported recurrence of the abd cramping. Will given bentyl.  1:30 PM Complete resolution of her abd discomfort.  2. SOB, chest pain, paresthesia 10:20 AM Feel this is most consistent with anxiety/panick attack. Chest is atypical in nature and highly inconsistent with ACS. EKG without evidence of acute ischemia or pericarditis.  Initial troponin negative. HEAR <4. Will delta trop.  Chest x-ray without evidence suggestive of pneumonia, pneumothorax, pneumomediastinum.  No abnormal contour of the mediastinum to suggest dissection. No evidence of acute injuries.  Low pretest probability for pulmonary embolism.  Presentation not classic for aortic dissection or esophageal perforation.  No focal deficits on exam concerning for CVA.  1:51 PM Delta trop negative.  The patient is safe for discharge with strict return precautions.   Final Clinical  Impression(s) / ED Diagnoses Final diagnoses:  Tachypnea  Abdominal cramping  Hyperventilating  Paresthesia   Disposition: Discharge  Condition: Good  I have discussed the results, Dx and Tx plan with the patient who expressed understanding and agree(s) with the plan. Discharge instructions discussed at great length. The patient was given strict return precautions who verbalized understanding of the instructions. No further questions at time of discharge.    New Prescriptions   No medications on file    Follow Up: Plotnikov, Evie Lacks, MD [place] [address] [phone]  Schedule an appointment as soon as possible for a visit  in 5-7 days, If symptoms do not improve or  worsen      This chart was dictated using voice recognition software.  Despite best efforts to proofread,  errors can occur which can change the documentation meaning.   Fatima Blank, MD 07/19/17 1352

## 2017-07-19 NOTE — ED Triage Notes (Signed)
Patient was having some abdominal pain  - had BM in the bathroom without difficulty. The patient then started to feel numbness and tingling to all extremities  - patient states that it is now subsiding

## 2017-07-31 ENCOUNTER — Ambulatory Visit (INDEPENDENT_AMBULATORY_CARE_PROVIDER_SITE_OTHER): Payer: Medicare Other | Admitting: General Practice

## 2017-07-31 DIAGNOSIS — Z23 Encounter for immunization: Secondary | ICD-10-CM | POA: Diagnosis not present

## 2017-08-03 ENCOUNTER — Encounter: Payer: Self-pay | Admitting: Internal Medicine

## 2017-08-03 ENCOUNTER — Ambulatory Visit (INDEPENDENT_AMBULATORY_CARE_PROVIDER_SITE_OTHER): Payer: Medicare Other | Admitting: Internal Medicine

## 2017-08-03 DIAGNOSIS — H02403 Unspecified ptosis of bilateral eyelids: Secondary | ICD-10-CM | POA: Diagnosis not present

## 2017-08-03 DIAGNOSIS — K9041 Non-celiac gluten sensitivity: Secondary | ICD-10-CM

## 2017-08-03 DIAGNOSIS — I10 Essential (primary) hypertension: Secondary | ICD-10-CM | POA: Diagnosis not present

## 2017-08-03 DIAGNOSIS — R55 Syncope and collapse: Secondary | ICD-10-CM

## 2017-08-03 DIAGNOSIS — E119 Type 2 diabetes mellitus without complications: Secondary | ICD-10-CM | POA: Diagnosis not present

## 2017-08-03 MED ORDER — LOSARTAN POTASSIUM 50 MG PO TABS: 50.0000 mg | ORAL_TABLET | Freq: Every day | ORAL | 3 refills | Status: DC

## 2017-08-03 NOTE — Assessment & Plan Note (Signed)
On gluten free, milk free and lectin free diet 

## 2017-08-03 NOTE — Assessment & Plan Note (Addendum)
vaso-vagal episode CP likely related to diaphragm/abd spasm. Recent ER visit notes and test results discussed Hyzaar - d/c start Losartan low dose Labs Pt declined a cardiology consult

## 2017-08-03 NOTE — Assessment & Plan Note (Signed)
Lost wt Labs 

## 2017-08-03 NOTE — Progress Notes (Signed)
Subjective:  Patient ID: Jennifer Davenport, female    DOB: May 29, 1951  Age: 66 y.o. MRN: [id number]  CC: No chief complaint on file.   HPI Jennifer Davenport presents for HTN, GERD and diarrhea f/u Pt had eyelid surgery. 2 days later - she had a BM; all of a sudden she developed sweats, CP, hands closed up... EMS was called - went to ER  Per ER: "1. Abd cramping. 10:20 AM Benign abd. Asymptomatic now. Likely colicky pain from IBS. Low suspicion for diverticulitis, appendicitis, SBO, mesenteric ischemia. Will obtain screening labs. No indication of imaging at this time.  11:15 AM Pt reported recurrence of the abd cramping. Will given bentyl.  1:30 PM Complete resolution of her abd discomfort.  2. SOB, chest pain, paresthesia 10:20 AM Feel this is most consistent with anxiety/panick attack. Chest is atypical in nature and highly inconsistent with ACS. EKG without evidence of acute ischemia or pericarditis.  Initial troponin negative. HEAR <4. Will delta trop.  Chest x-ray without evidence suggestive of pneumonia, pneumothorax, pneumomediastinum.  No abnormal contour of the mediastinum to suggest dissection. No evidence of acute injuries."  Outpatient Medications Prior to Visit  Medication Sig Dispense Refill  . b complex vitamins tablet Take 1 tablet by mouth daily.    . Cholecalciferol (VITAMIN D-3) 1000 UNITS CAPS Take by mouth daily.    [name] dicyclomine (BENTYL) 10 MG capsule TAKE ONE CAPSULE BY MOUTH EVERY 6 TO 8 HOURS AS NEEDED FOR CRAMPING AND SPASMS 90 capsule 0  . losartan-hydrochlorothiazide (HYZAAR) 100-12.5 MG tablet TAKE ONE TABLET BY MOUTH DAILY 90 tablet 2  . NONFORMULARY OR COMPOUNDED ITEM estradiol 0.01% in HRT replacement cream.  Apply 62ml vaginally twice weekly.  #30 grams 1 each 4  . OVER THE COUNTER MEDICATION Take 800 mg by mouth 2 (two) times daily. [facility]    . Probiotic Product (PROBIOTIC PEARLS) CAPS Take 1 capsule by mouth daily.    [name] UNABLE TO FIND  Betaine hydrochloride    . cetirizine (ZYRTEC) 10 MG tablet Take 1 tablet (10 mg total) by mouth daily. (Patient not taking: Reported on 08/03/2017) 100 tablet 3  . fluticasone (FLONASE) 50 MCG/ACT nasal spray Place 1-2 sprays into both nostrils daily.     . Omega-3 Fatty Acids (FISH OIL) 1000 MG CAPS      No facility-administered medications prior to visit.     ROS Review of Systems  Constitutional: Negative for activity change, appetite change, chills, fatigue and unexpected weight change.  HENT: Negative for congestion, mouth sores and sinus pressure.   Eyes: Negative for visual disturbance.  Respiratory: Negative for cough and chest tightness.   Gastrointestinal: Positive for diarrhea. Negative for abdominal pain and nausea.  Genitourinary: Negative for difficulty urinating, frequency and vaginal pain.  Musculoskeletal: Negative for back pain and gait problem.  Skin: Negative for pallor and rash.  Neurological: Negative for dizziness, tremors, weakness, numbness and headaches.  Psychiatric/Behavioral: Negative for confusion and sleep disturbance.    Objective:  BP 130/80 (BP Location: Left Arm, Patient Position: Sitting, Cuff Size: Normal)   Pulse 60   Temp 98.2 F (36.8 C) (Oral)   Ht 5' (1.524 m)   Wt 146 lb (66.2 kg)   LMP 11/24/1982   SpO2 100%   BMI 28.51 kg/m   BP Readings from Last 3 Encounters:  08/03/17 130/80  07/19/17 (!) 145/77  07/06/17 118/72    Wt Readings from Last 3 Encounters:  08/03/17 146 lb (66.2 kg)  07/19/17 151 lb (68.5 kg)  07/06/17 151 lb (68.5 kg)    Physical Exam  Constitutional: She appears well-developed. No distress.  HENT:  Head: Normocephalic.  Right Ear: External ear normal.  Left Ear: External ear normal.  Nose: Nose normal.  Mouth/Throat: Oropharynx is clear and moist.  Eyes: Conjunctivae are normal. Pupils are equal, round, and reactive to light. Right eye exhibits no discharge. Left eye exhibits no discharge.  Neck:  Normal range of motion. Neck supple. No JVD present. No tracheal deviation present. No thyromegaly present.  Cardiovascular: Normal rate, regular rhythm and normal heart sounds.  Pulmonary/Chest: No stridor. No respiratory distress. She has no wheezes.  Abdominal: Soft. Bowel sounds are normal. She exhibits no distension and no mass. There is no tenderness. There is no rebound and no guarding.  Musculoskeletal: She exhibits no edema or tenderness.  Lymphadenopathy:    She has no cervical adenopathy.  Neurological: She displays normal reflexes. No cranial nerve deficit. She exhibits normal muscle tone. Coordination normal.  Skin: No rash noted. No erythema.  Psychiatric: She has a normal mood and affect. Her behavior is normal. Judgment and thought content normal.         Lab Results  Component Value Date   WBC 6.0 07/19/2017   HGB 12.5 07/19/2017   HCT 38.1 07/19/2017   PLT 249 07/19/2017   GLUCOSE 89 07/19/2017   CHOL 223 (H) 02/20/2017   TRIG 142.0 02/20/2017   HDL 69.60 02/20/2017   LDLCALC 125 (H) 02/20/2017   ALT 19 07/19/2017   AST 22 07/19/2017   NA 134 (L) 07/19/2017   K 3.5 07/19/2017   CL 103 07/19/2017   CREATININE 0.67 07/19/2017   BUN 11 07/19/2017   CO2 24 07/19/2017   TSH 2.32 02/20/2017   HGBA1C 6.0 02/20/2017    Dg Chest 2 View  Result Date: 07/19/2017 CLINICAL DATA:  Tachypnea EXAM: CHEST  2 VIEW COMPARISON:  March 15, 2006 FINDINGS: Lungs are clear. Heart size and pulmonary vascularity are normal. No adenopathy. There is mild degenerative change in the thoracic spine. IMPRESSION: No edema or consolidation. Electronically Signed   By: Lowella Grip III M.D.   On: 07/19/2017 10:38    Assessment & Plan:   There are no diagnoses linked to this encounter. I am having Jennifer Davenport maintain her Vitamin D-3, cetirizine, fluticasone, OVER THE COUNTER MEDICATION, b complex vitamins, PROBIOTIC PEARLS, NONFORMULARY OR COMPOUNDED ITEM,  losartan-hydrochlorothiazide, dicyclomine, UNABLE TO FIND, and Fish Oil.  Meds ordered this encounter  Medications  . Omega-3 Fatty Acids (FISH OIL) 1000 MG CAPS     Follow-up: No Follow-up on file.  Walker Kehr, MD

## 2017-08-03 NOTE — Assessment & Plan Note (Signed)
S/p repair - healing

## 2017-08-03 NOTE — Assessment & Plan Note (Signed)
Hyzaar - d/c start Losartan

## 2017-09-11 DIAGNOSIS — D485 Neoplasm of uncertain behavior of skin: Secondary | ICD-10-CM | POA: Diagnosis not present

## 2017-09-11 DIAGNOSIS — L719 Rosacea, unspecified: Secondary | ICD-10-CM | POA: Diagnosis not present

## 2017-09-11 DIAGNOSIS — L570 Actinic keratosis: Secondary | ICD-10-CM | POA: Diagnosis not present

## 2017-10-10 ENCOUNTER — Other Ambulatory Visit: Payer: Self-pay | Admitting: Internal Medicine

## 2017-10-10 DIAGNOSIS — Z1231 Encounter for screening mammogram for malignant neoplasm of breast: Secondary | ICD-10-CM

## 2017-10-15 ENCOUNTER — Other Ambulatory Visit: Payer: Self-pay | Admitting: *Deleted

## 2017-10-15 MED ORDER — NONFORMULARY OR COMPOUNDED ITEM: 3 refills | Status: DC

## 2017-10-15 NOTE — Telephone Encounter (Signed)
Medication refill request: Estradiol cream  Last AEX:  07/06/17 DL  Next AEX: 07/09/18  Last MMG (if hormonal medication request): 11/01/16 BIRADS 1 negative  Refill authorized: 06/30/16 #1, 4RF. Today, please advise.

## 2017-10-18 NOTE — Telephone Encounter (Signed)
Spoke with Judson Roch at [facility]. Judson Roch states as of today had not received RX that was sent in on 10/15/17 by Melvia Heaps, CNM. Verbal order given over telephone as written by Melvia Heaps, CNM. Estradiol 0.01% cream, apply 28mL vaginally twice a week. #1, 3RF. Sarah provided refill and advised would fill for patient.   Routing to provider for final review. Will close encounter.

## 2017-10-22 DIAGNOSIS — L570 Actinic keratosis: Secondary | ICD-10-CM | POA: Diagnosis not present

## 2017-11-02 ENCOUNTER — Ambulatory Visit (INDEPENDENT_AMBULATORY_CARE_PROVIDER_SITE_OTHER): Payer: Medicare Other | Admitting: Internal Medicine

## 2017-11-02 ENCOUNTER — Encounter: Payer: Self-pay | Admitting: Internal Medicine

## 2017-11-02 VITALS — BP 138/82 | HR 74 | Temp 97.6°F | Ht 60.0 in | Wt 142.0 lb

## 2017-11-02 DIAGNOSIS — K9041 Non-celiac gluten sensitivity: Secondary | ICD-10-CM | POA: Diagnosis not present

## 2017-11-02 DIAGNOSIS — Z23 Encounter for immunization: Secondary | ICD-10-CM | POA: Diagnosis not present

## 2017-11-02 DIAGNOSIS — E785 Hyperlipidemia, unspecified: Secondary | ICD-10-CM

## 2017-11-02 DIAGNOSIS — I10 Essential (primary) hypertension: Secondary | ICD-10-CM | POA: Diagnosis not present

## 2017-11-02 NOTE — Progress Notes (Signed)
Subjective:  Patient ID: Jennifer Davenport, female    DOB: January 06, 1951  Age: 67 y.o. MRN: [id number]  CC: No chief complaint on file.   HPI Jennifer Davenport presents for HTN  To follow-up of the abdominal pain which is much better on a gluten free and lectin free diet due to diarrhea f/u - better  Outpatient Medications Prior to Visit  Medication Sig Dispense Refill  . b complex vitamins tablet Take 1 tablet by mouth daily.    . cetirizine (ZYRTEC) 10 MG tablet Take 1 tablet (10 mg total) by mouth daily. 100 tablet 3  . Cholecalciferol (VITAMIN D-3) 1000 UNITS CAPS Take by mouth daily.    [name] dicyclomine (BENTYL) 10 MG capsule TAKE ONE CAPSULE BY MOUTH EVERY 6 TO 8 HOURS AS NEEDED FOR CRAMPING AND SPASMS 90 capsule 0  . fluticasone (FLONASE) 50 MCG/ACT nasal spray Place 1-2 sprays into both nostrils daily.     [name] losartan (COZAAR) 50 MG tablet Take 1 tablet (50 mg total) daily by mouth. 90 tablet 3  . NONFORMULARY OR COMPOUNDED ITEM estradiol 0.01% in HRT replacement cream.  Apply 68ml vaginally twice weekly.  #30 grams 1 each 3  . Omega-3 Fatty Acids (FISH OIL) 1000 MG CAPS     . OVER THE COUNTER MEDICATION Take 800 mg by mouth 2 (two) times daily. [facility]    . Probiotic Product (PROBIOTIC PEARLS) CAPS Take 1 capsule by mouth daily.    [name] UNABLE TO FIND Betaine hydrochloride     No facility-administered medications prior to visit.     ROS Review of Systems  Constitutional: Negative for activity change, appetite change, chills, fatigue and unexpected weight change.  HENT: Negative for congestion, mouth sores and sinus pressure.   Eyes: Negative for visual disturbance.  Respiratory: Negative for cough and chest tightness.   Gastrointestinal: Positive for diarrhea. Negative for abdominal pain and nausea.  Genitourinary: Negative for difficulty urinating, frequency and vaginal pain.  Musculoskeletal: Negative for back pain and gait problem.  Skin: Negative for pallor and rash.    Neurological: Negative for dizziness, tremors, weakness, numbness and headaches.  Psychiatric/Behavioral: Negative for confusion and sleep disturbance.    Objective:  BP 138/82 (BP Location: Left Arm, Patient Position: Sitting, Cuff Size: Normal)   Pulse 74   Temp 97.6 F (36.4 C) (Oral)   Ht 5' (1.524 m)   Wt 142 lb (64.4 kg)   LMP 11/24/1982   SpO2 98%   BMI 27.73 kg/m   BP Readings from Last 3 Encounters:  11/02/17 138/82  08/03/17 130/80  07/19/17 (!) 145/77    Wt Readings from Last 3 Encounters:  11/02/17 142 lb (64.4 kg)  08/03/17 146 lb (66.2 kg)  07/19/17 151 lb (68.5 kg)    Physical Exam  Constitutional: She appears well-developed. No distress.  HENT:  Head: Normocephalic.  Right Ear: External ear normal.  Left Ear: External ear normal.  Nose: Nose normal.  Mouth/Throat: Oropharynx is clear and moist.  Eyes: Conjunctivae are normal. Pupils are equal, round, and reactive to light. Right eye exhibits no discharge. Left eye exhibits no discharge.  Neck: Normal range of motion. Neck supple. No JVD present. No tracheal deviation present. No thyromegaly present.  Cardiovascular: Normal rate, regular rhythm and normal heart sounds.  Pulmonary/Chest: No stridor. No respiratory distress. She has no wheezes.  Abdominal: Soft. Bowel sounds are normal. She exhibits no distension and no mass. There is no tenderness. There is no rebound and  no guarding.  Musculoskeletal: She exhibits no edema or tenderness.  Lymphadenopathy:    She has no cervical adenopathy.  Neurological: She displays normal reflexes. No cranial nerve deficit. She exhibits normal muscle tone. Coordination normal.  Skin: No rash noted. No erythema.  Psychiatric: She has a normal mood and affect. Her behavior is normal. Judgment and thought content normal.    Lab Results  Component Value Date   WBC 6.0 07/19/2017   HGB 12.5 07/19/2017   HCT 38.1 07/19/2017   PLT 249 07/19/2017   GLUCOSE 89  07/19/2017   CHOL 223 (H) 02/20/2017   TRIG 142.0 02/20/2017   HDL 69.60 02/20/2017   LDLCALC 125 (H) 02/20/2017   ALT 19 07/19/2017   AST 22 07/19/2017   NA 134 (L) 07/19/2017   K 3.5 07/19/2017   CL 103 07/19/2017   CREATININE 0.67 07/19/2017   BUN 11 07/19/2017   CO2 24 07/19/2017   TSH 2.32 02/20/2017   HGBA1C 6.0 02/20/2017    Dg Chest 2 View  Result Date: 07/19/2017 CLINICAL DATA:  Tachypnea EXAM: CHEST  2 VIEW COMPARISON:  March 15, 2006 FINDINGS: Lungs are clear. Heart size and pulmonary vascularity are normal. No adenopathy. There is mild degenerative change in the thoracic spine. IMPRESSION: No edema or consolidation. Electronically Signed   By: Lowella Grip III M.D.   On: 07/19/2017 10:38    Assessment & Plan:   There are no diagnoses linked to this encounter. I am having Jennifer Davenport maintain her Vitamin D-3, cetirizine, fluticasone, OVER THE COUNTER MEDICATION, b complex vitamins, PROBIOTIC PEARLS, dicyclomine, UNABLE TO FIND, Fish Oil, losartan, and NONFORMULARY OR COMPOUNDED ITEM.  No orders of the defined types were placed in this encounter.    Follow-up: No Follow-up on file.  Walker Kehr, MD

## 2017-11-02 NOTE — Assessment & Plan Note (Signed)
On gluten free, milk free and lectin free diet 

## 2017-11-02 NOTE — Patient Instructions (Signed)
[place] books

## 2017-11-05 ENCOUNTER — Ambulatory Visit
Admission: RE | Admit: 2017-11-05 | Discharge: 2017-11-05 | Disposition: A | Discharge status: Home / Self Care | Payer: Medicare Other | Source: Ambulatory Visit | Attending: Internal Medicine | Admitting: Internal Medicine

## 2017-11-05 DIAGNOSIS — Z1231 Encounter for screening mammogram for malignant neoplasm of breast: Secondary | ICD-10-CM | POA: Diagnosis not present

## 2017-11-05 NOTE — Assessment & Plan Note (Signed)
On losartan

## 2017-11-21 ENCOUNTER — Other Ambulatory Visit (INDEPENDENT_AMBULATORY_CARE_PROVIDER_SITE_OTHER): Payer: Medicare Other

## 2017-11-21 ENCOUNTER — Encounter: Payer: Self-pay | Admitting: Family

## 2017-11-21 ENCOUNTER — Ambulatory Visit (INDEPENDENT_AMBULATORY_CARE_PROVIDER_SITE_OTHER): Payer: Medicare Other | Admitting: Family

## 2017-11-21 ENCOUNTER — Ambulatory Visit: Payer: Self-pay | Admitting: *Deleted

## 2017-11-21 VITALS — BP 138/82 | HR 70 | Temp 98.3°F | Ht 60.0 in | Wt 143.1 lb

## 2017-11-21 DIAGNOSIS — R202 Paresthesia of skin: Secondary | ICD-10-CM

## 2017-11-21 DIAGNOSIS — R55 Syncope and collapse: Secondary | ICD-10-CM | POA: Diagnosis not present

## 2017-11-21 DIAGNOSIS — R200 Anesthesia of skin: Secondary | ICD-10-CM

## 2017-11-21 LAB — CBC WITH DIFFERENTIAL/PLATELET
Basophils Absolute: 0.1 10*3/uL (ref 0.0–0.1)
Basophils Relative: 0.8 % (ref 0.0–3.0)
Eosinophils Absolute: 0.1 10*3/uL (ref 0.0–0.7)
Eosinophils Relative: 1.2 % (ref 0.0–5.0)
HCT: 40.1 % (ref 36.0–46.0)
Hemoglobin: 13.3 g/dL (ref 12.0–15.0)
Lymphocytes Relative: 39.4 % (ref 12.0–46.0)
Lymphs Abs: 3.1 10*3/uL (ref 0.7–4.0)
MCHC: 33.1 g/dL (ref 30.0–36.0)
MCV: 86.5 fl (ref 78.0–100.0)
Monocytes Absolute: 0.5 10*3/uL (ref 0.1–1.0)
Monocytes Relative: 6.9 % (ref 3.0–12.0)
Neutro Abs: 4 10*3/uL (ref 1.4–7.7)
Neutrophils Relative %: 51.7 % (ref 43.0–77.0)
Platelets: 298 10*3/uL (ref 150.0–400.0)
RBC: 4.63 Mil/uL (ref 3.87–5.11)
RDW: 13.3 % (ref 11.5–15.5)
WBC: 7.8 10*3/uL (ref 4.0–10.5)

## 2017-11-21 LAB — COMPREHENSIVE METABOLIC PANEL
ALT: 14 U/L (ref 0–35)
AST: 15 U/L (ref 0–37)
Albumin: 3.7 g/dL (ref 3.5–5.2)
Alkaline Phosphatase: 41 U/L (ref 39–117)
BUN: 7 mg/dL (ref 6–23)
CO2: 28 mEq/L (ref 19–32)
Calcium: 9.3 mg/dL (ref 8.4–10.5)
Chloride: 103 mEq/L (ref 96–112)
Creatinine, Ser: 0.64 mg/dL (ref 0.40–1.20)
GFR: 98.85 mL/min (ref >60.00)
Glucose, Bld: 82 mg/dL (ref 70–99)
Potassium: 4.1 mEq/L (ref 3.5–5.1)
Sodium: 136 mEq/L (ref 135–145)
Total Bilirubin: 0.4 mg/dL (ref 0.2–1.2)
Total Protein: 6.3 g/dL (ref 6.0–8.3)

## 2017-11-21 LAB — MAGNESIUM: Magnesium: 2.1 mg/dL (ref 1.5–2.5)

## 2017-11-21 LAB — VITAMIN B12: Vitamin B-12: 243 pg/mL (ref 211–911)

## 2017-11-21 LAB — TSH: TSH: 1.94 u[IU]/mL (ref 0.35–4.50)

## 2017-11-21 NOTE — Telephone Encounter (Signed)
Pt states that she passed out last night 11/20/17; she states that she broke into a sweat and was feeling clammy, and everything went dark; her arms, feet , and legs were tingling like they did when she had a previous event like this in October 2018; she states that her BP was 90/61 pulse of 71 at 1900; 94/61 pulse was 73 at 1910; 93/62 pulse was 66 at 1920; she says her BP is normally 116-130/70-80; she says that she also donated plasma yesterday; nurse triage initiated and recommendations made to include going to ED or PCP; pt opted to be seen in office; pt offered and accepted appointment with Jodi Mourning today at [facility] at 1040; she verbalizes understanding; will route to office for notification of this upcoming appointment.  Reason for Disposition . [1] Age > 50 years  AND [2] now alert and feels fine  Answer Assessment - Initial Assessment Questions 1. ONSET: "How long were you unconscious?" (minutes) "When did it happen?"     App 1 minute 2. CONTENT: "What happened during period of unconsciousness?" (e.g., seizure activity)      unsure 3. MENTAL STATUS: "Alert and oriented now?" (oriented x 3 = name, month, location)      Oriented x 3 4. TRIGGER: "What do you think caused the fainting?" "What were you doing just before you fainted?"  (e.g., exercise, sudden standing up, prolonged standing)     Not sure 5. RECURRENT SYMPTOM: "Have you ever passed out before?" If so, ask: "When was the last time?" and "What happened that time?"      Yes October 2018 6. INJURY: "Did you sustain any injury during the fall?"      no 7. CARDIAC SYMPTOMS: "Have you had any of the following symptoms: chest pain, difficulty breathing, palpitations?"     Feeling like she could not get a deep breath 8. NEUROLOGIC SYMPTOMS: "Have you had any of the following symptoms: headache, numbness, vertigo, weakness?"     Tingling and numbness in arms, feet , and legs  9. GI SYMPTOMS: "Have you had any of the following  symptoms: abdominal pain, vomiting, diarrhea, blood in stools?"     Nausea after passing out 10. OTHER SYMPTOMS: "Do you have any other symptoms?"  felt tired afterwards 11. PREGNANCY: "Is there any chance you are pregnant?" "When was your last menstrual period?"       no hysterectomy  Protocols used: [facility]

## 2017-11-21 NOTE — Progress Notes (Signed)
Jennifer Davenport is a 67 y.o. female with the following history as recorded in EpicCare:  Patient Active Problem List   Diagnosis Date Noted  . Near syncope 08/03/2017  . Gluten intolerance 08/03/2017  . Droopy eyelid, bilateral 08/03/2017  . Actinic keratosis 08/22/2016  . Carotid bruit 08/17/2015  . Well adult exam 02/11/2015  . Benign paroxysmal positional vertigo 04/06/2014  . NUMBNESS, HAND 03/06/2007  . Dyslipidemia 09/28/2006  . Essential hypertension 09/28/2006  . ALLERGIC RHINITIS 09/28/2006  . 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 GERD 09/28/2006  . Irritable bowel syndrome 09/28/2006  . Osteoarthritis 09/28/2006  . Arthropathy 09/28/2006  . LOW BACK PAIN 09/28/2006    Current Outpatient Medications  Medication Sig Dispense Refill  . Azelaic Acid 15 % cream Apply topically 1 day or 1 dose. After skin is thoroughly washed and patted dry, gently but thoroughly massage a thin film of azelaic acid cream into the affected area twice daily, in the morning and evening.    [name] b complex vitamins tablet Take 1 tablet by mouth daily.    . Cholecalciferol (VITAMIN D-3) 1000 UNITS CAPS Take by mouth daily.    [name] dicyclomine (BENTYL) 10 MG capsule TAKE ONE CAPSULE BY MOUTH EVERY 6 TO 8 HOURS AS NEEDED FOR CRAMPING AND SPASMS 90 capsule 0  . fluticasone (FLONASE) 50 MCG/ACT nasal spray Place 1-2 sprays into both nostrils daily.     [name] losartan (COZAAR) 50 MG tablet Take 1 tablet (50 mg total) daily by mouth. 90 tablet 3  . NONFORMULARY OR COMPOUNDED ITEM estradiol 0.01% in HRT replacement cream.  Apply 68m vaginally twice weekly.  #30 grams 1 each 3  . Omega-3 Fatty Acids (FISH OIL) 1000 MG CAPS     . OVER THE COUNTER MEDICATION Take 800 mg by mouth 2 (two) times daily. [facility]   . Probiotic Product (PROBIOTIC PEARLS) CAPS Take 1 capsule by mouth daily.    .Marland KitchenUNABLE TO FIND Betaine hydrochloride    . cetirizine (ZYRTEC) 10 MG tablet Take 1 tablet (10 mg total) by mouth daily. (Patient not  taking: Reported on 11/21/2017) 100 tablet 3   No current facility-administered medications for this visit.     Allergies: Ceftriaxone sodium; Biaxin [clarithromycin]; Sulfonamide derivatives; Aspirin; Ciprofloxacin; Codeine; Dairy aid [lactase]; Gluten meal; Levaquin [levofloxacin in d5w]; Rocephin [ceftriaxone sodium in dextrose]; and Simvastatin  Past Medical History:  Diagnosis Date  . Abnormal Pap smear of cervix   . Allergy   . Atrophic vaginitis   . Bulging disc 10/2012   CERVICAL  . Bursitis of hip   . Endometriosis   . GERD (gastroesophageal reflux disease)   . Hyperlipidemia   . Hypertension   . IBS (irritable bowel syndrome)     Past Surgical History:  Procedure Laterality Date  . ABDOMINAL HYSTERECTOMY  11/1982   AUB & endometriosis, ovaries remain  . APPENDECTOMY  1/72  . BUNIONECTOMY Left 04/2004   left foot  . CERVIX LESION DESTRUCTION    . CRYOTHERAPY     for abnormal pap smear  . HYSTEROTOMY  5/72   fetal demise, also BTL  . LAPAROSCOPIC CHOLECYSTECTOMY  02/2005  . TMJ ARTHROPLASTY  6/94   secondary to hockey injury 15 years earlier  . TUBAL LIGATION Bilateral 5/72    Family History  Problem Relation Age of Onset  . Diverticulitis Mother   . Breast cancer Mother 533 . Hypertension Mother   . Heart disease Mother   . Osteoarthritis  Mother   . Heart disease Brother   . Hypertension Brother   . Lung cancer Brother 9       lung cancer, MI, ETOH  . Heart disease Sister   . Hypertension Sister   . Osteoarthritis Sister   . Hypertension Father   . Hypertension Maternal Uncle   . Heart disease Maternal Uncle   . Hypertension Maternal Grandfather   . Heart disease Maternal Grandfather     Social History   Tobacco Use  . Smoking status: Never Smoker  . Smokeless tobacco: Never Used  Substance Use Topics  . Alcohol use: No    Subjective:  Patient had an episode of passing out last night; per husband, wife was out for a full minute; husband was in the  process of calling EMS when she regained consciousness and told him to cancel the call; opted not to go to the ER; similar episode last fall and at that time was thought to be related to her blood pressure medication; last fall, there was also a thought that she had abdominal spasm that caused some of the symptoms- has been working on diet changes and in general, has been feeling really good;  Notes that she feels "fine today." Of note, patient gave plasma yesterday and her blood pressure was noted to very low yesterday afternoon after giving blood; ( 94/61; 93/62); denies any sense of heart beating funny, no blurred vision or changes in smells prior to episode; just started suddenly feeling "very hot" and then the next think she knew she was waking up;   Objective:  Vitals:   11/21/17 1054  BP: 138/82  Pulse: 70  Temp: 98.3 F (36.8 C)  TempSrc: Oral  SpO2: 99%  Weight: 143 lb 1.9 oz (64.9 kg)  Height: 5' (1.524 m)    General: Well developed, well nourished, in no acute distress  Skin : Warm and dry.  Head: Normocephalic and atraumatic  Eyes: Sclera and conjunctiva clear; pupils round and reactive to light; extraocular movements intact  Ears: External normal; canals clear; tympanic membranes normal  Oropharynx: Pink, supple. No suspicious lesions  Neck: Supple without thyromegaly, adenopathy  Lungs: Respirations unlabored; clear to auscultation bilaterally without wheeze, rales, rhonchi  CVS exam: normal rate and regular rhythm.  Neurologic: Alert and oriented; speech intact; face symmetrical; moves all extremities well; CNII-XII intact without focal deficit  Assessment:  1. Syncope, unspecified syncope type   2. Numbness and tingling     Plan:  Episode most likely due to giving blood yesterday; EKG in office shows NSR; patient has been drinking increased fluids and feels fine today; will update labs today; she does agree to see cardiology since this is second unexplained syncopal  episode in the past 6 months- ? If she may not be getting enough calories/ nutrients on her plant-based diet; may also need to refer to neurology; follow-up to be determined.   No Follow-up on file.  Orders Placed This Encounter  Procedures  . CBC w/Diff    Standing Status:   Future    Number of Occurrences:   1    Standing Expiration Date:   11/21/2018  . Comp Met (CMET)    Standing Status:   Future    Number of Occurrences:   1    Standing Expiration Date:   11/21/2018  . Magnesium    Standing Status:   Future    Number of Occurrences:   1    Standing Expiration Date:  11/21/2018  . TSH    Standing Status:   Future    Number of Occurrences:   1    Standing Expiration Date:   11/21/2018  . B12    Standing Status:   Future    Number of Occurrences:   1    Standing Expiration Date:   11/21/2018  . Ambulatory referral to Cardiology    Referral Priority:   Routine    Referral Type:   Consultation    Referral Reason:   Specialty Services Required    Requested Specialty:   Cardiology    Number of Visits Requested:   1  . EKG 12-Lead    Requested Prescriptions    No prescriptions requested or ordered in this encounter

## 2017-12-05 NOTE — Progress Notes (Signed)
Cardiology Office Note   Date:  12/06/2017   ID:  Jennifer Davenport, DOB 1951/09/19, MRN [id number]  PCP:  Cassandria Anger, MD  Cardiologist:  [facility]  Chief Complaint  Patient presents with  . Loss of Consciousness     History of Present Illness: Jennifer Davenport is a 67 y.o. female who presents for cardiology evaluation in the setting of syncopal episodes at the request of Marrian Salvage, [place].  She has a history of GERD, IBS,  Hypertension, hyperlipidemia. FH of CAD in mother, brother, and sister along with secondary relatives.   The patient has had 2 syncopal episodes one after giving blood which she dismissed as related to reduced blood count afterwards. However this occurred several hours after giving blood after eating dinner and returning dishes to the sink. The patient also had a syncopal episode after undergoing eyelid surgery. Both times she was out for approximately 2-3 minutes and had to be aroused by her husband who had trouble doing so.  The patient often feels heart palpitations with and without exertion, and she thinks she may have felt it prior to passing out but she is uncertain of this. She has had some mild chest pressure but no chest pain. Her energy level has decreased. She has recently lost 28 pounds eating more healthily, feels great, and has had no issues with IBS since changing her diet.  Past Medical History:  Diagnosis Date  . Abnormal Pap smear of cervix   . Allergy   . Atrophic vaginitis   . Bulging disc 10/2012   CERVICAL  . Bursitis of hip   . Endometriosis   . GERD (gastroesophageal reflux disease)   . Hyperlipidemia   . Hypertension   . IBS (irritable bowel syndrome)     Past Surgical History:  Procedure Laterality Date  . ABDOMINAL HYSTERECTOMY  11/1982   AUB & endometriosis, ovaries remain  . APPENDECTOMY  1/72  . BUNIONECTOMY Left 04/2004   left foot  . CERVIX LESION DESTRUCTION    . CRYOTHERAPY     for abnormal pap smear  .  HYSTEROTOMY  5/72   fetal demise, also BTL  . LAPAROSCOPIC CHOLECYSTECTOMY  02/2005  . TMJ ARTHROPLASTY  6/94   secondary to hockey injury 15 years earlier  . TUBAL LIGATION Bilateral 5/72     Current Outpatient Medications  Medication Sig Dispense Refill  . Azelaic Acid 15 % cream Apply topically 1 day or 1 dose. After skin is thoroughly washed and patted dry, gently but thoroughly massage a thin film of azelaic acid cream into the affected area twice daily, in the morning and evening.    [name] b complex vitamins tablet Take 1 tablet by mouth daily.    . cetirizine (ZYRTEC) 10 MG tablet Take 1 tablet (10 mg total) by mouth daily. (Patient not taking: Reported on 11/21/2017) 100 tablet 3  . Cholecalciferol (VITAMIN D-3) 1000 UNITS CAPS Take by mouth daily.    [name] dicyclomine (BENTYL) 10 MG capsule TAKE ONE CAPSULE BY MOUTH EVERY 6 TO 8 HOURS AS NEEDED FOR CRAMPING AND SPASMS 90 capsule 0  . fluticasone (FLONASE) 50 MCG/ACT nasal spray Place 1-2 sprays into both nostrils daily.     [name] losartan (COZAAR) 50 MG tablet Take 1 tablet (50 mg total) daily by mouth. 90 tablet 3  . NONFORMULARY OR COMPOUNDED ITEM estradiol 0.01% in HRT replacement cream.  Apply 64ml vaginally twice weekly.  #30 grams 1 each 3  . Omega-3 Fatty  Acids (FISH OIL) 1000 MG CAPS     . OVER THE COUNTER MEDICATION Take 800 mg by mouth 2 (two) times daily. [facility]    . Probiotic Product (PROBIOTIC PEARLS) CAPS Take 1 capsule by mouth daily.    [name] UNABLE TO FIND Betaine hydrochloride     No current facility-administered medications for this visit.     Allergies:   Ceftriaxone sodium; Biaxin [clarithromycin]; Sulfonamide derivatives; Aspirin; Ciprofloxacin; Codeine; Dairy aid [lactase]; Gluten meal; Levaquin [levofloxacin in d5w]; Rocephin [ceftriaxone sodium in dextrose]; and Simvastatin    Social History:  The patient  reports that  has never smoked. she has never used smokeless tobacco. She reports that she does not drink alcohol  or use drugs.   Family History:  The patient's family history includes Breast cancer (age of onset: 33) in her mother; Diverticulitis in her mother; Heart disease in her brother, maternal grandfather, maternal uncle, mother, and sister; Hypertension in her brother, father, maternal grandfather, maternal uncle, mother, and sister; Lung cancer (age of onset: 3) in her brother; Osteoarthritis in her mother and sister.    ROS: All other systems are reviewed and negative. Unless otherwise mentioned in H&P    PHYSICAL EXAM: VS:  LMP 11/24/1982  , BMI There is no height or weight on file to calculate BMI. GEN: Well nourished, well developed, in no acute distress  HEENT: normal  Neck: no JVD, carotid bruits, or masses Cardiac: RRR; no murmurs, rubs, or gallops,no edema  Respiratory:  clear to auscultation bilaterally, normal work of breathing GI: soft, nontender, nondistended, + BS MS: no deformity or atrophy  Skin: warm and dry, no rash Neuro:  Strength and sensation are intact Psych: euthymic mood, full affect   EKG:  EKG completed on 11/21/2017 at PCP office revealing normal sinus rhythm heart rate of 63 bpm. No evidence of Wellen's or WPW. Heart rate of 63 bpm.  Recent Labs: 11/21/2017: ALT 14; BUN 7; Creatinine, Ser 0.64; Hemoglobin 13.3; Magnesium 2.1; Platelets 298.0; Potassium 4.1; Sodium 136; TSH 1.94    Lipid Panel    Component Value Date/Time   CHOL 223 (H) 02/20/2017 0858   TRIG 142.0 02/20/2017 0858   HDL 69.60 02/20/2017 0858   CHOLHDL 3 02/20/2017 0858   VLDL 28.4 02/20/2017 0858   LDLCALC 125 (H) 02/20/2017 0858      Wt Readings from Last 3 Encounters:  11/21/17 143 lb 1.9 oz (64.9 kg)  11/02/17 142 lb (64.4 kg)  08/03/17 146 lb (66.2 kg)      Other studies Reviewed: Review of recent labs: Sodium 136 potassium 4.1; chloride 103; CO2 28; glucose 82; BUN 7; creatinine 0.64. Hemoglobin 13.3, hematocrit 40.1. White blood cells 7.8, platelets 298. Vitamin B12 243.  TSH 1.94.  ASSESSMENT AND PLAN:  1. Syncopal episodes: The plan echocardiogram for LV systolic function, valvular abnormalities, or structural heart disease. Also placed to a cardiac monitor to evaluate for arrhythmias or pauses causing events.   2. Multiple cardiovascular risk factors: Close family members to include her brother with recent CABG) coronary artery disease, hypertension, uncontrolled hypercholesterolemia, and age. Plan cardiac CT with FFR. Will not add beta blocker to her regimen to take day of CT, as she is bradycardic at this time in the 50s to 60s.  3. Frequent palpitations: As stated above will plan cardiac monitor, give her metoprolol 12.5 mg when necessary for racing heart rate on that side on his own.  4. Uncontrolled hypercholesterolemia: Is recent labs in May 2018  reveal Current medicines are reviewed at length with the patient today reveal total cholesterol 223, HDL 69.6, LDL 125, non-LDL 152.92, triglycerides 142. The patient did not tolerate statin causing significant myalgia pain. May need to consider alternative medication to include Zetia, or Repatha. We'll repeat fasting lipids LFTs.  Labs/ tests ordered today include: Cardiac CT with FFR, echocardiogram, cardiac monitor (2 weeks).    I have discussed this case with Dr. Claiborne Billings who is DOD and the office today. He is in agreement with my assessment and plan. He is gone into the room and introduced himself to the patient. He will see her on follow-up.    Phill Myron. West Pugh, ANP, AACC   12/06/2017 10:17 AM    [facility] [address], [place], [place] [address] Phone: [phone]; Fax: [phone]

## 2017-12-06 ENCOUNTER — Ambulatory Visit (INDEPENDENT_AMBULATORY_CARE_PROVIDER_SITE_OTHER): Payer: Medicare Other | Admitting: Adult Health

## 2017-12-06 ENCOUNTER — Encounter: Payer: Self-pay | Admitting: Adult Health

## 2017-12-06 VITALS — BP 168/89 | HR 66 | Ht 61.0 in | Wt 141.6 lb

## 2017-12-06 DIAGNOSIS — R55 Syncope and collapse: Secondary | ICD-10-CM

## 2017-12-06 DIAGNOSIS — R001 Bradycardia, unspecified: Secondary | ICD-10-CM | POA: Diagnosis not present

## 2017-12-06 DIAGNOSIS — I2510 Atherosclerotic heart disease of native coronary artery without angina pectoris: Secondary | ICD-10-CM | POA: Insufficient documentation

## 2017-12-06 DIAGNOSIS — I10 Essential (primary) hypertension: Secondary | ICD-10-CM | POA: Diagnosis not present

## 2017-12-06 DIAGNOSIS — I25119 Atherosclerotic heart disease of native coronary artery with unspecified angina pectoris: Secondary | ICD-10-CM

## 2017-12-06 DIAGNOSIS — Z79899 Other long term (current) drug therapy: Secondary | ICD-10-CM | POA: Diagnosis not present

## 2017-12-06 DIAGNOSIS — I519 Heart disease, unspecified: Secondary | ICD-10-CM

## 2017-12-06 MED ORDER — METOPROLOL TARTRATE 25 MG PO TABS: 12.5000 mg | ORAL_TABLET | ORAL | 0 refills | Status: DC | PRN

## 2017-12-06 NOTE — Patient Instructions (Addendum)
Medication Instructions:  START METOPROLOL 12.5MG  AS NEEDED FOR PALPITATIONS  If you need a refill on your cardiac medications before your next appointment, please call your pharmacy.  Labwork: FLP AND LFT TODAY HERE IN OUR OFFICE AT [facility]  Testing/Procedures: Echocardiogram - Your physician has requested that you have an echocardiogram. Echocardiography is a painless test that uses sound waves to create images of your heart. It provides your doctor with information about the size and shape of your heart and how well your heart's chambers and valves are working. This procedure takes approximately one hour. There are no restrictions for this procedure. This will be performed at our [facility] location - [address].   Your physician has requested that you have an CT WITH FFR AT [place] physician has recommended that you wear an event monitor. This will be performed at our [facility] location - [address].  Event monitors are medical devices that record the heart's electrical activity. Doctors most often [place] these monitors to diagnose arrhythmias. Arrhythmias are problems with the speed or rhythm of the heartbeat. The monitor is a small, portable device. You can wear one while you do your normal daily activities. This is usually used to diagnose what is causing palpitations/syncope (passing out).   Follow-Up: Your physician wants you to follow-up in: AFTER TESTING WITH DR Claiborne Billings.   Thank you for choosing [facility] at [facility]!!      Please arrive at the [facility] main entrance of [facility] at   AM (30-45 minutes prior to test start time)  [facility] [address] [place], [place] [address] [phone]  Proceed to the [facility] Radiology Department (First Floor).  Please follow these instructions carefully (unless otherwise directed):  Hold all erectile dysfunction medications at least 48 hours prior to  test.  On the Night Before the Test: . Drink plenty of water. . Do not consume any caffeinated/decaffeinated beverages or chocolate 12 hours prior to your test. . Do not take any antihistamines 12 hours prior to your test. . If you take Metformin do not take 24 hours prior to test. . If the patient has contrast allergy: ? Patient will need a prescription for Prednisone and very clear instructions (as follows): 1. Prednisone 50 mg - take 13 hours prior to test 2. Take another Prednisone 50 mg 7 hours prior to test 3. Take another Prednisone 50 mg 1 hour prior to test 4. Take Benadryl 50 mg 1 hour prior to test . Patient must complete all four doses of above prophylactic medications. . Patient will need a ride after test due to Benadryl.  On the Day of the Test: . Drink plenty of water. Do not drink any water within one hour of the test. . Do not eat any food 4 hours prior to the test. . You may take your regular medications prior to the test. . IF NOT ON A BETA BLOCKER - Take 50 mg of lopressor (metoprolol) one hour before the test. . HOLD Furosemide morning of the test.  After the Test: . Drink plenty of water. . After receiving IV contrast, you may experience a mild flushed feeling. This is normal. . On occasion, you may experience a mild rash up to 24 hours after the test. This is not dangerous. If this occurs, you can take Benadryl 25 mg and increase your fluid intake. . If you experience trouble breathing, this  can be serious. If it is severe call 911 IMMEDIATELY. If it is mild, please call our office. . If you take any of these medications: Glipizide/Metformin, Avandament, Glucavance, please do not take 48 hours after completing test.

## 2017-12-07 DIAGNOSIS — Z79899 Other long term (current) drug therapy: Secondary | ICD-10-CM | POA: Diagnosis not present

## 2017-12-07 LAB — LIPID PANEL
Chol/HDL Ratio: 3.1 ratio (ref 0.0–4.4)
Cholesterol, Total: 244 mg/dL — ABNORMAL HIGH (ref 100–199)
HDL: 80 mg/dL (ref >39)
LDL Calculated: 146 mg/dL — ABNORMAL HIGH (ref 0–99)
Triglycerides: 92 mg/dL (ref 0–149)
VLDL Cholesterol Cal: 18 mg/dL (ref 5–40)

## 2017-12-07 LAB — HEPATIC FUNCTION PANEL
ALT: 16 IU/L (ref 0–32)
AST: 16 IU/L (ref 0–40)
Albumin: 4.1 g/dL (ref 3.6–4.8)
Alkaline Phosphatase: 55 IU/L (ref 39–117)
Bilirubin Total: 0.4 mg/dL (ref 0.0–1.2)
Bilirubin, Direct: 0.09 mg/dL (ref 0.00–0.40)
Total Protein: 6.5 g/dL (ref 6.0–8.5)

## 2017-12-10 ENCOUNTER — Other Ambulatory Visit: Payer: Self-pay

## 2017-12-10 MED ORDER — ATORVASTATIN CALCIUM 80 MG PO TABS: 80.0000 mg | ORAL_TABLET | Freq: Every day | ORAL | 3 refills | Status: DC

## 2017-12-13 ENCOUNTER — Telehealth: Payer: Self-pay | Admitting: Adult Health

## 2017-12-13 NOTE — Telephone Encounter (Signed)
Spoke with patient and advised ok to take Metoprolol for Echo and Metoprolol as needed next week. Verbalized understanding.

## 2017-12-13 NOTE — Telephone Encounter (Signed)
New Message     Pt c/o medication issue:  1. Name of Medication:  metoprolol tartrate (LOPRESSOR) 25 MG tablet Take 0.5 tablets (12.5 mg total) by mouth as needed (FOR PALPITATIONS).        2. How are you currently taking this medication (dosage and times per day)?  She is taking twice a day   3. Are you having a reaction (difficulty breathing--STAT)?  no  4. What is your medication issue?  Will taking this medication give a false reading on the test that she is to take next week

## 2017-12-20 ENCOUNTER — Other Ambulatory Visit: Payer: Self-pay

## 2017-12-20 ENCOUNTER — Ambulatory Visit (HOSPITAL_COMMUNITY): Payer: Medicare Other | Attending: Cardiology

## 2017-12-20 ENCOUNTER — Ambulatory Visit (INDEPENDENT_AMBULATORY_CARE_PROVIDER_SITE_OTHER): Payer: Medicare Other

## 2017-12-20 DIAGNOSIS — I340 Nonrheumatic mitral (valve) insufficiency: Secondary | ICD-10-CM | POA: Insufficient documentation

## 2017-12-20 DIAGNOSIS — I519 Heart disease, unspecified: Secondary | ICD-10-CM | POA: Insufficient documentation

## 2017-12-20 DIAGNOSIS — R55 Syncope and collapse: Secondary | ICD-10-CM

## 2017-12-20 DIAGNOSIS — E785 Hyperlipidemia, unspecified: Secondary | ICD-10-CM | POA: Diagnosis not present

## 2017-12-20 DIAGNOSIS — I2510 Atherosclerotic heart disease of native coronary artery without angina pectoris: Secondary | ICD-10-CM | POA: Insufficient documentation

## 2017-12-20 DIAGNOSIS — I10 Essential (primary) hypertension: Secondary | ICD-10-CM | POA: Insufficient documentation

## 2018-01-09 ENCOUNTER — Ambulatory Visit (HOSPITAL_COMMUNITY)
Admission: RE | Admit: 2018-01-09 | Discharge: 2018-01-09 | Disposition: A | Discharge status: Home / Self Care | Payer: Medicare Other | Source: Ambulatory Visit | Attending: Adult Health | Admitting: Adult Health

## 2018-01-09 ENCOUNTER — Ambulatory Visit (HOSPITAL_COMMUNITY): Admission: RE | Admit: 2018-01-09 | Payer: Medicare Other | Source: Ambulatory Visit

## 2018-01-09 DIAGNOSIS — I519 Heart disease, unspecified: Secondary | ICD-10-CM

## 2018-01-09 DIAGNOSIS — I25119 Atherosclerotic heart disease of native coronary artery with unspecified angina pectoris: Secondary | ICD-10-CM | POA: Diagnosis not present

## 2018-01-09 DIAGNOSIS — I10 Essential (primary) hypertension: Secondary | ICD-10-CM | POA: Insufficient documentation

## 2018-01-09 DIAGNOSIS — R55 Syncope and collapse: Secondary | ICD-10-CM | POA: Diagnosis not present

## 2018-01-09 DIAGNOSIS — R001 Bradycardia, unspecified: Secondary | ICD-10-CM | POA: Diagnosis not present

## 2018-01-09 DIAGNOSIS — R079 Chest pain, unspecified: Secondary | ICD-10-CM | POA: Diagnosis not present

## 2018-01-09 LAB — POCT I-STAT CREATININE: Creatinine, Ser: 0.6 mg/dL (ref 0.44–1.00)

## 2018-01-09 MED ORDER — NITROGLYCERIN 0.4 MG SL SUBL
0.8000 mg | SUBLINGUAL_TABLET | SUBLINGUAL | Status: DC | PRN
  Administered 2018-01-09: 0.8 mg via SUBLINGUAL
  Filled 2018-01-09: qty 25

## 2018-01-09 MED ORDER — NITROGLYCERIN 0.4 MG SL SUBL
SUBLINGUAL_TABLET | SUBLINGUAL | Status: AC
  Filled 2018-01-09: qty 2

## 2018-01-09 MED ORDER — IOPAMIDOL (ISOVUE-370) INJECTION 76%
INTRAVENOUS | Status: AC
  Filled 2018-01-09: qty 100

## 2018-01-09 MED ORDER — IOPAMIDOL (ISOVUE-370) INJECTION 76%
100.0000 mL | Freq: Once | INTRAVENOUS | Status: AC | PRN
  Administered 2018-01-09: 80 mL via INTRAVENOUS

## 2018-01-09 NOTE — Progress Notes (Signed)
CT completed. Tolerated well. D/C home walking awake and alert. In no distress.

## 2018-01-10 ENCOUNTER — Telehealth: Payer: Self-pay | Admitting: Cardiovascular Disease

## 2018-01-10 NOTE — Telephone Encounter (Signed)
She was found to have coronary plaque on CT imaging.  Although present, it was not felt to be significantly obstructive. Will need to pursue aggressive lipid therapy , which may induce plaque regression.  I can review the study in detail with the patient at her office visit.  If she is significantly worried can try to move her office visit up sooner

## 2018-01-10 NOTE — Telephone Encounter (Signed)
New Message: ° ° ° ° ° °Pt is returning a call for CT results. °

## 2018-01-10 NOTE — Telephone Encounter (Signed)
Returned call to pt she states that she wanted results for her CT from yesterday.   Notes recorded by Lendon Colonel, NP on 01/10/2018 at 8:05 AM EDT This is an intermediate risk test. Will talk to her about symptoms on follow up. May consider cardiac cath.  Pt states that she wants more detailed results than "intermediate risk" informed pt that is the message from Dr Purcell Nails and that she will get more detailed results at scheduled appointment. Pt would like message sent to Dr Claiborne Billings for more detailed results, informed pt that this is what the appt is for because it will take a longer explanation than MD can give on the phone. Pt verbalizes understanding and still would like Dr Kelly/Dr Purcell Nails to give her a more detailed result.. Will forward message to providers.

## 2018-01-13 NOTE — Telephone Encounter (Signed)
Agreed. Happy to see her sooner if Dr. Claiborne Billings does not have an opening. She may feel more comfortable discussing this with her cardiologist though. Whichever works best for the patient's comfort.

## 2018-01-14 NOTE — Telephone Encounter (Signed)
PT NOTIFIED SHE WILL AWAIT [facility] APPT

## 2018-01-30 ENCOUNTER — Encounter: Payer: Self-pay | Admitting: Internal Medicine

## 2018-01-30 ENCOUNTER — Ambulatory Visit (INDEPENDENT_AMBULATORY_CARE_PROVIDER_SITE_OTHER): Payer: Medicare Other | Admitting: Cardiovascular Disease

## 2018-01-30 ENCOUNTER — Encounter: Payer: Self-pay | Admitting: Cardiovascular Disease

## 2018-01-30 VITALS — BP 147/85 | HR 68 | Ht 60.5 in | Wt 141.8 lb

## 2018-01-30 DIAGNOSIS — R55 Syncope and collapse: Secondary | ICD-10-CM | POA: Diagnosis not present

## 2018-01-30 DIAGNOSIS — I25119 Atherosclerotic heart disease of native coronary artery with unspecified angina pectoris: Secondary | ICD-10-CM

## 2018-01-30 DIAGNOSIS — I10 Essential (primary) hypertension: Secondary | ICD-10-CM | POA: Diagnosis not present

## 2018-01-30 DIAGNOSIS — E785 Hyperlipidemia, unspecified: Secondary | ICD-10-CM

## 2018-01-30 NOTE — Patient Instructions (Signed)
Medication Instructions:  Your physician recommends that you continue on your current medications as directed. Please refer to the Current Medication list given to you today.  Follow-Up: As needed with Dr. Kelly    If you need a refill on your cardiac medications before your next appointment, please call your pharmacy.   

## 2018-01-30 NOTE — Progress Notes (Signed)
Cardiology Office Note    Date:  02/01/2018   ID:  Jennifer Davenport, DOB August 24, 1951, MRN [id number]  PCP:  Cassandria Anger, MD  Cardiologist:  Shelva Majestic, MD   Chief Complaint  Patient presents with  . Follow-up   Initial evaluation with me  History of Present Illness:  Jennifer Davenport is a 67 y.o. female presents for initial evaluation for me after being seen by Jory Sims, NP on December 06, 2017.  Ms. Jennifer Davenport has experienced 2 short  syncopal spells.  One occasion, this occurred several hours after giving blood after eating dinner returning dishes to the sink.  Her episode occurred undergoing eyelid surgery.  Each episode lasted for 2 to 3 minutes and she was unaware of any prodrome of tachycardia was unaware of any seizure activity or urinary incontinence.  One occasion, her hand was drawing  right side suggesting potential carpal-pedal spasm.  She has  experienced some heart palpitations with and without exertion. She was seen by Vena Rua, NP and was scheduled for an echo Doppler study as well as cardiac monitor.  Due to her significant family history for CABG revascularization surgery and her cardiac risk factors, a cardiac CT angios was recommended.  She was given a prescription for metoprolol 12.5 mg to take as needed for palpitations.  Her echo Doppler done on 12/20/2017 showed an EF of 55-60%.  There was mild LVH and mild MR.  Her cardiac CT revealed a calcium score at 46 echo is done units.  There was mild mixed plaque in the proximal LAD suggestive of mild stenosis.  There was mixed plaque in the proximal RCA with mild stenosis.  A one-month event monitor from March 28 through 01/18/2018 showed predominant sinus rhythm with an average rate at 73 bpm.  There are no episodes of atrial fibrillation.  Tachycardic episodes with 1% and bradycardic episodes 5%.  No significant ectopy was demonstrated.  There were no pauses.  Laboratory revealed a total cholesterol 244, LDL  cholesterol 146.  She had normal LFTs.  She presents for evaluation.  Past Medical History:  Diagnosis Date  . Abnormal Pap smear of cervix   . Allergy   . Atrophic vaginitis   . Bulging disc 10/2012   CERVICAL  . Bursitis of hip   . Endometriosis   . GERD (gastroesophageal reflux disease)   . Hyperlipidemia   . Hypertension   . IBS (irritable bowel syndrome)     Past Surgical History:  Procedure Laterality Date  . ABDOMINAL HYSTERECTOMY  11/1982   AUB & endometriosis, ovaries remain  . APPENDECTOMY  1/72  . BUNIONECTOMY Left 04/2004   left foot  . CERVIX LESION DESTRUCTION    . CRYOTHERAPY     for abnormal pap smear  . HYSTEROTOMY  5/72   fetal demise, also BTL  . LAPAROSCOPIC CHOLECYSTECTOMY  02/2005  . TMJ ARTHROPLASTY  6/94   secondary to hockey injury 15 years earlier  . TUBAL LIGATION Bilateral 5/72    Current Medications: Outpatient Medications Prior to Visit  Medication Sig Dispense Refill  . atorvastatin (LIPITOR) 80 MG tablet Take 1 tablet (80 mg total) by mouth daily at 6 PM. 90 tablet 3  . Azelaic Acid 15 % cream Apply topically 1 day or 1 dose. After skin is thoroughly washed and patted dry, gently but thoroughly massage a thin film of azelaic acid cream into the affected area twice daily, in the morning and evening.    [name]  b complex vitamins tablet Take 1 tablet by mouth daily.    . cetirizine (ZYRTEC) 10 MG tablet Take 1 tablet (10 mg total) by mouth daily. 100 tablet 3  . Cholecalciferol (VITAMIN D-3) 1000 UNITS CAPS Take by mouth daily.    [name] dicyclomine (BENTYL) 10 MG capsule TAKE ONE CAPSULE BY MOUTH EVERY 6 TO 8 HOURS AS NEEDED FOR CRAMPING AND SPASMS 90 capsule 0  . fluticasone (FLONASE) 50 MCG/ACT nasal spray Place 1-2 sprays into both nostrils daily.     [name] losartan (COZAAR) 50 MG tablet Take 1 tablet (50 mg total) daily by mouth. 90 tablet 3  . metoprolol tartrate (LOPRESSOR) 25 MG tablet Take 0.5 tablets (12.5 mg total) by mouth as needed (FOR  PALPITATIONS). 60 tablet 0  . NONFORMULARY OR COMPOUNDED ITEM estradiol 0.01% in HRT replacement cream.  Apply 54m vaginally twice weekly.  #30 grams 1 each 3  . Omega-3 Fatty Acids (FISH OIL) 1000 MG CAPS     . OVER THE COUNTER MEDICATION Take 800 mg by mouth 2 (two) times daily. [facility]   . Probiotic Product (PROBIOTIC PEARLS) CAPS Take 1 capsule by mouth daily.    .Marland KitchenUNABLE TO FIND Betaine hydrochloride     No facility-administered medications prior to visit.      Allergies:   Ceftriaxone sodium; Biaxin [clarithromycin]; Sulfonamide derivatives; Aspirin; Ciprofloxacin; Codeine; Dairy aid [lactase]; Gluten meal; Levaquin [levofloxacin in d5w]; Rocephin [ceftriaxone sodium in dextrose]; and Simvastatin   Social History   Socioeconomic History  . Marital status: Married    Spouse name: Not on file  . Number of children: Not on file  . Years of education: Not on file  . Highest education level: Not on file  Occupational History  . Not on file  Social Needs  . Financial resource strain: Not on file  . Food insecurity:    Worry: Not on file    Inability: Not on file  . Transportation needs:    Medical: Not on file    Non-medical: Not on file  Tobacco Use  . Smoking status: Never Smoker  . Smokeless tobacco: Never Used  Substance and Sexual Activity  . Alcohol use: No  . Drug use: No  . Sexual activity: Yes    Birth control/protection: Surgical    Comment: hysterectomy   Lifestyle  . Physical activity:    Days per week: Not on file    Minutes per session: Not on file  . Stress: Not on file  Relationships  . Social connections:    Talks on phone: Not on file    Gets together: Not on file    Attends religious service: Not on file    Active member of club or organization: Not on file    Attends meetings of clubs or organizations: Not on file    Relationship status: Not on file  Other Topics Concern  . Not on file  Social History Narrative  . Not on file      Family History:  The patient's family history includes Breast cancer (age of onset: 525 in her mother; CVA in her mother; Diverticulitis in her mother; Heart disease in her brother, maternal grandfather, maternal uncle, mother, and sister; Hypercholesterolemia in her mother; Hypertension in her brother, father, maternal grandfather, maternal uncle, mother, and sister; Lung cancer (age of onset: 635 in her brother; Osteoarthritis in her mother and sister.   ROS General: Negative; No fevers, chills, or night sweats;  HEENT: Negative; No  changes in vision or hearing, sinus congestion, difficulty swallowing Pulmonary: Negative; No cough, wheezing, shortness of breath, hemoptysis Cardiovascular: See HPI GI: Negative; No nausea, vomiting, diarrhea, or abdominal pain GU: Negative; No dysuria, hematuria, or difficulty voiding Musculoskeletal: Negative; no myalgias, joint pain, or weakness Hematologic/Oncology: Negative; no easy bruising, bleeding Endocrine: Negative; no heat/cold intolerance; no diabetes Neuro: Negative; no changes in balance, headaches Skin: Negative; No rashes or skin lesions Psychiatric: Negative; No behavioral problems, depression Sleep: Negative; No snoring, daytime sleepiness, hypersomnolence, bruxism, restless legs, hypnogognic hallucinations, no cataplexy Other comprehensive 14 point system review is negative.   PHYSICAL EXAM:   VS:  BP (!) 147/85   Pulse 68   Ht 5' 0.5" (1.537 m)   Wt 141 lb 12.8 oz (64.3 kg)   LMP 11/24/1982   BMI 27.24 kg/m     Repeat blood pressure by me was 126/80 supine and 124/78 standing.  Wt Readings from Last 3 Encounters:  01/30/18 141 lb 12.8 oz (64.3 kg)  12/06/17 141 lb 9.6 oz (64.2 kg)  11/21/17 143 lb 1.9 oz (64.9 kg)    General: Alert, oriented, no distress.  Skin: normal turgor, no rashes, warm and dry HEENT: Normocephalic, atraumatic. Pupils equal round and reactive to light; sclera anicteric; extraocular muscles intact;  Fundi vessels normal, disc flat.  No hemorrhages or exudates. Nose without nasal septal hypertrophy Mouth/Parynx benign; Mallinpatti scale 3 Neck: No JVD, no carotid bruits; normal carotid upstroke Lungs: clear to ausculatation and percussion; no wheezing or rales Chest wall: without tenderness to palpitation Heart: PMI not displaced, RRR, s1 s2 normal, 1/6 systolic murmur, no diastolic murmur, no rubs, gallops, thrills, or heaves Abdomen: soft, nontender; no hepatosplenomehaly, BS+; abdominal aorta nontender and not dilated by palpation. Back: no CVA tenderness Pulses 2+ Musculoskeletal: full range of motion, normal strength, no joint deformities Extremities: no clubbing cyanosis or edema, Homan's sign negative  Neurologic: grossly nonfocal; Cranial nerves grossly wnl Psychologic: Normal mood and affect   Studies/Labs Reviewed:   EKG:  EKG is  ordered today.  ECG (independently read by me): Sinus rhythm at 68 bpm.  Nonspecific ST-T changes.  Normal intervals.  No ectopy.  Recent Labs: BMP Latest Ref Rng & Units 01/09/2018 11/21/2017 07/19/2017  Glucose 70 - 99 mg/dL - 82 89  BUN 6 - 23 mg/dL - 7 11  Creatinine 0.44 - 1.00 mg/dL 0.60 0.64 0.67  Sodium 135 - 145 mEq/L - 136 134(L)  Potassium 3.5 - 5.1 mEq/L - 4.1 3.5  Chloride 96 - 112 mEq/L - 103 103  CO2 19 - 32 mEq/L - 28 24  Calcium 8.4 - 10.5 mg/dL - 9.3 9.4     Hepatic Function Latest Ref Rng & Units 12/07/2017 11/21/2017 07/19/2017  Total Protein 6.0 - 8.5 g/dL 6.5 6.3 6.9  Albumin 3.6 - 4.8 g/dL 4.1 3.7 4.0  AST 0 - 40 IU/L 16 15 22   ALT 0 - 32 IU/L 16 14 19   Alk Phosphatase 39 - 117 IU/L 55 41 43  Total Bilirubin 0.0 - 1.2 mg/dL 0.4 0.4 0.6  Bilirubin, Direct 0.00 - 0.40 mg/dL 0.09 - -    CBC Latest Ref Rng & Units 11/21/2017 07/19/2017 02/20/2017  WBC 4.0 - 10.5 K/uL 7.8 6.0 6.3  Hemoglobin 12.0 - 15.0 g/dL 13.3 12.5 13.1  Hematocrit 36.0 - 46.0 % 40.1 38.1 39.3  Platelets 150.0 - 400.0 K/uL 298.0 249 267.0   Lab  Results  Component Value Date   MCV 86.5 11/21/2017  MCV 86.2 07/19/2017   MCV 86.7 02/20/2017   Lab Results  Component Value Date   TSH 1.94 11/21/2017   Lab Results  Component Value Date   HGBA1C 6.0 02/20/2017     BNP No results found for: BNP  ProBNP No results found for: PROBNP   Lipid Panel     Component Value Date/Time   CHOL 244 (H) 12/07/2017 0815   TRIG 92 12/07/2017 0815   HDL 80 12/07/2017 0815   CHOLHDL 3.1 12/07/2017 0815   CHOLHDL 3 02/20/2017 0858   VLDL 28.4 02/20/2017 0858   LDLCALC 146 (H) 12/07/2017 0815     RADIOLOGY: Ct Coronary Morph W/cta Cor W/score W/ca W/cm &/or Wo/cm  Addendum Date: 01/10/2018   ADDENDUM REPORT: 01/10/2018 07:31 CLINICAL DATA:  Chest pain EXAM: Cardiac CTA MEDICATIONS: Sub lingual nitro. '4mg'$  x 2 and lopressor '5mg'$  IV TECHNIQUE: The patient was scanned on a Siemens 347 slice scanner. Gantry rotation speed was 250 msecs. Collimation was 0.6 mm. A 100 kV prospective scan was triggered in the ascending thoracic aorta at 35-75% of the R-R interval. Average HR during the scan was 60 bpm. The 3D data set was interpreted on a dedicated work station using MPR, MIP and VRT modes. A total of 80cc of contrast was used. FINDINGS: Non-cardiac: See separate report from [facility]. Calcium Score: 46 Agatston units. Coronary Arteries: Right dominant with no anomalies LM: No plaque or stenosis. LAD system: Mixed plaque in proximal LAD at take-off of small D1. Mild stenosis. Cannot rule out moderate ostial D1 stenosis but small vessel. Circumflex system: No plaque or stenosis. RCA system: Mixed plaque in the proximal RCA with mild stenosis. IMPRESSION: 1. Coronary artery calcium score 46 Agatston units. This places the patient in the 73rd percentile for age and gender, suggesting intermediate risk for future cardiac events. 2.  No significantly obstructive coronary artery disease. Dalton Mclean Electronically Signed   By: Loralie Champagne M.D.    On: 01/10/2018 07:31   Result Date: 01/10/2018 EXAM: OVER-READ INTERPRETATION  CT CHEST The following report is an over-read performed by radiologist Dr. Rolm Baptise of [facility], [place] on 01/09/2018. This over-read does not include interpretation of cardiac or coronary anatomy or pathology. The coronary CTA interpretation by the cardiologist is attached. COMPARISON:  None. FINDINGS: Vascular: Heart is normal size.  Visualized aorta is normal caliber. Mediastinum/Nodes: No adenopathy in the lower mediastinum or hila. Lungs/Pleura: Visualized lungs clear.  No effusions. Upper Abdomen: Imaging into the upper abdomen shows no acute findings. Musculoskeletal: Chest wall soft tissues are unremarkable. No acute bony abnormality. IMPRESSION: No acute or significant extracardiac abnormality. Electronically Signed: By: Rolm Baptise M.D. On: 01/09/2018 09:26     Additional studies/ records that were reviewed today include:  I reviewed the office note of catheter enlarged.  I reviewed her echo Doppler study, CT, event monitor and laboratory.    ASSESSMENT:    1. Syncope, unspecified syncope type   2. Coronary artery disease involving native heart with angina pectoris, unspecified vessel or lesion type ([place])   3. Hypertension, unspecified type   4. Hyperlipidemia with target LDL less than 70     PLAN:  Ms Klinke is a pleasant 67 year old female who experienced 2 short-lived episodes of transient syncope.  These are unassociated with any prodrome of tachycardia and on one instance occurred later in the day after she had given blood.  following dinner and after another episode of eyelid surgery.  She has been documented  to have normal systolic function on echo evaluation with mild LVH and only mild MR.  Her cardiac CT shows evidence for mild coronary plaque without significant stenosis.  Calcium score was 46.  Her event monitor did not reveal any significant arrhythmia.  There are no episodes of atrial  fibrillation episodes of prolonged tachycardia or bradycardia arrhythmia.  Her lipid study suggests hyperlipidemia with LDL at this 146.  She had been on simvastatin.  Remotely, but had not been on treatment for a year.  I have suggested lipid-lowering therapy with a target LDL less than 70 particularly with her demonstration of subclinical atherosclerosis.  She had recently been started on atorvastatin 80 mg.  I long discussion with her in the office today and reviewed.  Data regarding potential for coronary regression of LDL is less than 70 and preferably in the 50s or below.  This possible that her syncopal spells may not have been of cardiac etiology.  On one occasion.  Her symptoms were associated with carpal pedal spasm age and the possibility of transient electrolyte abnormality.  I discussed that the 30 day event monitor may not have picked up any arrhythmia, but that it certainly possible that there could have been an arrhythmogenic etiology.  We discussed if she has recurrent episodes of syncope, a loop recorder may potentially be of value for more comprehensive assessment.  She did not appear to have any seizure activity.  There is no orthostatic hypotension on exam today.  Her blood pressure today is stable on losartan 50 mg .  .  She has a prescription for metoprolol 12.5 mg but has not taken.  She is tolerating atorvastatin for hyperlipidemia.  As long as she is stable, I will see her one year for reevaluation.  Medication Adjustments/Labs and Tests Ordered: Current medicines are reviewed at length with the patient today.  Concerns regarding medicines are outlined above.  Medication changes, Labs and Tests ordered today are listed in the Patient Instructions below. Patient Instructions  Medication Instructions:  Your physician recommends that you continue on your current medications as directed. Please refer to the Current Medication list given to you today.  Follow-Up: As needed with Dr.  Claiborne Billings     If you need a refill on your cardiac medications before your next appointment, please call your pharmacy.      Signed, Shelva Majestic, MD  02/01/2018 10:38 PM    [place] [address], [place], [place], [place]  [address] Phone: [phone]

## 2018-01-31 ENCOUNTER — Other Ambulatory Visit: Payer: Self-pay | Admitting: Internal Medicine

## 2018-01-31 DIAGNOSIS — R55 Syncope and collapse: Secondary | ICD-10-CM

## 2018-01-31 DIAGNOSIS — L821 Other seborrheic keratosis: Secondary | ICD-10-CM | POA: Diagnosis not present

## 2018-01-31 DIAGNOSIS — L237 Allergic contact dermatitis due to plants, except food: Secondary | ICD-10-CM | POA: Diagnosis not present

## 2018-01-31 DIAGNOSIS — D1801 Hemangioma of skin and subcutaneous tissue: Secondary | ICD-10-CM | POA: Diagnosis not present

## 2018-01-31 DIAGNOSIS — L814 Other melanin hyperpigmentation: Secondary | ICD-10-CM | POA: Diagnosis not present

## 2018-01-31 DIAGNOSIS — L820 Inflamed seborrheic keratosis: Secondary | ICD-10-CM | POA: Diagnosis not present

## 2018-01-31 DIAGNOSIS — L309 Dermatitis, unspecified: Secondary | ICD-10-CM | POA: Diagnosis not present

## 2018-02-01 ENCOUNTER — Encounter: Payer: Self-pay | Admitting: Cardiovascular Disease

## 2018-02-15 ENCOUNTER — Encounter: Payer: Self-pay | Admitting: Gastroenterology

## 2018-02-20 ENCOUNTER — Encounter: Payer: Self-pay | Admitting: Gastroenterology

## 2018-02-22 ENCOUNTER — Encounter: Payer: BLUE CROSS/BLUE SHIELD | Admitting: Internal Medicine

## 2018-03-04 ENCOUNTER — Other Ambulatory Visit (INDEPENDENT_AMBULATORY_CARE_PROVIDER_SITE_OTHER): Payer: Medicare Other

## 2018-03-04 ENCOUNTER — Encounter: Payer: Self-pay | Admitting: Internal Medicine

## 2018-03-04 ENCOUNTER — Ambulatory Visit (INDEPENDENT_AMBULATORY_CARE_PROVIDER_SITE_OTHER): Payer: Medicare Other | Admitting: Internal Medicine

## 2018-03-04 VITALS — BP 132/78 | HR 67 | Temp 98.3°F | Ht 60.05 in | Wt 142.0 lb

## 2018-03-04 DIAGNOSIS — E785 Hyperlipidemia, unspecified: Secondary | ICD-10-CM

## 2018-03-04 DIAGNOSIS — K580 Irritable bowel syndrome with diarrhea: Secondary | ICD-10-CM

## 2018-03-04 DIAGNOSIS — K9041 Non-celiac gluten sensitivity: Secondary | ICD-10-CM

## 2018-03-04 DIAGNOSIS — Z0000 Encounter for general adult medical examination without abnormal findings: Secondary | ICD-10-CM

## 2018-03-04 DIAGNOSIS — I25119 Atherosclerotic heart disease of native coronary artery with unspecified angina pectoris: Secondary | ICD-10-CM

## 2018-03-04 DIAGNOSIS — I10 Essential (primary) hypertension: Secondary | ICD-10-CM

## 2018-03-04 LAB — URINALYSIS
Bilirubin Urine: NEGATIVE
Hgb urine dipstick: NEGATIVE
Ketones, ur: NEGATIVE
Leukocytes, UA: NEGATIVE
Nitrite: NEGATIVE
Specific Gravity, Urine: 1.01 (ref 1.000–1.030)
Total Protein, Urine: NEGATIVE
Urine Glucose: NEGATIVE
Urobilinogen, UA: 0.2 (ref 0.0–1.0)
pH: 8 (ref 5.0–8.0)

## 2018-03-04 LAB — BASIC METABOLIC PANEL
BUN: 11 mg/dL (ref 6–23)
CO2: 27 mEq/L (ref 19–32)
Calcium: 9.4 mg/dL (ref 8.4–10.5)
Chloride: 105 mEq/L (ref 96–112)
Creatinine, Ser: 0.58 mg/dL (ref 0.40–1.20)
GFR: 110.64 mL/min (ref >60.00)
Glucose, Bld: 84 mg/dL (ref 70–99)
Potassium: 4.1 mEq/L (ref 3.5–5.1)
Sodium: 139 mEq/L (ref 135–145)

## 2018-03-04 LAB — LIPID PANEL
Cholesterol: 151 mg/dL (ref 0–200)
HDL: 72.5 mg/dL (ref >39.00)
LDL Cholesterol: 66 mg/dL (ref 0–99)
NonHDL: 78.62
Total CHOL/HDL Ratio: 2
Triglycerides: 65 mg/dL (ref 0.0–149.0)
VLDL: 13 mg/dL (ref 0.0–40.0)

## 2018-03-04 LAB — HEPATIC FUNCTION PANEL
ALT: 45 U/L — ABNORMAL HIGH (ref 0–35)
AST: 30 U/L (ref 0–37)
Albumin: 4.2 g/dL (ref 3.5–5.2)
Alkaline Phosphatase: 62 U/L (ref 39–117)
Bilirubin, Direct: 0.1 mg/dL (ref 0.0–0.3)
Total Bilirubin: 0.4 mg/dL (ref 0.2–1.2)
Total Protein: 6.9 g/dL (ref 6.0–8.3)

## 2018-03-04 MED ORDER — DICYCLOMINE HCL 10 MG PO CAPS: ORAL_CAPSULE | ORAL | 1 refills | Status: DC

## 2018-03-04 MED ORDER — LOSARTAN POTASSIUM 50 MG PO TABS: 50.0000 mg | ORAL_TABLET | Freq: Every day | ORAL | 3 refills | Status: DC

## 2018-03-04 MED ORDER — ZOSTER VAC RECOMB ADJUVANTED 50 MCG/0.5ML IM SUSR: 0.5000 mL | Freq: Once | INTRAMUSCULAR | 1 refills | Status: AC

## 2018-03-04 NOTE — Patient Instructions (Signed)
Health Maintenance for Postmenopausal Women Menopause is a normal process in which your reproductive ability comes to an end. This process happens gradually over a span of months to years, usually between the ages of 22 and 9. Menopause is complete when you have missed 12 consecutive menstrual periods. It is important to talk with your health care provider about some of the most common conditions that affect postmenopausal women, such as heart disease, cancer, and bone loss (osteoporosis). Adopting a healthy lifestyle and getting preventive care can help to promote your health and wellness. Those actions can also lower your chances of developing some of these common conditions. What should I know about menopause? During menopause, you may experience a number of symptoms, such as:  Moderate-to-severe hot flashes.  Night sweats.  Decrease in sex drive.  Mood swings.  Headaches.  Tiredness.  Irritability.  Memory problems.  Insomnia.  Choosing to treat or not to treat menopausal changes is an individual decision that you make with your health care provider. What should I know about hormone replacement therapy and supplements? Hormone therapy products are effective for treating symptoms that are associated with menopause, such as hot flashes and night sweats. Hormone replacement carries certain risks, especially as you become older. If you are thinking about using estrogen or estrogen with progestin treatments, discuss the benefits and risks with your health care provider. What should I know about heart disease and stroke? Heart disease, heart attack, and stroke become more likely as you age. This may be due, in part, to the hormonal changes that your body experiences during menopause. These can affect how your body processes dietary fats, triglycerides, and cholesterol. Heart attack and stroke are both medical emergencies. There are many things that you can do to help prevent heart disease  and stroke:  Have your blood pressure checked at least every 1-2 years. High blood pressure causes heart disease and increases the risk of stroke.  If you are 53-22 years old, ask your health care provider if you should take aspirin to prevent a heart attack or a stroke.  Do not use any tobacco products, including cigarettes, chewing tobacco, or electronic cigarettes. If you need help quitting, ask your health care provider.  It is important to eat a healthy diet and maintain a healthy weight. ? Be sure to include plenty of vegetables, fruits, low-fat dairy products, and lean protein. ? Avoid eating foods that are high in solid fats, added sugars, or salt (sodium).  Get regular exercise. This is one of the most important things that you can do for your health. ? Try to exercise for at least 150 minutes each week. The type of exercise that you do should increase your heart rate and make you sweat. This is known as moderate-intensity exercise. ? Try to do strengthening exercises at least twice each week. Do these in addition to the moderate-intensity exercise.  Know your numbers.Ask your health care provider to check your cholesterol and your blood glucose. Continue to have your blood tested as directed by your health care provider.  What should I know about cancer screening? There are several types of cancer. Take the following steps to reduce your risk and to catch any cancer development as early as possible. Breast Cancer  Practice breast self-awareness. ? This means understanding how your breasts normally appear and feel. ? It also means doing regular breast self-exams. Let your health care provider know about any changes, no matter how small.  If you are 40  or older, have a clinician do a breast exam (clinical breast exam or CBE) every year. Depending on your age, family history, and medical history, it may be recommended that you also have a yearly breast X-ray (mammogram).  If you  have a family history of breast cancer, talk with your health care provider about genetic screening.  If you are at high risk for breast cancer, talk with your health care provider about having an MRI and a mammogram every year.  Breast cancer (BRCA) gene test is recommended for women who have family members with BRCA-related cancers. Results of the assessment will determine the need for genetic counseling and BRCA1 and for BRCA2 testing. BRCA-related cancers include these types: ? Breast. This occurs in males or females. ? Ovarian. ? Tubal. This may also be called fallopian tube cancer. ? Cancer of the abdominal or pelvic lining (peritoneal cancer). ? Prostate. ? Pancreatic.  Cervical, Uterine, and Ovarian Cancer Your health care provider may recommend that you be screened regularly for cancer of the pelvic organs. These include your ovaries, uterus, and vagina. This screening involves a pelvic exam, which includes checking for microscopic changes to the surface of your cervix (Pap test).  For women ages 21-65, health care providers may recommend a pelvic exam and a Pap test every three years. For women ages 79-65, they may recommend the Pap test and pelvic exam, combined with testing for human papilloma virus (HPV), every five years. Some types of HPV increase your risk of cervical cancer. Testing for HPV may also be done on women of any age who have unclear Pap test results.  Other health care providers may not recommend any screening for nonpregnant women who are considered low risk for pelvic cancer and have no symptoms. Ask your health care provider if a screening pelvic exam is right for you.  If you have had past treatment for cervical cancer or a condition that could lead to cancer, you need Pap tests and screening for cancer for at least 20 years after your treatment. If Pap tests have been discontinued for you, your risk factors (such as having a new sexual partner) need to be  reassessed to determine if you should start having screenings again. Some women have medical problems that increase the chance of getting cervical cancer. In these cases, your health care provider may recommend that you have screening and Pap tests more often.  If you have a family history of uterine cancer or ovarian cancer, talk with your health care provider about genetic screening.  If you have vaginal bleeding after reaching menopause, tell your health care provider.  There are currently no reliable tests available to screen for ovarian cancer.  Lung Cancer Lung cancer screening is recommended for adults 69-62 years old who are at high risk for lung cancer because of a history of smoking. A yearly low-dose CT scan of the lungs is recommended if you:  Currently smoke.  Have a history of at least 30 pack-years of smoking and you currently smoke or have quit within the past 15 years. A pack-year is smoking an average of one pack of cigarettes per day for one year.  Yearly screening should:  Continue until it has been 15 years since you quit.  Stop if you develop a health problem that would prevent you from having lung cancer treatment.  Colorectal Cancer  This type of cancer can be detected and can often be prevented.  Routine colorectal cancer screening usually begins at  age 42 and continues through age 45.  If you have risk factors for colon cancer, your health care provider may recommend that you be screened at an earlier age.  If you have a family history of colorectal cancer, talk with your health care provider about genetic screening.  Your health care provider may also recommend using home test kits to check for hidden blood in your stool.  A small camera at the end of a tube can be used to examine your colon directly (sigmoidoscopy or colonoscopy). This is done to check for the earliest forms of colorectal cancer.  Direct examination of the colon should be repeated every  5-10 years until age 71. However, if early forms of precancerous polyps or small growths are found or if you have a family history or genetic risk for colorectal cancer, you may need to be screened more often.  Skin Cancer  Check your skin from head to toe regularly.  Monitor any moles. Be sure to tell your health care provider: ? About any new moles or changes in moles, especially if there is a change in a mole's shape or color. ? If you have a mole that is larger than the size of a pencil eraser.  If any of your family members has a history of skin cancer, especially at a young age, talk with your health care provider about genetic screening.  Always use sunscreen. Apply sunscreen liberally and repeatedly throughout the day.  Whenever you are outside, protect yourself by wearing long sleeves, pants, a wide-brimmed hat, and sunglasses.  What should I know about osteoporosis? Osteoporosis is a condition in which bone destruction happens more quickly than new bone creation. After menopause, you may be at an increased risk for osteoporosis. To help prevent osteoporosis or the bone fractures that can happen because of osteoporosis, the following is recommended:  If you are 46-71 years old, get at least 1,000 mg of calcium and at least 600 mg of vitamin D per day.  If you are older than age 55 but younger than age 65, get at least 1,200 mg of calcium and at least 600 mg of vitamin D per day.  If you are older than age 54, get at least 1,200 mg of calcium and at least 800 mg of vitamin D per day.  Smoking and excessive alcohol intake increase the risk of osteoporosis. Eat foods that are rich in calcium and vitamin D, and do weight-bearing exercises several times each week as directed by your health care provider. What should I know about how menopause affects my mental health? Depression may occur at any age, but it is more common as you become older. Common symptoms of depression  include:  Low or sad mood.  Changes in sleep patterns.  Changes in appetite or eating patterns.  Feeling an overall lack of motivation or enjoyment of activities that you previously enjoyed.  Frequent crying spells.  Talk with your health care provider if you think that you are experiencing depression. What should I know about immunizations? It is important that you get and maintain your immunizations. These include:  Tetanus, diphtheria, and pertussis (Tdap) booster vaccine.  Influenza every year before the flu season begins.  Pneumonia vaccine.  Shingles vaccine.  Your health care provider may also recommend other immunizations. This information is not intended to replace advice given to you by your health care provider. Make sure you discuss any questions you have with your health care provider. Document Released: 11/03/2005  Document Revised: 03/31/2016 Document Reviewed: 06/15/2015 Elsevier Interactive Patient Education  2018 [employer]  

## 2018-03-04 NOTE — Progress Notes (Signed)
Subjective:  Patient ID: Jennifer Davenport, female    DOB: August 29, 1951  Age: 67 y.o. MRN: [id number]  CC: No chief complaint on file.   HPI HEIDE BROSSART presents for a well exam  Outpatient Medications Prior to Visit  Medication Sig Dispense Refill  . atorvastatin (LIPITOR) 80 MG tablet Take 1 tablet (80 mg total) by mouth daily at 6 PM. 90 tablet 3  . Azelaic Acid 15 % cream Apply topically 1 day or 1 dose. After skin is thoroughly washed and patted dry, gently but thoroughly massage a thin film of azelaic acid cream into the affected area twice daily, in the morning and evening.    [name] b complex vitamins tablet Take 1 tablet by mouth daily.    . cetirizine (ZYRTEC) 10 MG tablet Take 1 tablet (10 mg total) by mouth daily. 100 tablet 3  . Cholecalciferol (VITAMIN D-3) 1000 UNITS CAPS Take by mouth daily.    [name] dicyclomine (BENTYL) 10 MG capsule TAKE ONE CAPSULE BY MOUTH EVERY 6 TO 8 HOURS AS NEEDED FOR CRAMPING AND SPASMS 90 capsule 0  . fluticasone (FLONASE) 50 MCG/ACT nasal spray Place 1-2 sprays into both nostrils daily.     [name] losartan (COZAAR) 50 MG tablet Take 1 tablet (50 mg total) daily by mouth. 90 tablet 3  . metoprolol tartrate (LOPRESSOR) 25 MG tablet Take 0.5 tablets (12.5 mg total) by mouth as needed (FOR PALPITATIONS). 60 tablet 0  . NONFORMULARY OR COMPOUNDED ITEM estradiol 0.01% in HRT replacement cream.  Apply 33ml vaginally twice weekly.  #30 grams 1 each 3  . Omega-3 Fatty Acids (FISH OIL) 1000 MG CAPS     . OVER THE COUNTER MEDICATION Take 800 mg by mouth 2 (two) times daily. [facility]    . Probiotic Product (PROBIOTIC PEARLS) CAPS Take 1 capsule by mouth daily.    [name] UNABLE TO FIND Betaine hydrochloride     No facility-administered medications prior to visit.     ROS: Review of Systems  Constitutional: Negative for activity change, appetite change, chills, fatigue and unexpected weight change.  HENT: Negative for congestion, mouth sores and sinus pressure.     Eyes: Negative for visual disturbance.  Respiratory: Negative for cough and chest tightness.   Gastrointestinal: Negative for abdominal pain and nausea.  Genitourinary: Negative for difficulty urinating, frequency and vaginal pain.  Musculoskeletal: Negative for back pain and gait problem.  Skin: Negative for pallor and rash.  Neurological: Negative for dizziness, tremors, weakness, numbness and headaches.  Psychiatric/Behavioral: Negative for confusion, dysphoric mood, sleep disturbance and suicidal ideas.    Objective:  BP 132/78 (BP Location: Left Arm, Patient Position: Sitting, Cuff Size: Normal)   Pulse 67   Temp 98.3 F (36.8 C) (Oral)   Ht 5' 0.05" (1.525 m)   Wt 142 lb (64.4 kg)   LMP 11/24/1982   SpO2 98%   BMI 27.69 kg/m   BP Readings from Last 3 Encounters:  03/04/18 132/78  01/30/18 (!) 147/85  01/09/18 (!) 145/75    Wt Readings from Last 3 Encounters:  03/04/18 142 lb (64.4 kg)  01/30/18 141 lb 12.8 oz (64.3 kg)  12/06/17 141 lb 9.6 oz (64.2 kg)    Physical Exam  Constitutional: She appears well-developed. No distress.  HENT:  Head: Normocephalic.  Right Ear: External ear normal.  Left Ear: External ear normal.  Nose: Nose normal.  Mouth/Throat: Oropharynx is clear and moist.  Eyes: Pupils are equal, round, and reactive to light.  Conjunctivae are normal. Right eye exhibits no discharge. Left eye exhibits no discharge.  Neck: Normal range of motion. Neck supple. No JVD present. No tracheal deviation present. No thyromegaly present.  Cardiovascular: Normal rate, regular rhythm and normal heart sounds.  Pulmonary/Chest: No stridor. No respiratory distress. She has no wheezes.  Abdominal: Soft. Bowel sounds are normal. She exhibits no distension and no mass. There is no tenderness. There is no rebound and no guarding.  Musculoskeletal: She exhibits no edema or tenderness.  Lymphadenopathy:    She has no cervical adenopathy.  Neurological: She displays  normal reflexes. No cranial nerve deficit. She exhibits normal muscle tone. Coordination normal.  Skin: No rash noted. No erythema.  Psychiatric: She has a normal mood and affect. Her behavior is normal. Judgment and thought content normal.    Lab Results  Component Value Date   WBC 7.8 11/21/2017   HGB 13.3 11/21/2017   HCT 40.1 11/21/2017   PLT 298.0 11/21/2017   GLUCOSE 84 03/04/2018   CHOL 151 03/04/2018   TRIG 65.0 03/04/2018   HDL 72.50 03/04/2018   LDLCALC 66 03/04/2018   ALT 45 (H) 03/04/2018   AST 30 03/04/2018   NA 139 03/04/2018   K 4.1 03/04/2018   CL 105 03/04/2018   CREATININE 0.58 03/04/2018   BUN 11 03/04/2018   CO2 27 03/04/2018   TSH 1.94 11/21/2017   HGBA1C 6.0 02/20/2017    Ct Coronary Morph W/cta Cor W/score W/ca W/cm &/or Wo/cm  Addendum Date: 01/10/2018   ADDENDUM REPORT: 01/10/2018 07:31 CLINICAL DATA:  Chest pain EXAM: Cardiac CTA MEDICATIONS: Sub lingual nitro. 4mg  x 2 and lopressor 5mg  IV TECHNIQUE: The patient was scanned on a Siemens 253 slice scanner. Gantry rotation speed was 250 msecs. Collimation was 0.6 mm. A 100 kV prospective scan was triggered in the ascending thoracic aorta at 35-75% of the R-R interval. Average HR during the scan was 60 bpm. The 3D data set was interpreted on a dedicated work station using MPR, MIP and VRT modes. A total of 80cc of contrast was used. FINDINGS: Non-cardiac: See separate report from [facility]. Calcium Score: 46 Agatston units. Coronary Arteries: Right dominant with no anomalies LM: No plaque or stenosis. LAD system: Mixed plaque in proximal LAD at take-off of small D1. Mild stenosis. Cannot rule out moderate ostial D1 stenosis but small vessel. Circumflex system: No plaque or stenosis. RCA system: Mixed plaque in the proximal RCA with mild stenosis. IMPRESSION: 1. Coronary artery calcium score 46 Agatston units. This places the patient in the 73rd percentile for age and gender, suggesting intermediate risk  for future cardiac events. 2.  No significantly obstructive coronary artery disease. Dalton Mclean Electronically Signed   By: Loralie Champagne M.D.   On: 01/10/2018 07:31   Result Date: 01/10/2018 EXAM: OVER-READ INTERPRETATION  CT CHEST The following report is an over-read performed by radiologist Dr. Rolm Baptise of [facility], [place] on 01/09/2018. This over-read does not include interpretation of cardiac or coronary anatomy or pathology. The coronary CTA interpretation by the cardiologist is attached. COMPARISON:  None. FINDINGS: Vascular: Heart is normal size.  Visualized aorta is normal caliber. Mediastinum/Nodes: No adenopathy in the lower mediastinum or hila. Lungs/Pleura: Visualized lungs clear.  No effusions. Upper Abdomen: Imaging into the upper abdomen shows no acute findings. Musculoskeletal: Chest wall soft tissues are unremarkable. No acute bony abnormality. IMPRESSION: No acute or significant extracardiac abnormality. Electronically Signed: By: Rolm Baptise M.D. On: 01/09/2018 09:26  Assessment & Plan:   There are no diagnoses linked to this encounter.   No orders of the defined types were placed in this encounter.    Follow-up: No follow-ups on file.  Walker Kehr, MD

## 2018-03-04 NOTE — Assessment & Plan Note (Signed)
Losartan 

## 2018-03-04 NOTE — Assessment & Plan Note (Signed)
Bentyl prn

## 2018-03-04 NOTE — Assessment & Plan Note (Signed)

## 2018-03-04 NOTE — Assessment & Plan Note (Signed)
Dr Kelly Lipitor 

## 2018-03-06 DIAGNOSIS — L820 Inflamed seborrheic keratosis: Secondary | ICD-10-CM | POA: Diagnosis not present

## 2018-03-06 DIAGNOSIS — L920 Granuloma annulare: Secondary | ICD-10-CM | POA: Diagnosis not present

## 2018-04-03 ENCOUNTER — Encounter

## 2018-04-03 ENCOUNTER — Ambulatory Visit (INDEPENDENT_AMBULATORY_CARE_PROVIDER_SITE_OTHER): Payer: Medicare Other | Admitting: Diagnostic Neuroimaging

## 2018-04-03 ENCOUNTER — Encounter: Payer: Self-pay | Admitting: Diagnostic Neuroimaging

## 2018-04-03 VITALS — BP 110/90 | HR 68 | Ht 60.5 in | Wt 143.4 lb

## 2018-04-03 DIAGNOSIS — R55 Syncope and collapse: Secondary | ICD-10-CM | POA: Diagnosis not present

## 2018-04-03 DIAGNOSIS — I25119 Atherosclerotic heart disease of native coronary artery with unspecified angina pectoris: Secondary | ICD-10-CM

## 2018-04-03 NOTE — Patient Instructions (Signed)
-   monitor symptoms  - if symptoms recur, then may consider MRI brain and EEG  - stay hydrated

## 2018-04-03 NOTE — Progress Notes (Signed)
[facility]  PATIENT: Jennifer Davenport DOB: [birth year]  REFERRING CLINICIAN: A Plotnikov HISTORY FROM: patient and husband and chart REASON FOR VISIT: new consult    HISTORICAL  CHIEF COMPLAINT:  Chief Complaint  Patient presents with  . NP Dr. Alain Marion  . Near Syncope    2 episodes.  06/2017 tingling arms legs, hand drawing,clammy  10/2017 clammy, tingling arms legs, passed out (cardiac w/u negative)    HISTORY OF PRESENT ILLNESS:   67 year old female with history of hypertension, hyperlipidemia, irritable bowel syndrome, here for evaluation of near syncope and syncope attacks.  07/19/2017 patient felt discomfort in her left upper quadrant abdominal region, and went to use the bathroom.  Then patient felt hot sensation, sweating, electrical shock sensations, hands drying up and clenched sensation, shortness of breath.  She was able to communicate to her husband that something was wrong.  Husband called 78 and patient was taken to emergency room.  Lab testing, x-ray, were unremarkable.  Patient was diagnosed with possible presyncope triggered by abdominal pain spasm versus anxiety or panic attack.  11/19/2017 patient donated blood plasma.  Later that evening patient felt clammy sensation, hot and sweaty sensation, tunnel vision, numbness and tingling in arms, feet and legs bilaterally.  Patient then passed out briefly.  No tongue biting or incontinence.  Patient woke up within less than 1 minute.  She felt tired and drained of energy.  Blood pressure readings at home were 90s over 60s.  Patient was seen in the office 2 days later.  Patient followed up in cardiology clinic, and heart monitor and cardiac testing were unremarkable.  Due to negative cardiac work-up, PCP referred patient here for consideration of other neurologic etiologies of these episodes.  Patient has never had similar episodes in the past.  No history of seizure.  No history of stroke.  No family  history of seizure.  No recurrence of symptoms since February 2019.  No unilateral numbness, weakness, slurred speech or trouble talking.  No unilateral vision loss.  No double vision.   REVIEW OF SYSTEMS: Full 14 system review of systems performed and negative with exception of: Headache passing out aching muscles ringing in ears palpitations.  ALLERGIES: Allergies  Allergen Reactions  . Ceftriaxone Sodium     Anaphylaxis shock Can take Amoxicillin ok  . Biaxin [Clarithromycin]     Chest pain  . Sulfonamide Derivatives Nausea Only  . Aspirin     Cramps and diarrhea  . Ciprofloxacin     myalgias  . Codeine   . Dairy Aid [Lactase]   . Gluten Meal   . Levaquin [Levofloxacin In D5w]     hives  . Rocephin [Ceftriaxone Sodium In Dextrose]   . Simvastatin     Leg pains    HOME MEDICATIONS: Outpatient Medications Prior to Visit  Medication Sig Dispense Refill  . Azelaic Acid 15 % cream Apply topically 1 day or 1 dose. After skin is thoroughly washed and patted dry, gently but thoroughly massage a thin film of azelaic acid cream into the affected area twice daily, in the morning and evening.    [name] b complex vitamins tablet Take 1 tablet by mouth daily.    . cetirizine (ZYRTEC) 10 MG tablet Take 1 tablet (10 mg total) by mouth daily. 100 tablet 3  . Cholecalciferol (VITAMIN D-3) 1000 UNITS CAPS Take by mouth daily.    [name] dicyclomine (BENTYL) 10 MG capsule TAKE ONE CAPSULE BY MOUTH EVERY 6 TO 8 HOURS  AS NEEDED FOR CRAMPING AND SPASMS 90 capsule 1  . fluticasone (FLONASE) 50 MCG/ACT nasal spray Place 1-2 sprays into both nostrils daily.     [name] losartan (COZAAR) 50 MG tablet Take 1 tablet (50 mg total) by mouth daily. 90 tablet 3  . NONFORMULARY OR COMPOUNDED ITEM estradiol 0.01% in HRT replacement cream.  Apply 79ml vaginally twice weekly.  #30 grams 1 each 3  . Omega-3 Fatty Acids (FISH OIL) 1000 MG CAPS     . OVER THE COUNTER MEDICATION Take 800 mg by mouth 2 (two) times daily. [facility]     . Probiotic Product (PROBIOTIC PEARLS) CAPS Take 1 capsule by mouth daily.    [name] UNABLE TO FIND Betaine hydrochloride    . atorvastatin (LIPITOR) 80 MG tablet Take 1 tablet (80 mg total) by mouth daily at 6 PM. 90 tablet 3  . metoprolol tartrate (LOPRESSOR) 25 MG tablet Take 0.5 tablets (12.5 mg total) by mouth as needed (FOR PALPITATIONS). 60 tablet 0   No facility-administered medications prior to visit.     PAST MEDICAL HISTORY: Past Medical History:  Diagnosis Date  . Abnormal Pap smear of cervix   . Allergy   . Atrophic vaginitis   . Bulging disc 10/2012   CERVICAL  . Bursitis of hip   . Endometriosis   . GERD (gastroesophageal reflux disease)   . Hyperlipidemia   . Hypertension   . IBS (irritable bowel syndrome)     PAST SURGICAL HISTORY: Past Surgical History:  Procedure Laterality Date  . ABDOMINAL HYSTERECTOMY  11/1982   AUB & endometriosis, ovaries remain  . APPENDECTOMY  1/72  . BUNIONECTOMY Left 04/2004   left foot  . CERVIX LESION DESTRUCTION    . CRYOTHERAPY     for abnormal pap smear  . eyelid surgery Bilateral 2018  . HYSTEROTOMY  5/72   fetal demise, also BTL  . LAPAROSCOPIC CHOLECYSTECTOMY  02/2005  . TMJ ARTHROPLASTY  6/94   secondary to hockey injury 15 years earlier  . TUBAL LIGATION Bilateral 5/72    FAMILY HISTORY: Family History  Problem Relation Age of Onset  . Diverticulitis Mother   . Breast cancer Mother 51  . Hypertension Mother   . Heart disease Mother   . Osteoarthritis Mother   . CVA Mother   . Hypercholesterolemia Mother   . Heart disease Brother   . Hypertension Brother   . Lung cancer Brother 41       lung cancer, MI, ETOH  . Heart disease Sister   . Hypertension Sister   . Osteoarthritis Sister   . Hypertension Father   . Hypertension Maternal Uncle   . Heart disease Maternal Uncle   . Hypertension Maternal Grandfather   . Heart disease Maternal Grandfather     SOCIAL HISTORY:  Social History   Socioeconomic  History  . Marital status: Married    Spouse name: Not on file  . Number of children: Not on file  . Years of education: Not on file  . Highest education level: Not on file  Occupational History  . Not on file  Social Needs  . Financial resource strain: Not on file  . Food insecurity:    Worry: Not on file    Inability: Not on file  . Transportation needs:    Medical: Not on file    Non-medical: Not on file  Tobacco Use  . Smoking status: Never Smoker  . Smokeless tobacco: Never Used  Substance and Sexual  Activity  . Alcohol use: Yes    Comment: 1-2 glasses a year  . Drug use: No  . Sexual activity: Yes    Birth control/protection: Surgical    Comment: hysterectomy   Lifestyle  . Physical activity:    Days per week: Not on file    Minutes per session: Not on file  . Stress: Not on file  Relationships  . Social connections:    Talks on phone: Not on file    Gets together: Not on file    Attends religious service: Not on file    Active member of club or organization: Not on file    Attends meetings of clubs or organizations: Not on file    Relationship status: Not on file  . Intimate partner violence:    Fear of current or ex partner: Not on file    Emotionally abused: Not on file    Physically abused: Not on file    Forced sexual activity: Not on file  Other Topics Concern  . Not on file  Social History Narrative   Lives with husband at home.  Retired.  Education BS degree.  2-3 cups coffee daily.       PHYSICAL EXAM  GENERAL EXAM/CONSTITUTIONAL: Vitals:  Vitals:   04/03/18 0806  BP: 110/90  Pulse: 68  Weight: 143 lb 6.4 oz (65 kg)  Height: 5' 0.5" (1.537 m)    Orthostatic VS for the past 24 hrs (Last 3 readings):  BP- Lying Pulse- Lying BP- Sitting Pulse- Sitting BP- Standing at 0 minutes Pulse- Standing at 0 minutes  04/03/18 0829 120/76 58 110/90 68 124/90 57    Body mass index is 27.54 kg/m.  Visual Acuity Screening   Right eye Left eye Both  eyes  Without correction:     With correction: 20/40 20/40      Patient is in no distress; well developed, nourished and groomed; neck is supple  CARDIOVASCULAR:  Examination of carotid arteries is normal; no carotid bruits  Regular rate and rhythm, no murmurs  Examination of peripheral vascular system by observation and palpation is normal  EYES:  Ophthalmoscopic exam of optic discs and posterior segments is normal; no papilledema or hemorrhages  MUSCULOSKELETAL:  Gait, strength, tone, movements noted in Neurologic exam below  NEUROLOGIC: MENTAL STATUS:  No flowsheet data found.  awake, alert, oriented to person, place and time  recent and remote memory intact  normal attention and concentration  language fluent, comprehension intact, naming intact,   fund of knowledge appropriate  CRANIAL NERVE:   2nd - no papilledema on fundoscopic exam  2nd, 3rd, 4th, 6th - pupils equal and reactive to light, visual fields full to confrontation, extraocular muscles intact, no nystagmus  5th - facial sensation symmetric  7th - facial strength symmetric  8th - hearing intact  9th - palate elevates symmetrically, uvula midline  11th - shoulder shrug symmetric  12th - tongue protrusion midline  MOTOR:   normal bulk and tone, full strength in the BUE, BLE  SENSORY:   normal and symmetric to light touch, temperature, vibration  COORDINATION:   finger-nose-finger, fine finger movements normal  REFLEXES:   deep tendon reflexes present and symmetric  GAIT/STATION:   narrow based gait; able to walk tandem; romberg is negative    DIAGNOSTIC DATA (LABS, IMAGING, TESTING) - I reviewed patient records, labs, notes, testing and imaging myself where available.  Lab Results  Component Value Date   WBC 7.8 11/21/2017  HGB 13.3 11/21/2017   HCT 40.1 11/21/2017   MCV 86.5 11/21/2017   PLT 298.0 11/21/2017      Component Value Date/Time   NA 139 03/04/2018  0729   K 4.1 03/04/2018 0729   CL 105 03/04/2018 0729   CO2 27 03/04/2018 0729   GLUCOSE 84 03/04/2018 0729   BUN 11 03/04/2018 0729   CREATININE 0.58 03/04/2018 0729   CALCIUM 9.4 03/04/2018 0729   PROT 6.9 03/04/2018 0729   PROT 6.5 12/07/2017 0815   ALBUMIN 4.2 03/04/2018 0729   ALBUMIN 4.1 12/07/2017 0815   AST 30 03/04/2018 0729   ALT 45 (H) 03/04/2018 0729   ALKPHOS 62 03/04/2018 0729   BILITOT 0.4 03/04/2018 0729   BILITOT 0.4 12/07/2017 0815   GFRNONAA >60 07/19/2017 1037   GFRAA >60 07/19/2017 1037   Lab Results  Component Value Date   CHOL 151 03/04/2018   HDL 72.50 03/04/2018   LDLCALC 66 03/04/2018   TRIG 65.0 03/04/2018   CHOLHDL 2 03/04/2018   Lab Results  Component Value Date   HGBA1C 6.0 02/20/2017   Lab Results  Component Value Date   VITAMINB12 243 11/21/2017   Lab Results  Component Value Date   TSH 1.94 11/21/2017    01/09/18 CT coronary  1. Coronary artery calcium score 46 Agatston units. This places the patient in the 73rd percentile for age and gender, suggesting intermediate risk for future cardiac events. 2.  No significantly obstructive coronary artery disease.  12/20/17 TTE  - Left ventricle: The cavity size was normal. Wall thickness was   increased in a pattern of mild LVH. Systolic function was normal.   The estimated ejection fraction was in the range of 55% to 60%. - Mitral valve: There was mild regurgitation.    ASSESSMENT AND PLAN  67 y.o. year old female here with 2 episodes of presyncope and syncope, triggered by different factors, now resolved.  Neurologic examination is unremarkable.  By history the sound more like syncope and presyncope attacks rather than seizure or stroke.  Offered to check additional neurologic testing, but due to resolution of symptoms, normal neurologic examination, and likelihood that these were triggered presyncope and syncope attacks, we will monitor for now.   Dx: pre-syncope / syncope (triggered  by abd pain in Oct 2018; triggered by blood donation in Feb 2019)  1. Near syncope   2. Vasovagal syncope      PLAN:  - monitor symptoms - if symptoms recur, then may consider MRI brain and EEG - stay hydrated  Return if symptoms worsen or fail to improve.    Penni Bombard, MD 1/74/9449, 6:75 AM Certified in Neurology, Neurophysiology and Neuroimaging  [facility] [address] [place], [place] [address] [phone]

## 2018-05-01 ENCOUNTER — Ambulatory Visit (AMBULATORY_SURGERY_CENTER): Payer: PRIVATE HEALTH INSURANCE | Admitting: *Deleted

## 2018-05-01 VITALS — Ht 60.5 in | Wt 145.0 lb

## 2018-05-01 DIAGNOSIS — Z86010 Personal history of colonic polyps: Secondary | ICD-10-CM

## 2018-05-01 MED ORDER — NA SULFATE-K SULFATE-MG SULF 17.5-3.13-1.6 GM/177ML PO SOLN: 1.0000 | Freq: Once | ORAL | 0 refills | Status: AC

## 2018-05-01 NOTE — Progress Notes (Signed)
No egg or soy allergy known to patient  No issues with past sedation with any surgeries  or procedures, no intubation problems  No diet pills per patient No home 02 use per patient  No blood thinners per patient  Pt denies issues with constipation  No A fib or A flutter  EMMI video sent to pt's e mail pt declined   

## 2018-05-15 ENCOUNTER — Ambulatory Visit (AMBULATORY_SURGERY_CENTER): Payer: Medicare Other | Admitting: Gastroenterology

## 2018-05-15 ENCOUNTER — Encounter: Payer: Self-pay | Admitting: Gastroenterology

## 2018-05-15 VITALS — BP 142/74 | HR 56 | Temp 97.8°F | Resp 16 | Ht 60.0 in | Wt 143.0 lb

## 2018-05-15 DIAGNOSIS — I10 Essential (primary) hypertension: Secondary | ICD-10-CM | POA: Diagnosis not present

## 2018-05-15 DIAGNOSIS — D122 Benign neoplasm of ascending colon: Secondary | ICD-10-CM | POA: Diagnosis not present

## 2018-05-15 DIAGNOSIS — E669 Obesity, unspecified: Secondary | ICD-10-CM | POA: Diagnosis not present

## 2018-05-15 DIAGNOSIS — D129 Benign neoplasm of anus and anal canal: Secondary | ICD-10-CM

## 2018-05-15 DIAGNOSIS — Z86010 Personal history of colonic polyps: Secondary | ICD-10-CM | POA: Diagnosis not present

## 2018-05-15 DIAGNOSIS — D128 Benign neoplasm of rectum: Secondary | ICD-10-CM | POA: Diagnosis not present

## 2018-05-15 MED ORDER — SODIUM CHLORIDE 0.9 % IV SOLN: 500.0000 mL | Freq: Once | INTRAVENOUS | Status: DC

## 2018-05-15 NOTE — Progress Notes (Signed)
Called to room to assist during endoscopic procedure.  Patient ID and intended procedure confirmed with present staff. Received instructions for my participation in the procedure from the performing physician.  

## 2018-05-15 NOTE — Op Note (Signed)
[facility] Patient Name: Jennifer Davenport Procedure Date: 05/15/2018 9:34 AM MRN: [id number] Endoscopist: Ladene Artist , MD Age: 67 Referring MD:  Date of Birth: 1950/10/08 Gender: Female Account #: [id number] Procedure:                Colonoscopy Indications:              Surveillance: Personal history of adenomatous                            polyps on last colonoscopy 5 years ago Medicines:                Monitored Anesthesia Care Procedure:                Pre-Anesthesia Assessment:                           - Prior to the procedure, a History and Physical                            was performed, and patient medications and                            allergies were reviewed. The patient's tolerance of                            previous anesthesia was also reviewed. The risks                            and benefits of the procedure and the sedation                            options and risks were discussed with the patient.                            All questions were answered, and informed consent                            was obtained. Prior Anticoagulants: The patient has                            taken no previous anticoagulant or antiplatelet                            agents. ASA Grade Assessment: II - A patient with                            mild systemic disease. After reviewing the risks                            and benefits, the patient was deemed in                            satisfactory condition to undergo the procedure.  After obtaining informed consent, the colonoscope                            was passed under direct vision. Throughout the                            procedure, the patient's blood pressure, pulse, and                            oxygen saturations were monitored continuously. The                            Model PCF-H190DL [id number]) scope was introduced                            through the anus and  advanced to the the cecum,                            identified by appendiceal orifice and ileocecal                            valve. The ileocecal valve, appendiceal orifice,                            and rectum were photographed. The quality of the                            bowel preparation was good. The colonoscopy was                            performed without difficulty. The patient tolerated                            the procedure well. Scope In: 9:47:58 AM Scope Out: 10:00:37 AM Scope Withdrawal Time: 0 hours 10 minutes 7 seconds  Total Procedure Duration: 0 hours 12 minutes 39 seconds  Findings:                 The perianal and digital rectal examinations were                            normal.                           Three sessile polyps were found in the rectum (1)                            and ascending colon (2). The polyps were 5 to 7 mm                            in size. These polyps were removed with a cold                            snare. Resection and retrieval were complete.  Multiple small-mouthed diverticula were found in                            the left colon. There was narrowing of the colon in                            association with the diverticular opening.                           Internal hemorrhoids were found during                            retroflexion. The hemorrhoids were small and Grade                            I (internal hemorrhoids that do not prolapse).                           The exam was otherwise without abnormality on                            direct and retroflexion views. Complications:            No immediate complications. Estimated blood loss:                            None. Estimated Blood Loss:     Estimated blood loss: none. Impression:               - Three 5 to 7 mm polyps in the rectum and in the                            ascending colon, removed with a cold snare.                             Resected and retrieved.                           - Internal hemorrhoids.                           - Moderate diverticulosis in the left colon. There                            was narrowing of the colon in association with the                            diverticular opening. Recommendation:           - Repeat colonoscopy in 5 years for surveillance.                           - Patient has a contact number available for                            emergencies. The signs and  symptoms of potential                            delayed complications were discussed with the                            patient. Return to normal activities tomorrow.                            Written discharge instructions were provided to the                            patient.                           - High fiber diet.                           - Continue present medications.                           - Await pathology results. Ladene Artist, MD 05/15/2018 10:04:41 AM This report has been signed electronically.

## 2018-05-15 NOTE — Progress Notes (Signed)
Pt's states no medical or surgical changes since previsit or office visit. 

## 2018-05-15 NOTE — Progress Notes (Signed)
Report to PACU, RN, vss, BBS= Clear.  

## 2018-05-15 NOTE — Patient Instructions (Signed)
YOU HAD AN ENDOSCOPIC PROCEDURE TODAY AT [facility]:   Refer to the procedure report that was given to you for any specific questions about what was found during the examination.  If the procedure report does not answer your questions, please call your gastroenterologist to clarify.  If you requested that your care partner not be given the details of your procedure findings, then the procedure report has been included in a sealed envelope for you to review at your convenience later.  YOU SHOULD EXPECT: Some feelings of bloating in the abdomen. Passage of more gas than usual.  Walking can help get rid of the air that was put into your GI tract during the procedure and reduce the bloating. If you had a lower endoscopy (such as a colonoscopy or flexible sigmoidoscopy) you may notice spotting of blood in your stool or on the toilet paper. If you underwent a bowel prep for your procedure, you may not have a normal bowel movement for a few days.  Please Note:  You might notice some irritation and congestion in your nose or some drainage.  This is from the oxygen used during your procedure.  There is no need for concern and it should clear up in a day or so.  SYMPTOMS TO REPORT IMMEDIATELY:   Following lower endoscopy (colonoscopy or flexible sigmoidoscopy):  Excessive amounts of blood in the stool  Significant tenderness or worsening of abdominal pains  Swelling of the abdomen that is new, acute  Fever of 100F or higher  For urgent or emergent issues, a gastroenterologist can be reached at any hour by calling [phone].   DIET:  We do recommend a small meal at first, but then you may proceed to your regular diet.  Drink plenty of fluids but you should avoid alcoholic beverages for 24 hours.  ACTIVITY:  You should plan to take it easy for the rest of today and you should NOT DRIVE or use heavy machinery until tomorrow (because of the sedation medicines used during the test).     FOLLOW UP: Our staff will call the number listed on your records the next business day following your procedure to check on you and address any questions or concerns that you may have regarding the information given to you following your procedure. If we do not reach you, we will leave a message.  However, if you are feeling well and you are not experiencing any problems, there is no need to return our call.  We will assume that you have returned to your regular daily activities without incident.  If any biopsies were taken you will be contacted by phone or by letter within the next 1-3 weeks.  Please call us at [phone] if you have not heard about the biopsies in 3 weeks.   Await for biopsy results Polyps (handout given) Diverticulosis (handout given) High Fiber Diet (handout given) Repeat next colonoscopy screening in 5 years  SIGNATURES/CONFIDENTIALITY: You and/or your care partner have signed paperwork which will be entered into your electronic medical record.  These signatures attest to the fact that that the information above on your After Visit Summary has been reviewed and is understood.  Full responsibility of the confidentiality of this discharge information lies with you and/or your care-partner.

## 2018-05-16 ENCOUNTER — Telehealth: Payer: Self-pay | Admitting: *Deleted

## 2018-05-16 NOTE — Telephone Encounter (Signed)
No answer for post procedure call back. Left message and will attempt to call back later this afternoon. Sm 

## 2018-05-21 ENCOUNTER — Encounter: Payer: Self-pay | Admitting: Gastroenterology

## 2018-06-19 ENCOUNTER — Other Ambulatory Visit: Payer: Self-pay | Admitting: *Deleted

## 2018-06-19 NOTE — Telephone Encounter (Addendum)
Medication refill request: estradiol 0.02% compounded  Last AEX:  07/06/17 DL Next AEX: 07/09/18 DL Last MMG (if hormonal medication request): 11/05/17 [id number]:neg  Refill authorized: 10/15/17 #[id number]/3R   Note from pharmacy:  "Estradiol 0.01% is commercially available. Compounded version is estradiol 0.02%.  They need new Rx for compounded Rx (0.02%) or new Rx for generic estrace (0.01%)." whichever you want for patient.    Please advise.

## 2018-06-20 MED ORDER — NONFORMULARY OR COMPOUNDED ITEM: 0 refills | Status: DC

## 2018-06-20 NOTE — Telephone Encounter (Signed)
Rx faxed to pharmacy by Larene Beach

## 2018-07-04 DIAGNOSIS — H25043 Posterior subcapsular polar age-related cataract, bilateral: Secondary | ICD-10-CM | POA: Diagnosis not present

## 2018-07-04 DIAGNOSIS — H2513 Age-related nuclear cataract, bilateral: Secondary | ICD-10-CM | POA: Diagnosis not present

## 2018-07-04 DIAGNOSIS — H5213 Myopia, bilateral: Secondary | ICD-10-CM | POA: Diagnosis not present

## 2018-07-09 ENCOUNTER — Ambulatory Visit (INDEPENDENT_AMBULATORY_CARE_PROVIDER_SITE_OTHER): Payer: Medicare Other | Admitting: Certified Nurse Midwife

## 2018-07-09 ENCOUNTER — Encounter: Payer: Self-pay | Admitting: Certified Nurse Midwife

## 2018-07-09 ENCOUNTER — Other Ambulatory Visit: Payer: Self-pay

## 2018-07-09 VITALS — BP 120/80 | HR 68 | Resp 16 | Ht 60.0 in | Wt 149.0 lb

## 2018-07-09 DIAGNOSIS — Z01419 Encounter for gynecological examination (general) (routine) without abnormal findings: Secondary | ICD-10-CM

## 2018-07-09 DIAGNOSIS — Z124 Encounter for screening for malignant neoplasm of cervix: Secondary | ICD-10-CM

## 2018-07-09 DIAGNOSIS — N952 Postmenopausal atrophic vaginitis: Secondary | ICD-10-CM | POA: Diagnosis not present

## 2018-07-09 DIAGNOSIS — N951 Menopausal and female climacteric states: Secondary | ICD-10-CM

## 2018-07-09 MED ORDER — NONFORMULARY OR COMPOUNDED ITEM: 3 refills | Status: DC

## 2018-07-09 NOTE — Progress Notes (Signed)
67 y.o. [id number] Married  Caucasian Fe here for annual exam Denies vaginal bleeding or vaginal dryness, continues to use Estrace cream with good results.[facility] Dr.Plotnikov for aex, labs and hypertension. Medication stable at this time with adjustment. No health issues today.  Patient's last menstrual period was 11/24/1982.          Sexually active: Yes.    The current method of family planning is status post hysterectomy.    Exercising: Yes.    yoga, walking, resistance exercise Smoker:  no  Review of Systems  Constitutional: Negative.   HENT: Negative.   Eyes: Negative.   Respiratory: Negative.   Cardiovascular: Negative.   Gastrointestinal: Negative.   Genitourinary: Negative.   Musculoskeletal: Negative.   Skin: Negative.   Neurological: Negative.   Endo/Heme/Allergies: Negative.   Psychiatric/Behavioral: Negative.     Health Maintenance: Pap: 06-30-16 neg HPV HR neg History of Abnormal Pap: yes MMG:  11-05-17 category c density birads 1:neg Self Breast exams: no Colonoscopy:  2019 f/u 28yrs BMD:   2014 TDaP:  2014 Shingles: yes Pneumonia: 2019 Hep C and HIV: hep c neg 2017 Labs: PCP   reports that she has never smoked. She has never used smokeless tobacco. She reports that she drank alcohol. She reports that she does not use drugs.  Past Medical History:  Diagnosis Date  . Abnormal Pap smear of cervix   . Allergy   . Atrophic vaginitis   . Bulging disc 10/2012   CERVICAL  . Bursitis of hip   . Cataract    mild starting of   . Endometriosis   . GERD (gastroesophageal reflux disease)   . Hyperlipidemia   . Hypertension   . IBS (irritable bowel syndrome)     Past Surgical History:  Procedure Laterality Date  . ABDOMINAL HYSTERECTOMY  11/1982   AUB & endometriosis, ovaries remain  . APPENDECTOMY  1/72  . BUNIONECTOMY Left 04/2004   left foot  . CERVIX LESION DESTRUCTION    . COLONOSCOPY    . CRYOTHERAPY     for abnormal pap smear  . eyelid surgery Bilateral  2018  . HYSTEROTOMY  5/72   fetal demise, also BTL  . LAPAROSCOPIC CHOLECYSTECTOMY  02/2005  . POLYPECTOMY    . TMJ ARTHROPLASTY  6/94   secondary to hockey injury 15 years earlier  . TUBAL LIGATION Bilateral 5/72  . UPPER GASTROINTESTINAL ENDOSCOPY      Current Outpatient Medications  Medication Sig Dispense Refill  . acetaminophen (TYLENOL) 500 MG tablet Take 500 mg by mouth every 6 (six) hours as needed.    . Azelaic Acid 15 % cream Apply topically 1 day or 1 dose. After skin is thoroughly washed and patted dry, gently but thoroughly massage a thin film of azelaic acid cream into the affected area twice daily, in the morning and evening.    [name] b complex vitamins tablet Take 1 tablet by mouth daily.    . cetirizine (ZYRTEC) 10 MG tablet Take 1 tablet (10 mg total) by mouth daily. 100 tablet 3  . Cholecalciferol (VITAMIN D-3) 1000 UNITS CAPS Take by mouth daily.    [name] dicyclomine (BENTYL) 10 MG capsule TAKE ONE CAPSULE BY MOUTH EVERY 6 TO 8 HOURS AS NEEDED FOR CRAMPING AND SPASMS 90 capsule 1  . fluticasone (FLONASE) 50 MCG/ACT nasal spray Place 1-2 sprays into both nostrils daily.     [name] losartan (COZAAR) 50 MG tablet Take 1 tablet (50 mg total) by mouth daily. [place]  tablet 3  . NONFORMULARY OR COMPOUNDED ITEM estradiol 0.02% in HRT replacement cream.  Apply 36ml vaginally twice weekly.  #30 grams 1 each 0  . Omega-3 Fatty Acids (FISH OIL) 1000 MG CAPS     . OVER THE COUNTER MEDICATION Take 800 mg by mouth 2 (two) times daily. [place]    . triamcinolone cream (KENALOG) 0.1 %     . UNABLE TO FIND Betaine hydrochloride as needed    . atorvastatin (LIPITOR) 80 MG tablet Take 1 tablet (80 mg total) by mouth daily at 6 PM. 90 tablet 3   No current facility-administered medications for this visit.     Family History  Problem Relation Age of Onset  . Diverticulitis Mother   . Breast cancer Mother 1  . Hypertension Mother   . Heart disease Mother   . Osteoarthritis Mother   . CVA Mother    . Hypercholesterolemia Mother   . Colon polyps Mother   . Heart disease Brother   . Hypertension Brother   . Lung cancer Brother 70       lung cancer, MI, ETOH  . Heart disease Sister   . Hypertension Sister   . Osteoarthritis Sister   . Hypertension Father   . Hypertension Maternal Uncle   . Heart disease Maternal Uncle   . Hypertension Maternal Grandfather   . Heart disease Maternal Grandfather   . Colon cancer Neg Hx   . Rectal cancer Neg Hx   . Stomach cancer Neg Hx     ROS:  Pertinent items are noted in HPI.  Otherwise, a comprehensive ROS was negative.  Exam:   BP 120/80   Pulse 68   Resp 16   Ht 5' (1.524 m)   Wt 149 lb (67.6 kg)   LMP 11/24/1982   BMI 29.10 kg/m  Height: 5' (152.4 cm) Ht Readings from Last 3 Encounters:  07/09/18 5' (1.524 m)  05/15/18 5' (1.524 m)  05/01/18 5' 0.5" (1.537 m)    General appearance: alert, cooperative and appears stated age Head: Normocephalic, without obvious abnormality, atraumatic Neck: no adenopathy, supple, symmetrical, trachea midline and thyroid normal to inspection and palpation Lungs: clear to auscultation bilaterally Breasts: normal appearance, no masses or tenderness, No nipple retraction or dimpling, No nipple discharge or bleeding, No axillary or supraclavicular adenopathy Heart: regular rate and rhythm Abdomen: soft, non-tender; no masses,  no organomegaly Extremities: extremities normal, atraumatic, no cyanosis or edema Skin: Skin color, texture, turgor normal. No rashes or lesions Lymph nodes: Cervical, supraclavicular, and axillary nodes normal. No abnormal inguinal nodes palpated Neurologic: Grossly normal   Pelvic: External genitalia:  no lesions              Urethra:  normal appearing urethra with no masses, tenderness or lesions              Bartholin's and Skene's: normal                 Vagina: normal appearing vagina with normal color and discharge, no lesions              Cervix: multiparous  appearance, no cervical motion tenderness, no lesions and cervix has meshed to posterior vaginal wall              Pap taken: No. Bimanual Exam:  Uterus:  normal size, contour, position, consistency, mobility, non-tender and anteverted              Adnexa: normal adnexa  and no mass, fullness, tenderness               Rectovaginal: Confirms               Anus:  normal sphincter tone, no lesions  Chaperone present: yes  A:  Well Woman with normal exam  Menopausal no HRT  Atrophic vaginitis, Estrace cream working well  Hypertension with PCP management  P:   Reviewed health and wellness pertinent to exam  Aware of need to advise if vaginal bleeding  Risks/benefits/warning signs given with Estrace desires continuance.  Rx Estrace see order with instructions  Pap smear: no   counseled on breast self exam, mammography screening, feminine hygiene, adequate intake of calcium and vitamin D, diet and exercise, Kegel's exercises  return annually or prn  An After Visit Summary was printed and given to the patient.

## 2018-07-28 ENCOUNTER — Other Ambulatory Visit: Payer: Self-pay | Admitting: Internal Medicine

## 2018-08-02 ENCOUNTER — Ambulatory Visit (INDEPENDENT_AMBULATORY_CARE_PROVIDER_SITE_OTHER): Payer: Medicare Other

## 2018-08-02 DIAGNOSIS — Z23 Encounter for immunization: Secondary | ICD-10-CM | POA: Diagnosis not present

## 2018-08-13 ENCOUNTER — Ambulatory Visit: Payer: Medicare Other

## 2018-09-05 ENCOUNTER — Ambulatory Visit: Payer: Medicare Other | Admitting: Internal Medicine

## 2018-09-23 ENCOUNTER — Other Ambulatory Visit: Payer: Self-pay | Admitting: Internal Medicine

## 2018-09-23 DIAGNOSIS — Z1231 Encounter for screening mammogram for malignant neoplasm of breast: Secondary | ICD-10-CM

## 2018-09-26 DIAGNOSIS — M79672 Pain in left foot: Secondary | ICD-10-CM | POA: Diagnosis not present

## 2018-09-26 DIAGNOSIS — M21612 Bunion of left foot: Secondary | ICD-10-CM | POA: Diagnosis not present

## 2018-09-26 DIAGNOSIS — M2022 Hallux rigidus, left foot: Secondary | ICD-10-CM | POA: Diagnosis not present

## 2018-09-26 DIAGNOSIS — M7742 Metatarsalgia, left foot: Secondary | ICD-10-CM | POA: Diagnosis not present

## 2018-09-26 DIAGNOSIS — M79671 Pain in right foot: Secondary | ICD-10-CM | POA: Diagnosis not present

## 2018-09-26 DIAGNOSIS — M7741 Metatarsalgia, right foot: Secondary | ICD-10-CM | POA: Diagnosis not present

## 2018-10-06 ENCOUNTER — Emergency Department (HOSPITAL_BASED_OUTPATIENT_CLINIC_OR_DEPARTMENT_OTHER): Payer: Medicare Other

## 2018-10-06 ENCOUNTER — Emergency Department (HOSPITAL_BASED_OUTPATIENT_CLINIC_OR_DEPARTMENT_OTHER)
Admission: EM | Admit: 2018-10-06 | Discharge: 2018-10-06 | Disposition: A | Discharge status: Home / Self Care | Payer: Medicare Other | Attending: Emergency Medicine | Admitting: Emergency Medicine

## 2018-10-06 ENCOUNTER — Other Ambulatory Visit: Payer: Self-pay

## 2018-10-06 ENCOUNTER — Encounter (HOSPITAL_BASED_OUTPATIENT_CLINIC_OR_DEPARTMENT_OTHER): Payer: Self-pay | Admitting: Emergency Medicine

## 2018-10-06 DIAGNOSIS — R51 Headache: Secondary | ICD-10-CM | POA: Insufficient documentation

## 2018-10-06 DIAGNOSIS — I2510 Atherosclerotic heart disease of native coronary artery without angina pectoris: Secondary | ICD-10-CM | POA: Insufficient documentation

## 2018-10-06 DIAGNOSIS — I10 Essential (primary) hypertension: Secondary | ICD-10-CM | POA: Diagnosis not present

## 2018-10-06 DIAGNOSIS — Z79899 Other long term (current) drug therapy: Secondary | ICD-10-CM | POA: Insufficient documentation

## 2018-10-06 DIAGNOSIS — R002 Palpitations: Secondary | ICD-10-CM | POA: Insufficient documentation

## 2018-10-06 DIAGNOSIS — I6381 Other cerebral infarction due to occlusion or stenosis of small artery: Secondary | ICD-10-CM | POA: Diagnosis not present

## 2018-10-06 LAB — BASIC METABOLIC PANEL
Anion gap: 8 (ref 5–15)
BUN: 14 mg/dL (ref 8–23)
CO2: 25 mmol/L (ref 22–32)
Calcium: 9.5 mg/dL (ref 8.9–10.3)
Chloride: 105 mmol/L (ref 98–111)
Creatinine, Ser: 0.54 mg/dL (ref 0.44–1.00)
GFR calc Af Amer: >60 mL/min (ref >60)
GFR calc non Af Amer: >60 mL/min (ref >60)
Glucose, Bld: 94 mg/dL (ref 70–99)
Potassium: 3.4 mmol/L — ABNORMAL LOW (ref 3.5–5.1)
Sodium: 138 mmol/L (ref 135–145)

## 2018-10-06 LAB — CBC
HCT: 39.1 % (ref 36.0–46.0)
Hemoglobin: 12.2 g/dL (ref 12.0–15.0)
MCH: 27.9 pg (ref 26.0–34.0)
MCHC: 31.2 g/dL (ref 30.0–36.0)
MCV: 89.5 fL (ref 80.0–100.0)
Platelets: 254 10*3/uL (ref 150–400)
RBC: 4.37 MIL/uL (ref 3.87–5.11)
RDW: 12.6 % (ref 11.5–15.5)
WBC: 8.4 10*3/uL (ref 4.0–10.5)
nRBC: 0 % (ref 0.0–0.2)

## 2018-10-06 LAB — TROPONIN I
Troponin I: <0.03 ng/mL (ref <0.03)
Troponin I: <0.03 ng/mL (ref <0.03)

## 2018-10-06 LAB — D-DIMER, QUANTITATIVE: D-Dimer, Quant: <0.27 ug/mL-FEU (ref 0.00–0.50)

## 2018-10-06 NOTE — ED Notes (Signed)
Pt in CT.

## 2018-10-06 NOTE — ED Provider Notes (Signed)
[facility] Provider Note   CSN: [id number] Arrival date & time: 10/06/18  0006     History   Chief Complaint Chief Complaint  Patient presents with  . Hypertension  . Headache  . Chest Pain    HPI Jennifer Davenport is a 68 y.o. female.  The history is provided by the patient.  Hypertension  This is a chronic problem. The current episode started more than 1 week ago. The problem occurs constantly. The problem has been rapidly worsening. Associated symptoms include headaches. Pertinent negatives include no chest pain, no abdominal pain and no shortness of breath. Associated symptoms comments: Palpitations.  At rest though she was out all day trying to find a certain type of sweat pants and was unsuccessful. Nothing aggravates the symptoms. Nothing relieves the symptoms. She has tried nothing for the symptoms. The treatment provided no relief.  Headache  Pain location:  Occipital Quality:  Sharp Radiates to:  Does not radiate Severity currently:  0/10 Severity at highest:  6/10 Onset quality:  Gradual Timing:  Constant Progression:  Resolved Context: not activity, not coughing and not exposure to cold air   Relieved by:  Acetaminophen Worsened by:  Nothing Associated symptoms: no abdominal pain, no back pain, no cough, no dizziness, no focal weakness, no hearing loss, no loss of balance, no myalgias, no neck stiffness, no numbness, no paresthesias, no photophobia, no seizures, no vomiting and no weakness   Risk factors: no anger and no family hx of SAH   Palpitations  Palpitations quality:  Regular Onset quality:  Sudden Timing:  Constant Progression:  Resolved Chronicity:  Recurrent Context: not appetite suppressants and not blood loss   Relieved by:  Nothing Worsened by:  Nothing Ineffective treatments:  Beta blockers Associated symptoms: no back pain, no chest pain, no cough, no diaphoresis, no dizziness, no leg pain, no numbness, no  orthopnea, no shortness of breath, no vomiting and no weakness   Risk factors: no diabetes mellitus and no hypercoagulable state     Past Medical History:  Diagnosis Date  . Abnormal Pap smear of cervix   . Allergy   . Atrophic vaginitis   . Bulging disc 10/2012   CERVICAL  . Bursitis of hip   . Cataract    mild starting of   . Endometriosis   . GERD (gastroesophageal reflux disease)   . Hyperlipidemia   . Hypertension   . IBS (irritable bowel syndrome)     Patient Active Problem List   Diagnosis Date Noted  . Syncope 04/03/2018  . CAD (coronary artery disease) 12/06/2017  . Near syncope 08/03/2017  . Gluten intolerance 08/03/2017  . Droopy eyelid, bilateral 08/03/2017  . Actinic keratosis 08/22/2016  . Carotid bruit 08/17/2015  . Well adult exam 02/11/2015  . Benign paroxysmal positional vertigo 04/06/2014  . NUMBNESS, HAND 03/06/2007  . Dyslipidemia 09/28/2006  . Essential hypertension 09/28/2006  . ALLERGIC RHINITIS 09/28/2006  . 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 GERD 09/28/2006  . Irritable bowel syndrome 09/28/2006  . Osteoarthritis 09/28/2006  . Arthropathy 09/28/2006  . LOW BACK PAIN 09/28/2006    Past Surgical History:  Procedure Laterality Date  . ABDOMINAL HYSTERECTOMY  11/1982   AUB & endometriosis, ovaries remain  . APPENDECTOMY  1/72  . BUNIONECTOMY Left 04/2004   left foot  . CERVIX LESION DESTRUCTION    . COLONOSCOPY    . CRYOTHERAPY     for abnormal pap smear  . eyelid surgery Bilateral  2018  . HYSTEROTOMY  5/72   fetal demise, also BTL  . LAPAROSCOPIC CHOLECYSTECTOMY  02/2005  . POLYPECTOMY    . TMJ ARTHROPLASTY  6/94   secondary to hockey injury 15 years earlier  . TUBAL LIGATION Bilateral 5/72  . UPPER GASTROINTESTINAL ENDOSCOPY       OB History    Gravida  3   Para  2   Term  2   Preterm  0   AB  1   Living  2     SAB  1   TAB  0   Ectopic  0   Multiple  0   Live Births  2            Home Medications     Prior to Admission medications   Medication Sig Start Date End Date Taking? Authorizing Provider  acetaminophen (TYLENOL) 500 MG tablet Take 500 mg by mouth every 6 (six) hours as needed.    [provider]  atorvastatin (LIPITOR) 80 MG tablet Take 1 tablet (80 mg total) by mouth daily at 6 PM. 12/10/17 05/15/18  Lendon Colonel, NP  Azelaic Acid 15 % cream Apply topically 1 day or 1 dose. After skin is thoroughly washed and patted dry, gently but thoroughly massage a thin film of azelaic acid cream into the affected area twice daily, in the morning and evening.    [provider]  b complex vitamins tablet Take 1 tablet by mouth daily.    [provider]  cetirizine (ZYRTEC) 10 MG tablet Take 1 tablet (10 mg total) by mouth daily. 01/30/14   Plotnikov, Evie Lacks, MD  Cholecalciferol (VITAMIN D-3) 1000 UNITS CAPS Take by mouth daily.    [provider]  dicyclomine (BENTYL) 10 MG capsule TAKE ONE CAPSULE BY MOUTH EVERY 6 TO 8 HOURS AS NEEDED FOR CRAMPING AND SPASMS 03/04/18   Plotnikov, Evie Lacks, MD  fluticasone (FLONASE) 50 MCG/ACT nasal spray Place 1-2 sprays into both nostrils daily.     [provider]  losartan (COZAAR) 50 MG tablet TAKE 1 TABLET EACH DAY. 07/29/18   Plotnikov, Evie Lacks, MD  NONFORMULARY OR COMPOUNDED ITEM estradiol 0.02% in HRT replacement cream.  Apply 27ml vaginally twice weekly.  #30 grams 07/09/18   Regina Eck, CNM  Omega-3 Fatty Acids (FISH OIL) 1000 MG CAPS     [provider]  OVER THE COUNTER MEDICATION Take 800 mg by mouth 2 (two) times daily. [facility]    [provider]  triamcinolone cream (KENALOG) 0.1 %  03/06/18   [provider]  UNABLE TO FIND Betaine hydrochloride as needed    [provider]    Family History Family History  Problem Relation Age of Onset  . Diverticulitis Mother   . Breast cancer Mother 34  . Hypertension Mother   . Heart disease Mother   .  Osteoarthritis Mother   . CVA Mother   . Hypercholesterolemia Mother   . Colon polyps Mother   . Heart disease Brother   . Hypertension Brother   . Lung cancer Brother 29       lung cancer, MI, ETOH  . Heart disease Sister   . Hypertension Sister   . Osteoarthritis Sister   . Hypertension Father   . Hypertension Maternal Uncle   . Heart disease Maternal Uncle   . Hypertension Maternal Grandfather   . Heart disease Maternal Grandfather   . Colon cancer Neg Hx   .  Rectal cancer Neg Hx   . Stomach cancer Neg Hx     Social History Social History   Tobacco Use  . Smoking status: Never Smoker  . Smokeless tobacco: Never Used  Substance Use Topics  . Alcohol use: Not Currently  . Drug use: No     Allergies   Ceftriaxone sodium; Biaxin [clarithromycin]; Sulfonamide derivatives; Aspirin; Ciprofloxacin; Codeine; Dairy aid [lactase]; Gluten meal; Levaquin [levofloxacin in d5w]; Rocephin [ceftriaxone sodium in dextrose]; and Simvastatin   Review of Systems Review of Systems  Constitutional: Negative for diaphoresis.  HENT: Negative for hearing loss.   Eyes: Negative for photophobia.  Respiratory: Negative for cough and shortness of breath.   Cardiovascular: Positive for palpitations. Negative for chest pain, orthopnea and leg swelling.  Gastrointestinal: Negative for abdominal pain and vomiting.  Musculoskeletal: Negative for back pain, myalgias and neck stiffness.  Neurological: Positive for headaches. Negative for dizziness, focal weakness, seizures, facial asymmetry, speech difficulty, weakness, numbness, paresthesias and loss of balance.  All other systems reviewed and are negative.    Physical Exam Updated Vital Signs BP (!) 150/77   Pulse 63   Temp 98 F (36.7 C) (Oral)   Resp 11   Ht 5\' 1"  (1.549 m)   Wt 67.1 kg   LMP 11/24/1982   SpO2 99%   BMI 27.96 kg/m   Physical Exam Constitutional:      General: She is not in acute distress.    Appearance: She is  well-developed. She is obese. She is not toxic-appearing.  HENT:     Head: Normocephalic and atraumatic.     Mouth/Throat:     Mouth: Mucous membranes are moist.  Eyes:     Extraocular Movements: Extraocular movements intact.     Pupils: Pupils are equal, round, and reactive to light.  Neck:     Musculoskeletal: Normal range of motion and neck supple.  Cardiovascular:     Rate and Rhythm: Normal rate and regular rhythm.     Heart sounds: Normal heart sounds.  Pulmonary:     Effort: Pulmonary effort is normal.     Breath sounds: Normal breath sounds.  Abdominal:     General: Bowel sounds are normal.     Palpations: Abdomen is soft.  Musculoskeletal: Normal range of motion.  Skin:    General: Skin is warm and dry.     Capillary Refill: Capillary refill takes less than 2 seconds.  Neurological:     Mental Status: She is alert and oriented to person, place, and time.     GCS: GCS eye subscore is 4. GCS verbal subscore is 5. GCS motor subscore is 6.     Cranial Nerves: No cranial nerve deficit.     Deep Tendon Reflexes: Reflexes normal.  Psychiatric:        Mood and Affect: Mood is anxious.      ED Treatments / Results  Labs (all labs ordered are listed, but only abnormal results are displayed) Results for orders placed or performed during the hospital encounter of 36/64/40  Basic metabolic panel  Result Value Ref Range   Sodium 138 135 - 145 mmol/L   Potassium 3.4 (L) 3.5 - 5.1 mmol/L   Chloride 105 98 - 111 mmol/L   CO2 25 22 - 32 mmol/L   Glucose, Bld 94 70 - 99 mg/dL   BUN 14 8 - 23 mg/dL   Creatinine, Ser 0.54 0.44 - 1.00 mg/dL   Calcium 9.5 8.9 - 10.3 mg/dL  GFR calc non Af Amer >60 >60 mL/min   GFR calc Af Amer >60 >60 mL/min   Anion gap 8 5 - 15  CBC  Result Value Ref Range   WBC 8.4 4.0 - 10.5 K/uL   RBC 4.37 3.87 - 5.11 MIL/uL   Hemoglobin 12.2 12.0 - 15.0 g/dL   HCT 39.1 36.0 - 46.0 %   MCV 89.5 80.0 - 100.0 fL   MCH 27.9 26.0 - 34.0 pg   MCHC 31.2  30.0 - 36.0 g/dL   RDW 12.6 11.5 - 15.5 %   Platelets 254 150 - 400 K/uL   nRBC 0.0 0.0 - 0.2 %  Troponin I - ONCE - STAT  Result Value Ref Range   Troponin I <0.03 <0.03 ng/mL  D-dimer, quantitative (not at [facility])  Result Value Ref Range   D-Dimer, Quant <0.27 0.00 - 0.50 ug/mL-FEU  Troponin I - Once-Timed  Result Value Ref Range   Troponin I <0.03 <0.03 ng/mL   Dg Chest 2 View  Result Date: 10/06/2018 CLINICAL DATA:  Acute onset chest heaviness and headache for several hours. Hypertension. Nonsmoker. EXAM: CHEST - 2 VIEW COMPARISON:  07/19/2017 FINDINGS: The heart size and mediastinal contours are within normal limits. Both lungs are clear. The visualized skeletal structures are unremarkable. IMPRESSION: No active cardiopulmonary disease. Electronically Signed   By: Lucienne Capers M.D.   On: 10/06/2018 01:43   Ct Head Wo Contrast  Result Date: 10/06/2018 CLINICAL DATA:  Pt states that on and off today she felt she could not take a deep breath. Worsened tonight. Pt states that she has a neck traction machine that she uses. She laid down to use the machine and felt like something was sitting on her chest EXAM: CT HEAD WITHOUT CONTRAST TECHNIQUE: Contiguous axial images were obtained from the base of the skull through the vertex without intravenous contrast. COMPARISON:  None. FINDINGS: Brain: No acute territorial infarction, hemorrhage or intracranial mass. 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 Mild small vessel ischemic changes of the white matter. Mild atrophy. Nonenlarged ventricles. Vascular: No hyperdense vessels.  Carotid vascular calcification Skull: Normal. Negative for fracture or focal lesion. Sinuses/Orbits: No acute finding. Other: None IMPRESSION: 1. No CT evidence for acute intracranial abnormality. 2. Atrophy and mild small vessel ischemic changes of the white matter. Chronic appearing lacunar infarct in the left basal ganglia Electronically Signed   By: Donavan Foil M.D.    On: 10/06/2018 01:42    EKG  EKG Interpretation  Date/Time:  Sunday October 06 2018 00:16:18 EST Ventricular Rate:  66 PR Interval:    QRS Duration: 140 QT Interval:  429 QTC Calculation: 450 R Axis:   30 Text Interpretation:  Sinus rhythm Nonspecific intraventricular conduction delay Confirmed by Dory Horn) on 10/06/2018 6:58:44 AM       Radiology Dg Chest 2 View  Result Date: 10/06/2018 CLINICAL DATA:  Acute onset chest heaviness and headache for several hours. Hypertension. Nonsmoker. EXAM: CHEST - 2 VIEW COMPARISON:  07/19/2017 FINDINGS: The heart size and mediastinal contours are within normal limits. Both lungs are clear. The visualized skeletal structures are unremarkable. IMPRESSION: No active cardiopulmonary disease. Electronically Signed   By: Lucienne Capers M.D.   On: 10/06/2018 01:43   Ct Head Wo Contrast  Result Date: 10/06/2018 CLINICAL DATA:  Pt states that on and off today she felt she could not take a deep breath. Worsened tonight. Pt states that she has a neck traction machine that she  uses. She laid down to use the machine and felt like something was sitting on her chest EXAM: CT HEAD WITHOUT CONTRAST TECHNIQUE: Contiguous axial images were obtained from the base of the skull through the vertex without intravenous contrast. COMPARISON:  None. FINDINGS: Brain: No acute territorial infarction, hemorrhage or intracranial mass. 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 Mild small vessel ischemic changes of the white matter. Mild atrophy. Nonenlarged ventricles. Vascular: No hyperdense vessels.  Carotid vascular calcification Skull: Normal. Negative for fracture or focal lesion. Sinuses/Orbits: No acute finding. Other: None IMPRESSION: 1. No CT evidence for acute intracranial abnormality. 2. Atrophy and mild small vessel ischemic changes of the white matter. Chronic appearing lacunar infarct in the left basal ganglia Electronically Signed   By: Donavan Foil  M.D.   On: 10/06/2018 01:42    Procedures Procedures (including critical care time)    Final Clinical Impressions(s) / ED Diagnoses   Final diagnoses:  Hypertension, unspecified type  Palpitations  No ectopy on the monitor in the ED.  No tachycardia.  Ruled out for Mi with 2 negative troponins, ruled out for PE with a negative ddimer in a low risk patient and negative CT within 3 hours of HA without SAH.  Patient observed in the ED.   She is stable for discharge with close follow up with her PMD for potential holter monitor.    Return for pain, intractable cough, productive cough,fevers >100.4 unrelieved by medication, shortness of breath, intractable vomiting, or diarrhea, abdominal pain, Inability to tolerate liquids or food, cough, altered mental status or any concerns. No signs of systemic illness or infection. The patient is nontoxic-appearing on exam and vital signs are within normal limits.   I have reviewed the triage vital signs and the nursing notes. Pertinent labs &imaging results that were available during my care of the patient were reviewed by me and considered in my medical decision making (see chart for details).  After history, exam, and medical workup I feel the patient has been appropriately medically screened and is safe for discharge home. Pertinent diagnoses were discussed with the patient. Patient was given return precautions.      Palumbo, April, MD 10/06/18 [phone]

## 2018-10-06 NOTE — ED Triage Notes (Signed)
Pt states that on and off today she felt she could not take a deep breath. Worsened tonight. Pt states that she has a neck traction machine that she uses. She laid down to use the machine and felt like something was sitting on her chest. Checked her BP at home and it read 165/102. Pt states she took a metoprolol for heart palpitations but did not help the BP

## 2018-10-08 ENCOUNTER — Ambulatory Visit (INDEPENDENT_AMBULATORY_CARE_PROVIDER_SITE_OTHER): Payer: Medicare Other | Admitting: Internal Medicine

## 2018-10-08 ENCOUNTER — Encounter: Payer: Self-pay | Admitting: Internal Medicine

## 2018-10-08 VITALS — BP 132/84 | HR 75 | Temp 97.9°F | Ht 61.0 in | Wt 149.0 lb

## 2018-10-08 DIAGNOSIS — M542 Cervicalgia: Secondary | ICD-10-CM

## 2018-10-08 DIAGNOSIS — I10 Essential (primary) hypertension: Secondary | ICD-10-CM | POA: Diagnosis not present

## 2018-10-08 DIAGNOSIS — F410 Panic disorder [episodic paroxysmal anxiety] without agoraphobia: Secondary | ICD-10-CM | POA: Diagnosis not present

## 2018-10-08 DIAGNOSIS — I25119 Atherosclerotic heart disease of native coronary artery with unspecified angina pectoris: Secondary | ICD-10-CM

## 2018-10-08 DIAGNOSIS — F419 Anxiety disorder, unspecified: Secondary | ICD-10-CM | POA: Insufficient documentation

## 2018-10-08 MED ORDER — CLONAZEPAM 0.25 MG PO TBDP: 0.2500 mg | ORAL_TABLET | Freq: Two times a day (BID) | ORAL | 0 refills | Status: DC | PRN

## 2018-10-08 NOTE — Progress Notes (Signed)
Subjective:  Patient ID: Jennifer Davenport, female    DOB: 02/18/1951  Age: 68 y.o. MRN: [id number]  CC: No chief complaint on file.   HPI Jennifer Davenport presents for ER f/u for SOB, CP/heaviness and HA on 1/12  H/o panic attacks - remote   Outpatient Medications Prior to Visit  Medication Sig Dispense Refill  . acetaminophen (TYLENOL) 500 MG tablet Take 500 mg by mouth every 6 (six) hours as needed.    . Azelaic Acid 15 % cream Apply topically 1 day or 1 dose. After skin is thoroughly washed and patted dry, gently but thoroughly massage a thin film of azelaic acid cream into the affected area twice daily, in the morning and evening.    [name] b complex vitamins tablet Take 1 tablet by mouth daily.    . cetirizine (ZYRTEC) 10 MG tablet Take 1 tablet (10 mg total) by mouth daily. 100 tablet 3  . Cholecalciferol (VITAMIN D-3) 1000 UNITS CAPS Take by mouth daily.    [name] dicyclomine (BENTYL) 10 MG capsule TAKE ONE CAPSULE BY MOUTH EVERY 6 TO 8 HOURS AS NEEDED FOR CRAMPING AND SPASMS 90 capsule 1  . fluticasone (FLONASE) 50 MCG/ACT nasal spray Place 1-2 sprays into both nostrils daily.     [name] losartan (COZAAR) 50 MG tablet TAKE 1 TABLET EACH DAY. 90 tablet 3  . NONFORMULARY OR COMPOUNDED ITEM estradiol 0.02% in HRT replacement cream.  Apply 60ml vaginally twice weekly.  #30 grams 1 each 3  . Omega-3 Fatty Acids (FISH OIL) 1000 MG CAPS     . OVER THE COUNTER MEDICATION Take 800 mg by mouth 2 (two) times daily. Holy Basil    . triamcinolone cream (KENALOG) 0.1 %     . UNABLE TO FIND Betaine hydrochloride as needed    . atorvastatin (LIPITOR) 80 MG tablet Take 1 tablet (80 mg total) by mouth daily at 6 PM. 90 tablet 3   No facility-administered medications prior to visit.     ROS: Review of Systems  Constitutional: Negative for activity change, appetite change, chills, fatigue and unexpected weight change.  HENT: Negative for congestion, mouth sores and sinus pressure.   Eyes: Negative for  visual disturbance.  Respiratory: Negative for cough and chest tightness.   Gastrointestinal: Negative for abdominal pain and nausea.  Genitourinary: Negative for difficulty urinating, frequency and vaginal pain.  Musculoskeletal: Positive for neck pain and neck stiffness. Negative for back pain and gait problem.  Skin: Negative for pallor and rash.  Neurological: Negative for dizziness, tremors, weakness, numbness and headaches.  Psychiatric/Behavioral: Negative for confusion and sleep disturbance.    Objective:  BP 132/84 (BP Location: Left Arm, Patient Position: Sitting, Cuff Size: Normal)   Pulse 75   Temp 97.9 F (36.6 C) (Oral)   Ht 5\' 1"  (1.549 m)   Wt 149 lb (67.6 kg)   LMP 11/24/1982   SpO2 98%   BMI 28.15 kg/m   BP Readings from Last 3 Encounters:  10/08/18 132/84  10/06/18 (!) 150/77  07/09/18 120/80    Wt Readings from Last 3 Encounters:  10/08/18 149 lb (67.6 kg)  10/06/18 148 lb (67.1 kg)  07/09/18 149 lb (67.6 kg)    Physical Exam Constitutional:      General: She is not in acute distress.    Appearance: She is well-developed.  HENT:     Head: Normocephalic.     Right Ear: External ear normal.     Left Ear: External ear  normal.     Nose: Nose normal.  Eyes:     General:        Right eye: No discharge.        Left eye: No discharge.     Conjunctiva/sclera: Conjunctivae normal.     Pupils: Pupils are equal, round, and reactive to light.  Neck:     Musculoskeletal: Normal range of motion and neck supple.     Thyroid: No thyromegaly.     Vascular: No JVD.     Trachea: No tracheal deviation.  Cardiovascular:     Rate and Rhythm: Normal rate and regular rhythm.     Heart sounds: Normal heart sounds.  Pulmonary:     Effort: No respiratory distress.     Breath sounds: No stridor. No wheezing.  Abdominal:     General: Bowel sounds are normal. There is no distension.     Palpations: Abdomen is soft. There is no mass.     Tenderness: There is no  abdominal tenderness. There is no guarding or rebound.  Musculoskeletal:        General: No tenderness.  Lymphadenopathy:     Cervical: No cervical adenopathy.  Skin:    Findings: No erythema or rash.  Neurological:     Cranial Nerves: No cranial nerve deficit.     Motor: No abnormal muscle tone.     Coordination: Coordination normal.     Deep Tendon Reflexes: Reflexes normal.  Psychiatric:        Behavior: Behavior normal.        Thought Content: Thought content normal.        Judgment: Judgment normal.     Lab Results  Component Value Date   WBC 8.4 10/06/2018   HGB 12.2 10/06/2018   HCT 39.1 10/06/2018   PLT 254 10/06/2018   GLUCOSE 94 10/06/2018   CHOL 151 03/04/2018   TRIG 65.0 03/04/2018   HDL 72.50 03/04/2018   LDLCALC 66 03/04/2018   ALT 45 (H) 03/04/2018   AST 30 03/04/2018   NA 138 10/06/2018   K 3.4 (L) 10/06/2018   CL 105 10/06/2018   CREATININE 0.54 10/06/2018   BUN 14 10/06/2018   CO2 25 10/06/2018   TSH 1.94 11/21/2017   HGBA1C 6.0 02/20/2017    Dg Chest 2 View  Result Date: 10/06/2018 CLINICAL DATA:  Acute onset chest heaviness and headache for several hours. Hypertension. Nonsmoker. EXAM: CHEST - 2 VIEW COMPARISON:  07/19/2017 FINDINGS: The heart size and mediastinal contours are within normal limits. Both lungs are clear. The visualized skeletal structures are unremarkable. IMPRESSION: No active cardiopulmonary disease. Electronically Signed   By: Lucienne Capers M.D.   On: 10/06/2018 01:43   Ct Head Wo Contrast  Result Date: 10/06/2018 CLINICAL DATA:  Pt states that on and off today she felt she could not take a deep breath. Worsened tonight. Pt states that she has a neck traction machine that she uses. She laid down to use the machine and felt like something was sitting on her chest EXAM: CT HEAD WITHOUT CONTRAST TECHNIQUE: Contiguous axial images were obtained from the base of the skull through the vertex without intravenous contrast. COMPARISON:   None. FINDINGS: Brain: No acute territorial infarction, hemorrhage or intracranial mass. 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 Mild small vessel ischemic changes of the white matter. Mild atrophy. Nonenlarged ventricles. Vascular: No hyperdense vessels.  Carotid vascular calcification Skull: Normal. Negative for fracture or focal lesion. Sinuses/Orbits: No acute finding. Other:  None IMPRESSION: 1. No CT evidence for acute intracranial abnormality. 2. Atrophy and mild small vessel ischemic changes of the white matter. Chronic appearing lacunar infarct in the left basal ganglia Electronically Signed   By: Donavan Foil M.D.   On: 10/06/2018 01:42    Assessment & Plan:   There are no diagnoses linked to this encounter.   No orders of the defined types were placed in this encounter.    Follow-up: No follow-ups on file.  Walker Kehr, MD

## 2018-10-08 NOTE — Assessment & Plan Note (Signed)
Lipitor 

## 2018-10-08 NOTE — Assessment & Plan Note (Signed)
Losartan 

## 2018-10-08 NOTE — Assessment & Plan Note (Signed)
Dr Rolena Infante suggested a traction device

## 2018-10-08 NOTE — Assessment & Plan Note (Signed)
Clonazepam prn Reduce caffeine

## 2018-10-10 ENCOUNTER — Other Ambulatory Visit: Payer: Self-pay

## 2018-10-10 DIAGNOSIS — N952 Postmenopausal atrophic vaginitis: Secondary | ICD-10-CM

## 2018-10-10 NOTE — Telephone Encounter (Signed)
Medication refill request: estradiol 0.02% Last AEX:  07/09/18  Next AEX: 07/15/19 Last MMG (if hormonal medication request):11/05/17 Bi-rads 1 neg  Refill authorized: 1 tube 0 rf. Please advise.

## 2018-10-11 MED ORDER — NONFORMULARY OR COMPOUNDED ITEM: 0 refills | Status: DC

## 2018-10-29 DIAGNOSIS — G8918 Other acute postprocedural pain: Secondary | ICD-10-CM | POA: Diagnosis not present

## 2018-10-29 DIAGNOSIS — M2022 Hallux rigidus, left foot: Secondary | ICD-10-CM | POA: Diagnosis not present

## 2018-10-29 DIAGNOSIS — M7742 Metatarsalgia, left foot: Secondary | ICD-10-CM | POA: Diagnosis not present

## 2018-10-29 DIAGNOSIS — Z967 Presence of other bone and tendon implants: Secondary | ICD-10-CM | POA: Diagnosis not present

## 2018-11-06 ENCOUNTER — Ambulatory Visit: Payer: Medicare Other

## 2018-11-18 ENCOUNTER — Ambulatory Visit
Admission: RE | Admit: 2018-11-18 | Discharge: 2018-11-18 | Disposition: A | Discharge status: Home / Self Care | Payer: Medicare Other | Source: Ambulatory Visit | Attending: Internal Medicine | Admitting: Internal Medicine

## 2018-11-18 DIAGNOSIS — Z1231 Encounter for screening mammogram for malignant neoplasm of breast: Secondary | ICD-10-CM | POA: Diagnosis not present

## 2018-11-27 ENCOUNTER — Other Ambulatory Visit: Payer: Self-pay | Admitting: Adult Health

## 2018-12-11 DIAGNOSIS — Z5189 Encounter for other specified aftercare: Secondary | ICD-10-CM | POA: Diagnosis not present

## 2018-12-11 DIAGNOSIS — M2022 Hallux rigidus, left foot: Secondary | ICD-10-CM | POA: Diagnosis not present

## 2019-01-07 ENCOUNTER — Encounter: Payer: Self-pay | Admitting: Internal Medicine

## 2019-01-07 ENCOUNTER — Ambulatory Visit (INDEPENDENT_AMBULATORY_CARE_PROVIDER_SITE_OTHER): Payer: Medicare Other | Admitting: Internal Medicine

## 2019-01-07 DIAGNOSIS — I25119 Atherosclerotic heart disease of native coronary artery with unspecified angina pectoris: Secondary | ICD-10-CM | POA: Diagnosis not present

## 2019-01-07 DIAGNOSIS — F410 Panic disorder [episodic paroxysmal anxiety] without agoraphobia: Secondary | ICD-10-CM | POA: Diagnosis not present

## 2019-01-07 MED ORDER — CLONAZEPAM 0.25 MG PO TBDP: 0.2500 mg | ORAL_TABLET | Freq: Two times a day (BID) | ORAL | 1 refills | Status: DC | PRN

## 2019-01-07 NOTE — Assessment & Plan Note (Addendum)
3 attacks - rare Clonazepam prn Reduce caffeine Pt declined SSRIs  Potential benefits of a long term benzodiazepines  use as well as potential risks  and complications were explained to the patient and were aknowledged.

## 2019-01-07 NOTE — Progress Notes (Signed)
Virtual Visit via Telephone Note  I connected with Jennifer Davenport on 01/07/19 at  3:00 PM EDT by telephone and verified that I am speaking with the correct person using two identifiers.   I discussed the limitations, risks, security and privacy concerns of performing an evaluation and management service by telephone and the availability of in person appointments. I also discussed with the patient that there may be a patient responsible charge related to this service. The patient expressed understanding and agreed to proceed.   History of Present Illness:   This is a follow-up on panic attacks.  [name] had 3 attacks total.  During her last attack she took clonazepam 30 minutes after the onset about 20 minutes the attack was gone. Observations/Objective:  Jennifer Davenport is in no acute distress - she looks well Assessment and Plan:  See plan Follow Up Instructions:    I discussed the assessment and treatment plan with the patient. The patient was provided an opportunity to ask questions and all were answered. The patient agreed with the plan and demonstrated an understanding of the instructions.   The patient was advised to call back or seek an in-person evaluation if the symptoms worsen or if the condition fails to improve as anticipated.  I provided 15 minutes of non-face-to-face time during this encounter.   Jennifer Kehr, MD

## 2019-01-09 ENCOUNTER — Other Ambulatory Visit: Payer: Self-pay | Admitting: Internal Medicine

## 2019-01-09 MED ORDER — ESCITALOPRAM OXALATE 5 MG PO TABS: 5.0000 mg | ORAL_TABLET | Freq: Every day | ORAL | 5 refills | Status: DC

## 2019-01-13 DIAGNOSIS — M79672 Pain in left foot: Secondary | ICD-10-CM | POA: Diagnosis not present

## 2019-01-13 DIAGNOSIS — M7742 Metatarsalgia, left foot: Secondary | ICD-10-CM | POA: Diagnosis not present

## 2019-01-13 DIAGNOSIS — M2022 Hallux rigidus, left foot: Secondary | ICD-10-CM | POA: Diagnosis not present

## 2019-01-13 DIAGNOSIS — Z5189 Encounter for other specified aftercare: Secondary | ICD-10-CM | POA: Diagnosis not present

## 2019-02-10 DIAGNOSIS — Z09 Encounter for follow-up examination after completed treatment for conditions other than malignant neoplasm: Secondary | ICD-10-CM | POA: Diagnosis not present

## 2019-02-10 DIAGNOSIS — M7742 Metatarsalgia, left foot: Secondary | ICD-10-CM | POA: Diagnosis not present

## 2019-02-10 DIAGNOSIS — M79672 Pain in left foot: Secondary | ICD-10-CM | POA: Diagnosis not present

## 2019-02-10 DIAGNOSIS — M2022 Hallux rigidus, left foot: Secondary | ICD-10-CM | POA: Diagnosis not present

## 2019-02-28 DIAGNOSIS — L814 Other melanin hyperpigmentation: Secondary | ICD-10-CM | POA: Diagnosis not present

## 2019-02-28 DIAGNOSIS — D1801 Hemangioma of skin and subcutaneous tissue: Secondary | ICD-10-CM | POA: Diagnosis not present

## 2019-02-28 DIAGNOSIS — L570 Actinic keratosis: Secondary | ICD-10-CM | POA: Diagnosis not present

## 2019-02-28 DIAGNOSIS — L821 Other seborrheic keratosis: Secondary | ICD-10-CM | POA: Diagnosis not present

## 2019-02-28 DIAGNOSIS — L237 Allergic contact dermatitis due to plants, except food: Secondary | ICD-10-CM | POA: Diagnosis not present

## 2019-02-28 DIAGNOSIS — L719 Rosacea, unspecified: Secondary | ICD-10-CM | POA: Diagnosis not present

## 2019-03-02 ENCOUNTER — Other Ambulatory Visit: Payer: Self-pay | Admitting: Internal Medicine

## 2019-03-06 ENCOUNTER — Encounter: Payer: Self-pay | Admitting: Internal Medicine

## 2019-03-06 ENCOUNTER — Other Ambulatory Visit: Payer: Self-pay

## 2019-03-06 ENCOUNTER — Ambulatory Visit (INDEPENDENT_AMBULATORY_CARE_PROVIDER_SITE_OTHER): Payer: Medicare Other | Admitting: Internal Medicine

## 2019-03-06 VITALS — BP 136/86 | HR 68 | Temp 97.8°F | Ht 61.0 in

## 2019-03-06 DIAGNOSIS — I25119 Atherosclerotic heart disease of native coronary artery with unspecified angina pectoris: Secondary | ICD-10-CM | POA: Diagnosis not present

## 2019-03-06 DIAGNOSIS — E785 Hyperlipidemia, unspecified: Secondary | ICD-10-CM

## 2019-03-06 DIAGNOSIS — F410 Panic disorder [episodic paroxysmal anxiety] without agoraphobia: Secondary | ICD-10-CM | POA: Diagnosis not present

## 2019-03-06 DIAGNOSIS — I10 Essential (primary) hypertension: Secondary | ICD-10-CM

## 2019-03-06 MED ORDER — LOSARTAN POTASSIUM 50 MG PO TABS: ORAL_TABLET | ORAL | 3 refills | Status: DC

## 2019-03-06 MED ORDER — ATORVASTATIN CALCIUM 80 MG PO TABS: ORAL_TABLET | ORAL | 3 refills | Status: DC

## 2019-03-06 MED ORDER — PAROXETINE HCL 20 MG PO TABS: 20.0000 mg | ORAL_TABLET | Freq: Every day | ORAL | 5 refills | Status: DC

## 2019-03-06 NOTE — Assessment & Plan Note (Signed)
Dr Claiborne Billings Lipitor

## 2019-03-06 NOTE — Patient Instructions (Signed)
If you have medicare related insurance (such as traditional Medicare, Blue Cross Medicare, United [employer] Medicare, or similar), Please make an appointment at the scheduling desk with Jill, the Wellness Health Coach, for your Wellness visit in this office, which is a benefit with your insurance.  

## 2019-03-06 NOTE — Assessment & Plan Note (Signed)
Lipitor 

## 2019-03-06 NOTE — Progress Notes (Signed)
Subjective:  Patient ID: Jennifer Davenport, female    DOB: 1950/10/14  Age: 68 y.o. MRN: [id number]  CC: No chief complaint on file.   HPI Jennifer Davenport presents for panic attacks Not better w/Lexapro F/u dyslipidemia, HTN  Outpatient Medications Prior to Visit  Medication Sig Dispense Refill  . acetaminophen (TYLENOL) 500 MG tablet Take 500 mg by mouth every 6 (six) hours as needed.    [name] atorvastatin (LIPITOR) 80 MG tablet TAKE 1 TABLET AT 6PM. 90 tablet 0  . Azelaic Acid 15 % cream Apply topically 1 day or 1 dose. After skin is thoroughly washed and patted dry, gently but thoroughly massage a thin film of azelaic acid cream into the affected area twice daily, in the morning and evening.    [name] b complex vitamins tablet Take 1 tablet by mouth daily.    . Cholecalciferol (VITAMIN D-3) 1000 UNITS CAPS Take by mouth daily.    . clonazePAM (KLONOPIN) 0.25 MG disintegrating tablet Take 1 tablet (0.25 mg total) by mouth 2 (two) times daily as needed (anxiety attack). 30 tablet 1  . dicyclomine (BENTYL) 10 MG capsule TAKE 1 CAPSULE EVERY 6-8 HOURS AS NEEDED FOR CRAMPING AND SPASMS 90 capsule 1  . escitalopram (LEXAPRO) 5 MG tablet Take 1 tablet (5 mg total) by mouth daily. 30 tablet 5  . fluticasone (FLONASE) 50 MCG/ACT nasal spray Place 1-2 sprays into both nostrils daily.     [name] losartan (COZAAR) 50 MG tablet TAKE 1 TABLET EACH DAY. 90 tablet 3  . NONFORMULARY OR COMPOUNDED ITEM estradiol 0.02% in HRT replacement cream.  Apply 41ml vaginally twice weekly.  #30 grams 1 each 0  . Omega-3 Fatty Acids (FISH OIL) 1000 MG CAPS     . OVER THE COUNTER MEDICATION Take 800 mg by mouth 2 (two) times daily. Holy Basil    . triamcinolone cream (KENALOG) 0.1 %     . cetirizine (ZYRTEC) 10 MG tablet Take 1 tablet (10 mg total) by mouth daily. 100 tablet 3  . UNABLE TO FIND Betaine hydrochloride as needed     No facility-administered medications prior to visit.     ROS: Review of Systems   Constitutional: Negative for activity change, appetite change, chills, fatigue and unexpected weight change.  HENT: Negative for congestion, mouth sores and sinus pressure.   Eyes: Negative for visual disturbance.  Respiratory: Negative for cough and chest tightness.   Gastrointestinal: Negative for abdominal pain and nausea.  Genitourinary: Negative for difficulty urinating, frequency and vaginal pain.  Musculoskeletal: Negative for back pain and gait problem.  Skin: Negative for pallor and rash.  Neurological: Negative for dizziness, tremors, weakness, numbness and headaches.  Psychiatric/Behavioral: Negative for confusion and sleep disturbance. The patient is nervous/anxious.     Objective:  BP 136/86 (BP Location: Left Arm, Patient Position: Sitting, Cuff Size: Normal)   Pulse 68   Temp 97.8 F (36.6 C) (Oral)   Ht 5\' 1"  (1.549 m)   LMP 11/24/1982   SpO2 98%   BMI 28.15 kg/m   BP Readings from Last 3 Encounters:  03/06/19 136/86  10/08/18 132/84  10/06/18 (!) 150/77    Wt Readings from Last 3 Encounters:  10/08/18 149 lb (67.6 kg)  10/06/18 148 lb (67.1 kg)  07/09/18 149 lb (67.6 kg)    Physical Exam Constitutional:      General: She is not in acute distress.    Appearance: She is well-developed.  HENT:     Head:  Normocephalic.     Right Ear: External ear normal.     Left Ear: External ear normal.     Nose: Nose normal.  Eyes:     General:        Right eye: No discharge.        Left eye: No discharge.     Conjunctiva/sclera: Conjunctivae normal.     Pupils: Pupils are equal, round, and reactive to light.  Neck:     Musculoskeletal: Normal range of motion and neck supple.     Thyroid: No thyromegaly.     Vascular: No JVD.     Trachea: No tracheal deviation.  Cardiovascular:     Rate and Rhythm: Normal rate and regular rhythm.     Heart sounds: Normal heart sounds.  Pulmonary:     Effort: No respiratory distress.     Breath sounds: No stridor. No  wheezing.  Abdominal:     General: Bowel sounds are normal. There is no distension.     Palpations: Abdomen is soft. There is no mass.     Tenderness: There is no abdominal tenderness. There is no guarding or rebound.  Musculoskeletal:        General: No tenderness.  Lymphadenopathy:     Cervical: No cervical adenopathy.  Skin:    Findings: No erythema or rash.  Neurological:     Cranial Nerves: No cranial nerve deficit.     Motor: No abnormal muscle tone.     Coordination: Coordination normal.     Deep Tendon Reflexes: Reflexes normal.  Psychiatric:        Behavior: Behavior normal.        Thought Content: Thought content normal.        Judgment: Judgment normal.     Lab Results  Component Value Date   WBC 8.4 10/06/2018   HGB 12.2 10/06/2018   HCT 39.1 10/06/2018   PLT 254 10/06/2018   GLUCOSE 94 10/06/2018   CHOL 151 03/04/2018   TRIG 65.0 03/04/2018   HDL 72.50 03/04/2018   LDLCALC 66 03/04/2018   ALT 45 (H) 03/04/2018   AST 30 03/04/2018   NA 138 10/06/2018   K 3.4 (L) 10/06/2018   CL 105 10/06/2018   CREATININE 0.54 10/06/2018   BUN 14 10/06/2018   CO2 25 10/06/2018   TSH 1.94 11/21/2017   HGBA1C 6.0 02/20/2017    Mm 3d Screen Breast Bilateral  Result Date: 11/18/2018 CLINICAL DATA:  Screening. EXAM: DIGITAL SCREENING BILATERAL MAMMOGRAM WITH TOMO AND CAD COMPARISON:  Previous exam(s). ACR Breast Density Category c: The breast tissue is heterogeneously dense, which may obscure small masses. FINDINGS: There are no findings suspicious for malignancy. Images were processed with CAD. IMPRESSION: No mammographic evidence of malignancy. A result letter of this screening mammogram will be mailed directly to the patient. RECOMMENDATION: Screening mammogram in one year. (Code:SM-B-01Y) BI-RADS CATEGORY  1: Negative. Electronically Signed   By: Ammie Ferrier M.D.   On: 11/18/2018 09:47    Assessment & Plan:   There are no diagnoses linked to this encounter.    No orders of the defined types were placed in this encounter.    Follow-up: No follow-ups on file.  Walker Kehr, MD

## 2019-03-06 NOTE — Assessment & Plan Note (Signed)
Losartan 

## 2019-03-06 NOTE — Assessment & Plan Note (Signed)
Clonazepam prn rare use Reduce caffeine D/c Lexapro, start Paxil  Potential benefits of a long term benzodiazepines  use as well as potential risks  and complications were explained to the patient and were aknowledged. RTC 3 mo

## 2019-03-07 ENCOUNTER — Ambulatory Visit (INDEPENDENT_AMBULATORY_CARE_PROVIDER_SITE_OTHER): Payer: Medicare Other

## 2019-03-07 ENCOUNTER — Other Ambulatory Visit (INDEPENDENT_AMBULATORY_CARE_PROVIDER_SITE_OTHER): Payer: Medicare Other

## 2019-03-07 DIAGNOSIS — Z299 Encounter for prophylactic measures, unspecified: Secondary | ICD-10-CM

## 2019-03-07 DIAGNOSIS — I25119 Atherosclerotic heart disease of native coronary artery with unspecified angina pectoris: Secondary | ICD-10-CM

## 2019-03-07 DIAGNOSIS — E785 Hyperlipidemia, unspecified: Secondary | ICD-10-CM

## 2019-03-07 DIAGNOSIS — Z23 Encounter for immunization: Secondary | ICD-10-CM | POA: Diagnosis not present

## 2019-03-07 DIAGNOSIS — I10 Essential (primary) hypertension: Secondary | ICD-10-CM | POA: Diagnosis not present

## 2019-03-07 DIAGNOSIS — F410 Panic disorder [episodic paroxysmal anxiety] without agoraphobia: Secondary | ICD-10-CM | POA: Diagnosis not present

## 2019-03-07 LAB — CBC WITH DIFFERENTIAL/PLATELET
Basophils Absolute: 0 10*3/uL (ref 0.0–0.1)
Basophils Relative: 0.7 % (ref 0.0–3.0)
Eosinophils Absolute: 0.2 10*3/uL (ref 0.0–0.7)
Eosinophils Relative: 3.2 % (ref 0.0–5.0)
HCT: 38.5 % (ref 36.0–46.0)
Hemoglobin: 12.6 g/dL (ref 12.0–15.0)
Lymphocytes Relative: 43.7 % (ref 12.0–46.0)
Lymphs Abs: 2.2 10*3/uL (ref 0.7–4.0)
MCHC: 32.6 g/dL (ref 30.0–36.0)
MCV: 87.8 fl (ref 78.0–100.0)
Monocytes Absolute: 0.4 10*3/uL (ref 0.1–1.0)
Monocytes Relative: 7.7 % (ref 3.0–12.0)
Neutro Abs: 2.3 10*3/uL (ref 1.4–7.7)
Neutrophils Relative %: 44.7 % (ref 43.0–77.0)
Platelets: 211 10*3/uL (ref 150.0–400.0)
RBC: 4.38 Mil/uL (ref 3.87–5.11)
RDW: 13.4 % (ref 11.5–15.5)
WBC: 5.1 10*3/uL (ref 4.0–10.5)

## 2019-03-07 LAB — BASIC METABOLIC PANEL
BUN: 8 mg/dL (ref 6–23)
CO2: 25 mEq/L (ref 19–32)
Calcium: 9 mg/dL (ref 8.4–10.5)
Chloride: 105 mEq/L (ref 96–112)
Creatinine, Ser: 0.65 mg/dL (ref 0.40–1.20)
GFR: 90.99 mL/min (ref >60.00)
Glucose, Bld: 89 mg/dL (ref 70–99)
Potassium: 4.2 mEq/L (ref 3.5–5.1)
Sodium: 138 mEq/L (ref 135–145)

## 2019-03-07 LAB — HEPATIC FUNCTION PANEL
ALT: 22 U/L (ref 0–35)
AST: 20 U/L (ref 0–37)
Albumin: 4.2 g/dL (ref 3.5–5.2)
Alkaline Phosphatase: 50 U/L (ref 39–117)
Bilirubin, Direct: 0.1 mg/dL (ref 0.0–0.3)
Total Bilirubin: 0.5 mg/dL (ref 0.2–1.2)
Total Protein: 6.8 g/dL (ref 6.0–8.3)

## 2019-03-07 LAB — URINALYSIS, ROUTINE W REFLEX MICROSCOPIC
Bilirubin Urine: NEGATIVE
Hgb urine dipstick: NEGATIVE
Ketones, ur: NEGATIVE
Nitrite: NEGATIVE
RBC / HPF: NONE SEEN (ref 0–?)
Specific Gravity, Urine: 1.015 (ref 1.000–1.030)
Total Protein, Urine: NEGATIVE
Urine Glucose: NEGATIVE
Urobilinogen, UA: 0.2 (ref 0.0–1.0)
pH: 8.5 — AB (ref 5.0–8.0)

## 2019-03-07 LAB — LIPID PANEL
Cholesterol: 138 mg/dL (ref 0–200)
HDL: 72.2 mg/dL (ref >39.00)
LDL Cholesterol: 49 mg/dL (ref 0–99)
NonHDL: 65.53
Total CHOL/HDL Ratio: 2
Triglycerides: 83 mg/dL (ref 0.0–149.0)
VLDL: 16.6 mg/dL (ref 0.0–40.0)

## 2019-03-07 LAB — TSH: TSH: 1.78 u[IU]/mL (ref 0.35–4.50)

## 2019-03-10 ENCOUNTER — Ambulatory Visit: Payer: Self-pay

## 2019-03-10 ENCOUNTER — Encounter: Payer: Self-pay | Admitting: Internal Medicine

## 2019-03-10 NOTE — Telephone Encounter (Signed)
Patient is going to send picture on mychart to dr plotnikov, routing to dr plotnikov, fyi.....she doesn't tolerate benadryl by mouth very well

## 2019-03-10 NOTE — Telephone Encounter (Signed)
Incoming call from  Hiller who reports  She is having a possible injection reaction .  From her pneumonia injection from on Friday.  Patient denies fever.  Had a headache .  It has now resolved.  Patient reports swelling on the inside of the arm.  Has become red  , purple below the elbow.  Patient is able to move arm.  Elbow and forearm hot to touch.  Patient request a return  Phone   Call  To [phone] with advice.     2  Jennifer Caroli "Pat" Female, 68 y.o., 07-22-1951 MRN:  [id number] Phone:  [phone] Jerilynn Mages) PCP:  Cassandria Anger, MD Primary Cvg:  Medicare/Medicare Part A And B Next Appt With Internal Medicine 06/10/2019 at 9:10 AM Message from Scherrie Gerlach sent at 03/10/2019 8:59 AM EDT  Pt had PNA injection Friday. Now arm is swollen down to the wrist. Upper arm looks yellowish, like bruised and purplish red on inside of arm next to body. It is tender and hot.  Please call to advise   Call History   Type Contact  03/10/2019 08:56 AM EDT Phone (Incoming) [name], [name] (Self)  Phone: [phone] (H)  User: Scherrie Gerlach  Encounter Report  Patient Encounter Report

## 2019-03-10 NOTE — Telephone Encounter (Signed)
I have sent [name] a MyChart message Thanks

## 2019-03-21 ENCOUNTER — Other Ambulatory Visit: Payer: Self-pay | Admitting: Internal Medicine

## 2019-03-21 ENCOUNTER — Encounter: Payer: Self-pay | Admitting: Internal Medicine

## 2019-03-21 MED ORDER — DULOXETINE HCL 20 MG PO CPEP: 20.0000 mg | ORAL_CAPSULE | Freq: Every day | ORAL | 5 refills | Status: DC

## 2019-05-30 DIAGNOSIS — M21619 Bunion of unspecified foot: Secondary | ICD-10-CM | POA: Insufficient documentation

## 2019-05-30 DIAGNOSIS — M2021 Hallux rigidus, right foot: Secondary | ICD-10-CM | POA: Diagnosis not present

## 2019-05-30 DIAGNOSIS — M21612 Bunion of left foot: Secondary | ICD-10-CM | POA: Diagnosis not present

## 2019-05-30 DIAGNOSIS — M7741 Metatarsalgia, right foot: Secondary | ICD-10-CM | POA: Diagnosis not present

## 2019-05-30 DIAGNOSIS — Z978 Presence of other specified devices: Secondary | ICD-10-CM | POA: Diagnosis not present

## 2019-05-30 DIAGNOSIS — M21611 Bunion of right foot: Secondary | ICD-10-CM | POA: Diagnosis not present

## 2019-06-10 ENCOUNTER — Encounter: Payer: Self-pay | Admitting: Internal Medicine

## 2019-06-10 ENCOUNTER — Ambulatory Visit (INDEPENDENT_AMBULATORY_CARE_PROVIDER_SITE_OTHER): Payer: Medicare Other | Admitting: Internal Medicine

## 2019-06-10 ENCOUNTER — Other Ambulatory Visit: Payer: Self-pay

## 2019-06-10 DIAGNOSIS — F410 Panic disorder [episodic paroxysmal anxiety] without agoraphobia: Secondary | ICD-10-CM

## 2019-06-10 DIAGNOSIS — E785 Hyperlipidemia, unspecified: Secondary | ICD-10-CM | POA: Diagnosis not present

## 2019-06-10 DIAGNOSIS — I25119 Atherosclerotic heart disease of native coronary artery with unspecified angina pectoris: Secondary | ICD-10-CM

## 2019-06-10 DIAGNOSIS — I10 Essential (primary) hypertension: Secondary | ICD-10-CM

## 2019-06-10 MED ORDER — DULOXETINE HCL 20 MG PO CPEP: 20.0000 mg | ORAL_CAPSULE | Freq: Every day | ORAL | 3 refills | Status: DC

## 2019-06-10 NOTE — Patient Instructions (Signed)

## 2019-06-10 NOTE — Assessment & Plan Note (Signed)
Losartan 

## 2019-06-10 NOTE — Progress Notes (Signed)
Subjective:  Patient ID: Jennifer Davenport, female    DOB: 1951/08/25  Age: 68 y.o. MRN: [id number]  CC: No chief complaint on file.   HPI Jennifer Davenport presents for stress, anxiety Doing better Sister died of CVA -older sister  Outpatient Medications Prior to Visit  Medication Sig Dispense Refill  . acetaminophen (TYLENOL) 500 MG tablet Take 500 mg by mouth every 6 (six) hours as needed.    [name] atorvastatin (LIPITOR) 80 MG tablet TAKE 1 TABLET AT 6PM. 90 tablet 3  . Azelaic Acid 15 % cream Apply topically 1 day or 1 dose. After skin is thoroughly washed and patted dry, gently but thoroughly massage a thin film of azelaic acid cream into the affected area twice daily, in the morning and evening.    [name] b complex vitamins tablet Take 1 tablet by mouth daily.    . Cholecalciferol (VITAMIN D) 50 MCG (2000 UT) tablet     . clonazePAM (KLONOPIN) 0.25 MG disintegrating tablet Take 1 tablet (0.25 mg total) by mouth 2 (two) times daily as needed (anxiety attack). 30 tablet 1  . dicyclomine (BENTYL) 10 MG capsule TAKE 1 CAPSULE EVERY 6-8 HOURS AS NEEDED FOR CRAMPING AND SPASMS 90 capsule 1  . DULoxetine (CYMBALTA) 20 MG capsule Take 1 capsule (20 mg total) by mouth daily. 30 capsule 5  . fluticasone (FLONASE) 50 MCG/ACT nasal spray Place 1-2 sprays into both nostrils daily.     . Loratadine (CLARITIN) 10 MG CAPS     . losartan (COZAAR) 50 MG tablet TAKE 1 TABLET EACH DAY. 90 tablet 3  . Omega-3 Fatty Acids (FISH OIL) 1000 MG CAPS     . triamcinolone cream (KENALOG) 0.1 %     . Cholecalciferol (VITAMIN D-3) 1000 UNITS CAPS Take by mouth daily.    . NONFORMULARY OR COMPOUNDED ITEM estradiol 0.02% in HRT replacement cream.  Apply 37ml vaginally twice weekly.  #30 grams 1 each 0  . OVER THE COUNTER MEDICATION Take 800 mg by mouth 2 (two) times daily. Holy Basil     No facility-administered medications prior to visit.     ROS: Review of Systems  Objective:  BP (!) 144/86 (BP Location:  Left Arm, Patient Position: Sitting, Cuff Size: Normal)   Pulse 75   Temp 98.1 F (36.7 C) (Oral)   Ht 5\' 1"  (1.549 m)   Wt 155 lb (70.3 kg)   LMP 11/24/1982   SpO2 97%   BMI 29.29 kg/m   BP Readings from Last 3 Encounters:  06/10/19 (!) 144/86  03/06/19 136/86  10/08/18 132/84    Wt Readings from Last 3 Encounters:  06/10/19 155 lb (70.3 kg)  10/08/18 149 lb (67.6 kg)  10/06/18 148 lb (67.1 kg)    Physical Exam  Lab Results  Component Value Date   WBC 5.1 03/07/2019   HGB 12.6 03/07/2019   HCT 38.5 03/07/2019   PLT 211.0 03/07/2019   GLUCOSE 89 03/07/2019   CHOL 138 03/07/2019   TRIG 83.0 03/07/2019   HDL 72.20 03/07/2019   LDLCALC 49 03/07/2019   ALT 22 03/07/2019   AST 20 03/07/2019   NA 138 03/07/2019   K 4.2 03/07/2019   CL 105 03/07/2019   CREATININE 0.65 03/07/2019   BUN 8 03/07/2019   CO2 25 03/07/2019   TSH 1.78 03/07/2019   HGBA1C 6.0 02/20/2017    Mm 3d Screen Breast Bilateral  Result Date: 11/18/2018 CLINICAL DATA:  Screening. EXAM: DIGITAL SCREENING BILATERAL MAMMOGRAM  WITH TOMO AND CAD COMPARISON:  Previous exam(s). ACR Breast Density Category c: The breast tissue is heterogeneously dense, which may obscure small masses. FINDINGS: There are no findings suspicious for malignancy. Images were processed with CAD. IMPRESSION: No mammographic evidence of malignancy. A result letter of this screening mammogram will be mailed directly to the patient. RECOMMENDATION: Screening mammogram in one year. (Code:SM-B-01Y) BI-RADS CATEGORY  1: Negative. Electronically Signed   By: Ammie Ferrier M.D.   On: 11/18/2018 09:47    Assessment & Plan:   There are no diagnoses linked to this encounter.   No orders of the defined types were placed in this encounter.    Follow-up: No follow-ups on file.  Walker Kehr, MD

## 2019-06-10 NOTE — Assessment & Plan Note (Signed)
Lipitor 

## 2019-06-10 NOTE — Assessment & Plan Note (Signed)
On Lipitor 

## 2019-06-10 NOTE — Assessment & Plan Note (Signed)
Paxil Potential benefits of a long term benzodiazepines  use as well as potential risks  and complications were explained to the patient and were aknowledged.

## 2019-06-16 DIAGNOSIS — R600 Localized edema: Secondary | ICD-10-CM | POA: Insufficient documentation

## 2019-06-16 DIAGNOSIS — M25571 Pain in right ankle and joints of right foot: Secondary | ICD-10-CM | POA: Diagnosis not present

## 2019-06-27 ENCOUNTER — Ambulatory Visit: Payer: Medicare Other

## 2019-06-30 DIAGNOSIS — R600 Localized edema: Secondary | ICD-10-CM | POA: Diagnosis not present

## 2019-07-01 DIAGNOSIS — M7741 Metatarsalgia, right foot: Secondary | ICD-10-CM | POA: Diagnosis not present

## 2019-07-01 DIAGNOSIS — T8484XD Pain due to internal orthopedic prosthetic devices, implants and grafts, subsequent encounter: Secondary | ICD-10-CM | POA: Diagnosis not present

## 2019-07-01 DIAGNOSIS — Z967 Presence of other bone and tendon implants: Secondary | ICD-10-CM | POA: Diagnosis not present

## 2019-07-01 DIAGNOSIS — M2021 Hallux rigidus, right foot: Secondary | ICD-10-CM | POA: Diagnosis not present

## 2019-07-07 ENCOUNTER — Other Ambulatory Visit: Payer: Self-pay | Admitting: Internal Medicine

## 2019-07-12 ENCOUNTER — Ambulatory Visit: Payer: Medicare Other

## 2019-07-14 ENCOUNTER — Ambulatory Visit (INDEPENDENT_AMBULATORY_CARE_PROVIDER_SITE_OTHER): Payer: Medicare Other

## 2019-07-14 ENCOUNTER — Other Ambulatory Visit: Payer: Self-pay

## 2019-07-14 DIAGNOSIS — Z23 Encounter for immunization: Secondary | ICD-10-CM | POA: Diagnosis not present

## 2019-07-15 ENCOUNTER — Ambulatory Visit: Payer: Medicare Other | Admitting: Certified Nurse Midwife

## 2019-07-19 ENCOUNTER — Other Ambulatory Visit: Payer: Self-pay | Admitting: Internal Medicine

## 2019-08-11 DIAGNOSIS — M79671 Pain in right foot: Secondary | ICD-10-CM | POA: Diagnosis not present

## 2019-08-11 DIAGNOSIS — Z4789 Encounter for other orthopedic aftercare: Secondary | ICD-10-CM | POA: Diagnosis not present

## 2019-09-03 DIAGNOSIS — M25572 Pain in left ankle and joints of left foot: Secondary | ICD-10-CM | POA: Diagnosis not present

## 2019-09-03 DIAGNOSIS — M79671 Pain in right foot: Secondary | ICD-10-CM | POA: Diagnosis not present

## 2019-09-03 DIAGNOSIS — S8265XA Nondisplaced fracture of lateral malleolus of left fibula, initial encounter for closed fracture: Secondary | ICD-10-CM | POA: Diagnosis not present

## 2019-10-03 DIAGNOSIS — S8263XA Displaced fracture of lateral malleolus of unspecified fibula, initial encounter for closed fracture: Secondary | ICD-10-CM | POA: Insufficient documentation

## 2019-10-03 DIAGNOSIS — S8265XD Nondisplaced fracture of lateral malleolus of left fibula, subsequent encounter for closed fracture with routine healing: Secondary | ICD-10-CM | POA: Diagnosis not present

## 2019-10-03 DIAGNOSIS — M25572 Pain in left ankle and joints of left foot: Secondary | ICD-10-CM | POA: Diagnosis not present

## 2019-10-06 ENCOUNTER — Other Ambulatory Visit: Payer: Self-pay | Admitting: Internal Medicine

## 2019-10-06 DIAGNOSIS — Z1231 Encounter for screening mammogram for malignant neoplasm of breast: Secondary | ICD-10-CM

## 2019-10-06 DIAGNOSIS — Z9289 Personal history of other medical treatment: Secondary | ICD-10-CM

## 2019-10-12 ENCOUNTER — Other Ambulatory Visit: Payer: Self-pay | Admitting: Internal Medicine

## 2019-10-15 ENCOUNTER — Other Ambulatory Visit: Payer: Self-pay | Admitting: Internal Medicine

## 2019-10-20 IMAGING — MG DIGITAL SCREENING BILATERAL MAMMOGRAM WITH TOMO AND CAD
8 series · 8 of 24 positions shown · non-contrast
Comparison: Previous exam(s).

CLINICAL DATA: Screening.

EXAM:
DIGITAL SCREENING BILATERAL MAMMOGRAM WITH TOMO AND CAD

[R MLO synth-2D]
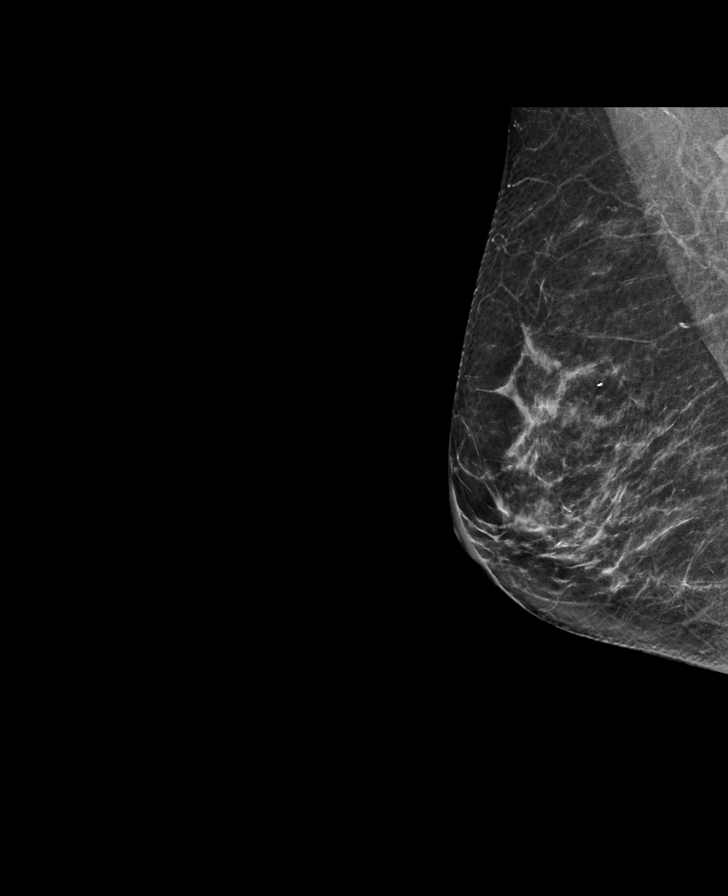

[L MLO synth-2D]
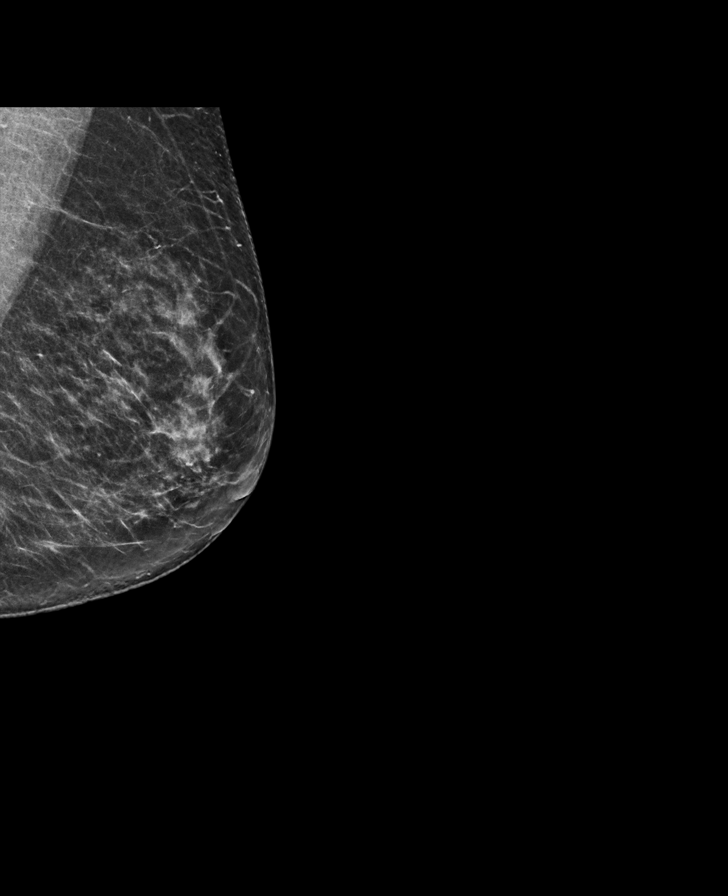

[L CC synth-2D]
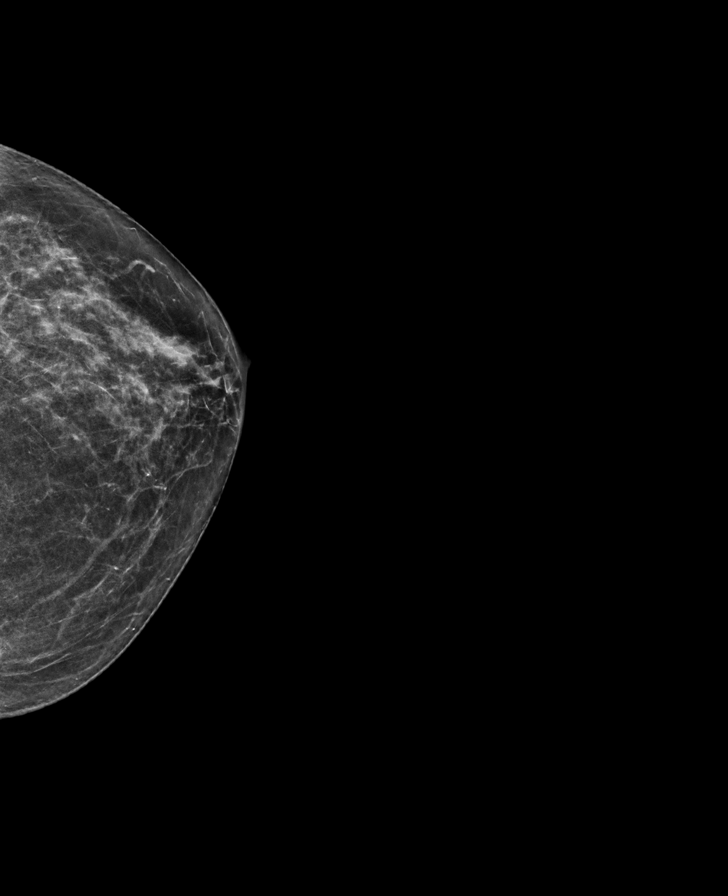

[R CC synth-2D]
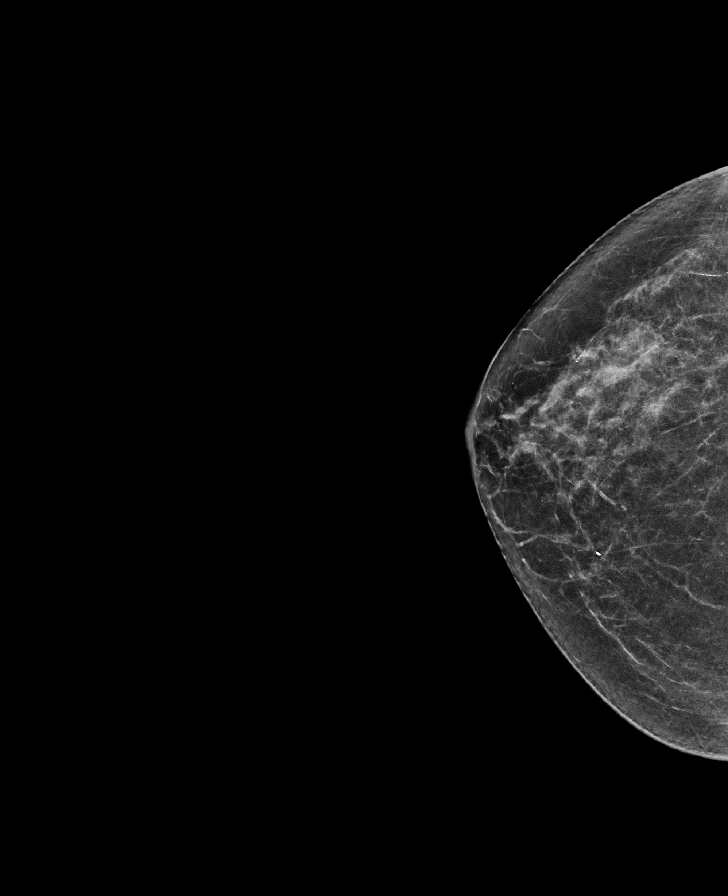

[R MLO tomo · tomo slice 35/68.0]
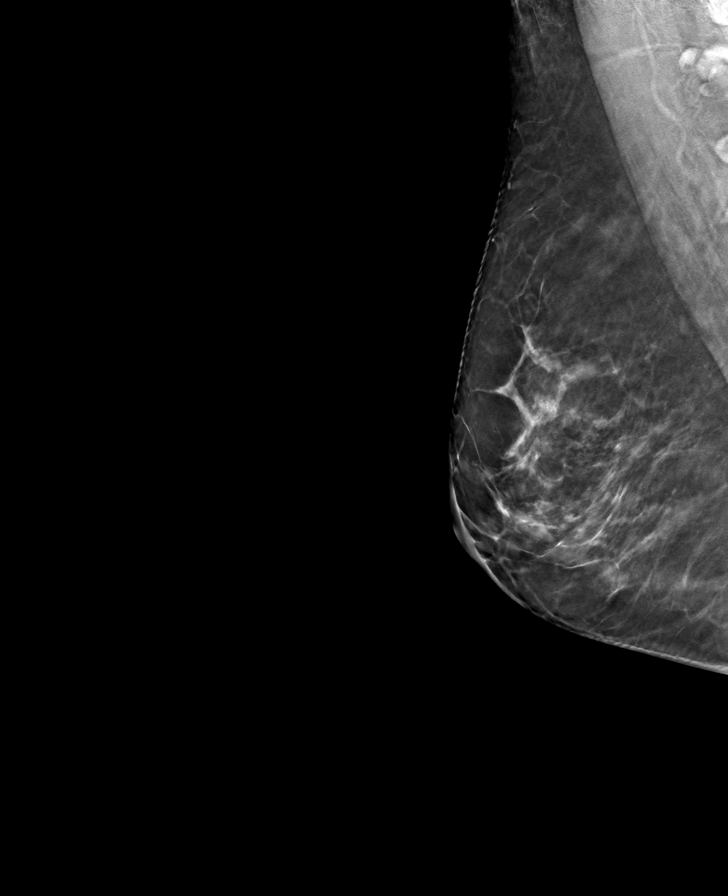

[L CC tomo · tomo slice 31/60.0]
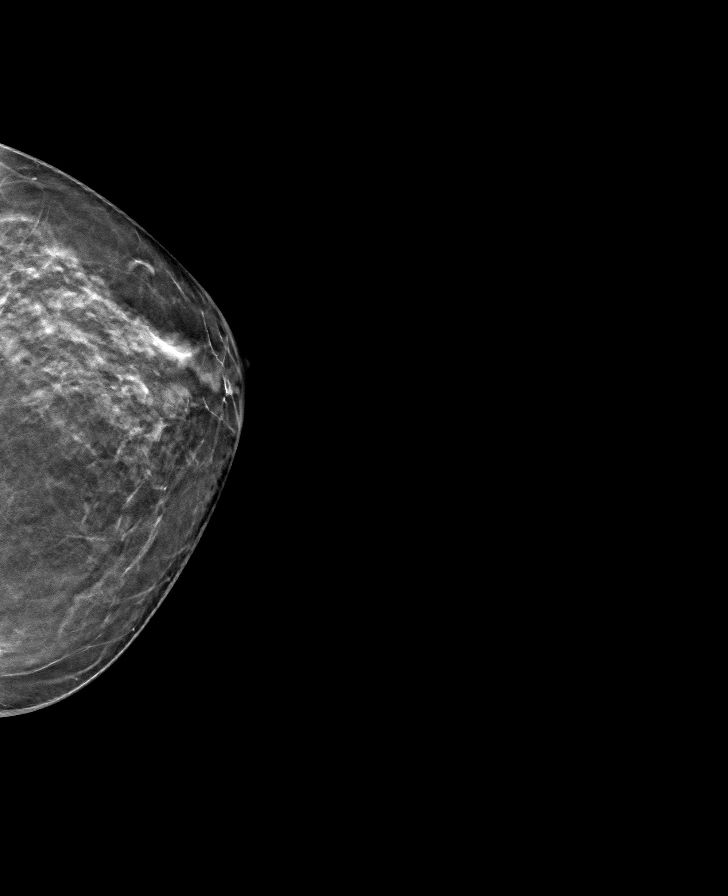

[L MLO tomo · tomo slice 34/67.0]
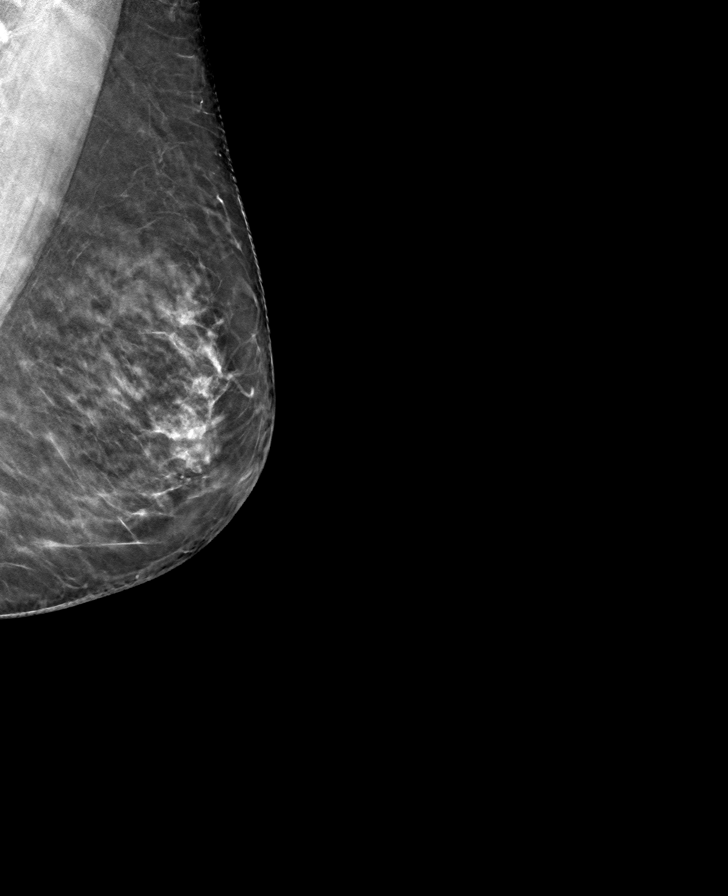

[R CC tomo · tomo slice 33/64.0]
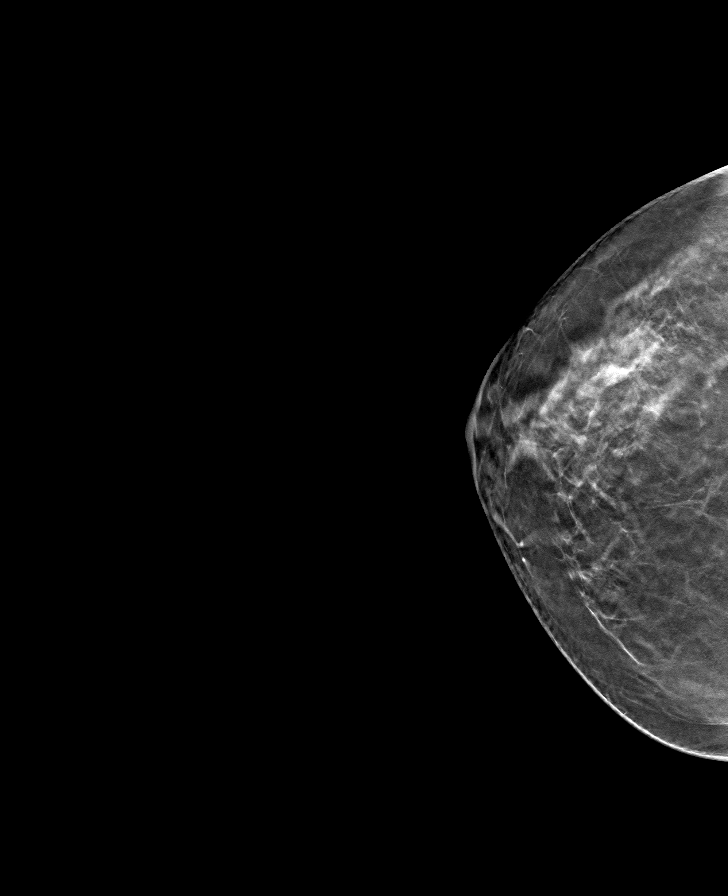

[8 of 24 positions shown; findings below may reference images not displayed]

ACR Breast Density Category c: The breast tissue is heterogeneously
dense, which may obscure small masses.
FINDINGS: There are no findings suspicious for malignancy. Images were
processed with CAD.
IMPRESSION: No mammographic evidence of malignancy. A result letter of this
screening mammogram will be mailed directly to the patient.

RECOMMENDATION:
Screening mammogram in one year. (Code:FT-[id number])

BI-RADS CATEGORY  1: Negative.

## 2019-10-31 DIAGNOSIS — S8265XD Nondisplaced fracture of lateral malleolus of left fibula, subsequent encounter for closed fracture with routine healing: Secondary | ICD-10-CM | POA: Diagnosis not present

## 2019-11-24 ENCOUNTER — Ambulatory Visit
Admission: RE | Admit: 2019-11-24 | Discharge: 2019-11-24 | Disposition: A | Discharge status: Home / Self Care | Payer: Medicare Other | Source: Ambulatory Visit | Attending: Internal Medicine | Admitting: Internal Medicine

## 2019-11-24 ENCOUNTER — Other Ambulatory Visit: Payer: Self-pay

## 2019-11-24 DIAGNOSIS — Z1231 Encounter for screening mammogram for malignant neoplasm of breast: Secondary | ICD-10-CM | POA: Diagnosis not present

## 2019-12-02 DIAGNOSIS — H2511 Age-related nuclear cataract, right eye: Secondary | ICD-10-CM | POA: Diagnosis not present

## 2019-12-02 DIAGNOSIS — H25811 Combined forms of age-related cataract, right eye: Secondary | ICD-10-CM | POA: Diagnosis not present

## 2019-12-02 DIAGNOSIS — H25041 Posterior subcapsular polar age-related cataract, right eye: Secondary | ICD-10-CM | POA: Diagnosis not present

## 2019-12-09 ENCOUNTER — Ambulatory Visit (INDEPENDENT_AMBULATORY_CARE_PROVIDER_SITE_OTHER): Payer: Medicare Other | Admitting: Internal Medicine

## 2019-12-09 ENCOUNTER — Other Ambulatory Visit: Payer: Self-pay

## 2019-12-09 ENCOUNTER — Encounter: Payer: Self-pay | Admitting: Internal Medicine

## 2019-12-09 VITALS — BP 146/88 | HR 81 | Temp 98.6°F | Ht 61.0 in | Wt 162.0 lb

## 2019-12-09 DIAGNOSIS — M79641 Pain in right hand: Secondary | ICD-10-CM

## 2019-12-09 DIAGNOSIS — E785 Hyperlipidemia, unspecified: Secondary | ICD-10-CM

## 2019-12-09 DIAGNOSIS — I10 Essential (primary) hypertension: Secondary | ICD-10-CM

## 2019-12-09 DIAGNOSIS — K9041 Non-celiac gluten sensitivity: Secondary | ICD-10-CM

## 2019-12-09 DIAGNOSIS — I25119 Atherosclerotic heart disease of native coronary artery with unspecified angina pectoris: Secondary | ICD-10-CM

## 2019-12-09 LAB — BASIC METABOLIC PANEL
BUN: 16 mg/dL (ref 6–23)
CO2: 28 mEq/L (ref 19–32)
Calcium: 9.5 mg/dL (ref 8.4–10.5)
Chloride: 103 mEq/L (ref 96–112)
Creatinine, Ser: 0.59 mg/dL (ref 0.40–1.20)
GFR: 101.52 mL/min (ref >60.00)
Glucose, Bld: 98 mg/dL (ref 70–99)
Potassium: 4.2 mEq/L (ref 3.5–5.1)
Sodium: 137 mEq/L (ref 135–145)

## 2019-12-09 MED ORDER — DICLOFENAC SODIUM 1 % EX GEL: 1.0000 "application " | Freq: Four times a day (QID) | CUTANEOUS | 3 refills | Status: DC

## 2019-12-09 MED ORDER — LOSARTAN POTASSIUM 100 MG PO TABS: 100.0000 mg | ORAL_TABLET | Freq: Every day | ORAL | 3 refills | Status: DC

## 2019-12-09 NOTE — Assessment & Plan Note (Signed)
R hand MCP#1-3 - painful - knitting a lot Voltaren gel Can't take po NSAIDs

## 2019-12-09 NOTE — Assessment & Plan Note (Signed)
lipitor

## 2019-12-09 NOTE — Assessment & Plan Note (Signed)
Worse Increase Losartan

## 2019-12-09 NOTE — Progress Notes (Signed)
Subjective:  Patient ID: Jennifer Davenport, female    DOB: December 13, 1950  Age: 69 y.o. MRN: [id number]  CC: No chief complaint on file.   HPI Jennifer Davenport presents for dyslipidemia, elevated BP, depression f/u H/o foot fx. BDS pending w/her GYN. C/o R hand tendonitis - R hand MCP#1-3 - painful - knitting a lot  Outpatient Medications Prior to Visit  Medication Sig Dispense Refill  . acetaminophen (TYLENOL) 500 MG tablet Take 500 mg by mouth every 6 (six) hours as needed.    [name] atorvastatin (LIPITOR) 80 MG tablet TAKE 1 TABLET AT 6PM. 90 tablet 3  . Azelaic Acid 15 % cream Apply topically 1 day or 1 dose. After skin is thoroughly washed and patted dry, gently but thoroughly massage a thin film of azelaic acid cream into the affected area twice daily, in the morning and evening.    [name] b complex vitamins tablet Take 1 tablet by mouth daily.    . Cholecalciferol (VITAMIN D) 50 MCG (2000 UT) tablet     . clonazePAM (KLONOPIN) 0.25 MG disintegrating tablet Take 1 tablet (0.25 mg total) by mouth 2 (two) times daily as needed (anxiety attack). 30 tablet 1  . dicyclomine (BENTYL) 10 MG capsule TAKE 1 CAPSULE EVERY 6-8 HOURS AS NEEDED FOR CRAMPING AND SPASMS 90 capsule 1  . DULoxetine (CYMBALTA) 20 MG capsule Take 1 capsule (20 mg total) by mouth daily. 90 capsule 3  . fluticasone (FLONASE) 50 MCG/ACT nasal spray Place 1-2 sprays into both nostrils daily.     . Loratadine (CLARITIN) 10 MG CAPS     . losartan (COZAAR) 50 MG tablet TAKE ONE TABLET BY MOUTH DAILY 90 tablet 3  . Omega-3 Fatty Acids (FISH OIL) 1000 MG CAPS     . triamcinolone cream (KENALOG) 0.1 %      No facility-administered medications prior to visit.    ROS: Review of Systems  Constitutional: Positive for unexpected weight change. Negative for activity change, appetite change, chills and fatigue.  HENT: Negative for congestion, mouth sores and sinus pressure.   Eyes: Negative for visual disturbance.  Respiratory:  Negative for cough and chest tightness.   Gastrointestinal: Negative for abdominal pain and nausea.  Genitourinary: Negative for difficulty urinating, frequency and vaginal pain.  Musculoskeletal: Negative for back pain and gait problem.  Skin: Negative for pallor and rash.  Neurological: Negative for dizziness, tremors, weakness, numbness and headaches.  Psychiatric/Behavioral: Negative for confusion, sleep disturbance and suicidal ideas.    Objective:  BP (!) 146/88 (BP Location: Left Arm, Patient Position: Sitting, Cuff Size: Normal)   Pulse 81   Temp 98.6 F (37 C) (Oral)   Ht 5\' 1"  (1.549 m)   Wt 162 lb (73.5 kg)   LMP 11/24/1982   SpO2 96%   BMI 30.61 kg/m   BP Readings from Last 3 Encounters:  12/09/19 (!) 146/88  06/10/19 (!) 144/86  03/06/19 136/86    Wt Readings from Last 3 Encounters:  12/09/19 162 lb (73.5 kg)  06/10/19 155 lb (70.3 kg)  10/08/18 149 lb (67.6 kg)    Physical Exam Constitutional:      General: She is not in acute distress.    Appearance: She is well-developed.  HENT:     Head: Normocephalic.     Right Ear: External ear normal.     Left Ear: External ear normal.     Nose: Nose normal.  Eyes:     General:  Right eye: No discharge.        Left eye: No discharge.     Conjunctiva/sclera: Conjunctivae normal.     Pupils: Pupils are equal, round, and reactive to light.  Neck:     Thyroid: No thyromegaly.     Vascular: No JVD.     Trachea: No tracheal deviation.  Cardiovascular:     Rate and Rhythm: Normal rate and regular rhythm.     Heart sounds: Normal heart sounds.  Pulmonary:     Effort: No respiratory distress.     Breath sounds: No stridor. No wheezing.  Abdominal:     General: Bowel sounds are normal. There is no distension.     Palpations: Abdomen is soft. There is no mass.     Tenderness: There is no abdominal tenderness. There is no guarding or rebound.  Musculoskeletal:        General: No tenderness.     Cervical  back: Normal range of motion and neck supple.  Lymphadenopathy:     Cervical: No cervical adenopathy.  Skin:    Findings: No erythema or rash.  Neurological:     Cranial Nerves: No cranial nerve deficit.     Motor: No abnormal muscle tone.     Coordination: Coordination normal.     Deep Tendon Reflexes: Reflexes normal.  Psychiatric:        Behavior: Behavior normal.        Thought Content: Thought content normal.        Judgment: Judgment normal.   R hand MCP#1-3 - painful  Lab Results  Component Value Date   WBC 5.1 03/07/2019   HGB 12.6 03/07/2019   HCT 38.5 03/07/2019   PLT 211.0 03/07/2019   GLUCOSE 89 03/07/2019   CHOL 138 03/07/2019   TRIG 83.0 03/07/2019   HDL 72.20 03/07/2019   LDLCALC 49 03/07/2019   ALT 22 03/07/2019   AST 20 03/07/2019   NA 138 03/07/2019   K 4.2 03/07/2019   CL 105 03/07/2019   CREATININE 0.65 03/07/2019   BUN 8 03/07/2019   CO2 25 03/07/2019   TSH 1.78 03/07/2019   HGBA1C 6.0 02/20/2017    MM 3D SCREEN BREAST BILATERAL  Result Date: 11/24/2019 CLINICAL DATA:  Screening. EXAM: DIGITAL SCREENING BILATERAL MAMMOGRAM WITH TOMO AND CAD COMPARISON:  Previous exam(s). ACR Breast Density Category c: The breast tissue is heterogeneously dense, which may obscure small masses. FINDINGS: There are no findings suspicious for malignancy. Images were processed with CAD. IMPRESSION: No mammographic evidence of malignancy. A result letter of this screening mammogram will be mailed directly to the patient. RECOMMENDATION: Screening mammogram in one year. (Code:SM-B-01Y) BI-RADS CATEGORY  1: Negative. Electronically Signed   By: Kristopher Oppenheim M.D.   On: 11/24/2019 16:18    Assessment & Plan:    Walker Kehr, MD

## 2019-12-09 NOTE — Assessment & Plan Note (Signed)
On Lipitor 

## 2019-12-09 NOTE — Assessment & Plan Note (Signed)
On gluten free, milk free and lectin free diet

## 2019-12-10 LAB — RHEUMATOID FACTOR: Rheumatoid fact SerPl-aCnc: <14 IU/mL (ref <14)

## 2019-12-16 ENCOUNTER — Encounter: Payer: Self-pay | Admitting: Certified Nurse Midwife

## 2020-01-06 DIAGNOSIS — H25042 Posterior subcapsular polar age-related cataract, left eye: Secondary | ICD-10-CM | POA: Diagnosis not present

## 2020-01-06 DIAGNOSIS — H25812 Combined forms of age-related cataract, left eye: Secondary | ICD-10-CM | POA: Diagnosis not present

## 2020-01-06 DIAGNOSIS — H2512 Age-related nuclear cataract, left eye: Secondary | ICD-10-CM | POA: Diagnosis not present

## 2020-01-30 ENCOUNTER — Other Ambulatory Visit: Payer: Self-pay

## 2020-01-30 ENCOUNTER — Encounter: Payer: Self-pay | Admitting: Internal Medicine

## 2020-01-30 ENCOUNTER — Ambulatory Visit (INDEPENDENT_AMBULATORY_CARE_PROVIDER_SITE_OTHER): Payer: Medicare Other | Admitting: Internal Medicine

## 2020-01-30 VITALS — BP 168/96 | HR 82 | Temp 98.7°F | Ht 61.0 in | Wt 165.0 lb

## 2020-01-30 DIAGNOSIS — I25119 Atherosclerotic heart disease of native coronary artery with unspecified angina pectoris: Secondary | ICD-10-CM

## 2020-01-30 DIAGNOSIS — R400 Somnolence: Secondary | ICD-10-CM

## 2020-01-30 DIAGNOSIS — I10 Essential (primary) hypertension: Secondary | ICD-10-CM

## 2020-01-30 DIAGNOSIS — J309 Allergic rhinitis, unspecified: Secondary | ICD-10-CM | POA: Diagnosis not present

## 2020-01-30 MED ORDER — AMLODIPINE BESYLATE 5 MG PO TABS: 5.0000 mg | ORAL_TABLET | Freq: Every day | ORAL | 3 refills | Status: DC

## 2020-01-30 MED ORDER — MUCINEX 600 MG PO TB12: 1200.0000 mg | ORAL_TABLET | Freq: Two times a day (BID) | ORAL | 0 refills | Status: DC | PRN

## 2020-01-30 MED ORDER — PREDNISONE 10 MG PO TABS: ORAL_TABLET | ORAL | 0 refills | Status: DC

## 2020-01-30 MED ORDER — METHYLPREDNISOLONE ACETATE 80 MG/ML IJ SUSP
80.0000 mg | Freq: Once | INTRAMUSCULAR | Status: AC
  Administered 2020-01-30: 80 mg via INTRAMUSCULAR

## 2020-01-30 NOTE — Progress Notes (Signed)
Subjective:    Patient ID: Jennifer Davenport, female    DOB: 05-29-1951, 69 y.o.   MRN: [id number]  HPI  Here to f/u elevated BP; c/o foggy, lethargic, fatigued for unclear reasons, just felt out of it.;  BP yesterday at home x 4 were severe about 180/105. Good med compliance.  Pt denies chest pain, increased sob or doe, wheezing, orthopnea, PND, increased LE swelling, palpitations, dizziness or syncope.  Pt denies new neurological symptoms such as new headache, or facial or extremity weakness or numbness   Pt denies polydipsia, polyuria.  Has gained wt with covid pandemic;  Wt Readings from Last 3 Encounters:  01/30/20 165 lb (74.8 kg)  12/09/19 162 lb (73.5 kg)  06/10/19 155 lb (70.3 kg)  plans to drive to [facility] for family and grandchildren tomorrow and a bit apprehensive.   Pt denies fever, wt loss, night sweats, loss of appetite, or other constitutional symptoms Denies urinary symptoms such as dysuria, frequency, urgency, flank pain, hematuria or n/v, fever, chills. Married, lives with husband, sleeps in same room. Gets up about 1 time for BR overnight. But no mention of husband about her snoring but he sleeps more soundly than her and not sure about her breathing.  Does not feel restored on waking up, but not trying to fall asleep during the day, but has forced herself up from closing her eye on the couch in the last few days.  Also has sinus issues with allergies not well controlled with coricidin and tylenol.  Had sleep study about 20 yrs ago neg Past Medical History:  Diagnosis Date  . Abnormal Pap smear of cervix   . Allergy   . Atrophic vaginitis   . Bulging disc 10/2012   CERVICAL  . Bursitis of hip   . Cataract    mild starting of   . Endometriosis   . GERD (gastroesophageal reflux disease)   . Hyperlipidemia   . Hypertension   . IBS (irritable bowel syndrome)    Past Surgical History:  Procedure Laterality Date  . ABDOMINAL HYSTERECTOMY  11/1982   AUB & endometriosis,  ovaries remain  . APPENDECTOMY  1/72  . BUNIONECTOMY Left 04/2004   left foot  . CATARACT EXTRACTION    . CERVIX LESION DESTRUCTION    . COLONOSCOPY    . CRYOTHERAPY     for abnormal pap smear  . eyelid surgery Bilateral 2018  . HYSTEROTOMY  5/72   fetal demise, also BTL  . LAPAROSCOPIC CHOLECYSTECTOMY  02/2005  . POLYPECTOMY    . TMJ ARTHROPLASTY  6/94   secondary to hockey injury 15 years earlier  . TUBAL LIGATION Bilateral 5/72  . UPPER GASTROINTESTINAL ENDOSCOPY      reports that she has never smoked. She has never used smokeless tobacco. She reports previous alcohol use. She reports that she does not use drugs. family history includes Breast cancer (age of onset: 106) in her mother; CVA in her mother; Colon polyps in her mother; Diverticulitis in her mother; Heart disease in her brother, maternal grandfather, maternal uncle, mother, and sister; Hypercholesterolemia in her mother; Hypertension in her brother, father, maternal grandfather, maternal uncle, mother, and sister; Lung cancer (age of onset: 24) in her brother; Osteoarthritis in her mother and sister. Allergies  Allergen Reactions  . Ceftriaxone Sodium     Anaphylaxis shock Can take Amoxicillin ok  . Biaxin [Clarithromycin]     Chest pain  . Sulfonamide Derivatives Nausea Only  . Aspirin  Cramps and diarrhea  . Benadryl [Diphenhydramine]     Not tolerating well  . Ciprofloxacin     myalgias  . Codeine   . Dairy Aid [Lactase]   . Gluten Meal   . Levaquin [Levofloxacin In D5w]     hives  . Paxil [Paroxetine Hcl]     Headache  . Pneumovax 23 [Pneumococcal Vac Polyvalent]     Rash   . Rocephin [Ceftriaxone Sodium In Dextrose]   . Simvastatin     Leg pains   Current Outpatient Medications on File Prior to Visit  Medication Sig Dispense Refill  . acetaminophen (TYLENOL) 500 MG tablet Take 500 mg by mouth every 6 (six) hours as needed.    [name] atorvastatin (LIPITOR) 80 MG tablet TAKE 1 TABLET AT 6PM. 90 tablet 3   . Azelaic Acid 15 % cream Apply topically 1 day or 1 dose. After skin is thoroughly washed and patted dry, gently but thoroughly massage a thin film of azelaic acid cream into the affected area twice daily, in the morning and evening.    [name] b complex vitamins tablet Take 1 tablet by mouth daily.    . Cholecalciferol (VITAMIN D) 50 MCG (2000 UT) tablet     . diclofenac Sodium (VOLTAREN) 1 % GEL Apply 1 application topically 4 (four) times daily. 100 g 3  . dicyclomine (BENTYL) 10 MG capsule TAKE 1 CAPSULE EVERY 6-8 HOURS AS NEEDED FOR CRAMPING AND SPASMS 90 capsule 1  . DULoxetine (CYMBALTA) 20 MG capsule Take 1 capsule (20 mg total) by mouth daily. 90 capsule 3  . fluticasone (FLONASE) 50 MCG/ACT nasal spray Place 1-2 sprays into both nostrils daily.     . Loratadine (CLARITIN) 10 MG CAPS     . losartan (COZAAR) 100 MG tablet Take 1 tablet (100 mg total) by mouth daily. 90 tablet 3  . moxifloxacin (VIGAMOX) 0.5 % ophthalmic solution     . Omega-3 Fatty Acids (FISH OIL) 1000 MG CAPS     . triamcinolone cream (KENALOG) 0.1 %      No current facility-administered medications on file prior to visit.   Review of Systems All otherwise neg per pt     Objective:   Physical Exam BP (!) 168/96 (BP Location: Left Arm, Patient Position: Sitting, Cuff Size: Small)   Pulse 82   Temp 98.7 F (37.1 C) (Oral)   Ht 5\' 1"  (1.549 m)   Wt 165 lb (74.8 kg)   LMP 11/24/1982   SpO2 97%   BMI 31.18 kg/m  VS noted,  Constitutional: Pt appears in NAD HENT: Head: NCAT.  Right Ear: External ear normal.  Left Ear: External ear normal.  Bilat tm's with mild erythema.  Max sinus areas non tender.  Pharynx with mild erythema, no exudate Eyes: . Pupils are equal, round, and reactive to light. Conjunctivae and EOM are normal Nose: without d/c or deformity Neck: Neck supple. Gross normal ROM Cardiovascular: Normal rate and regular rhythm.   Pulmonary/Chest: Effort normal and breath sounds without rales or  wheezing.  Abd:  Soft, NT, ND, + BS, no organomegaly Neurological: Pt is alert. At baseline orientation, motor grossly intact Skin: Skin is warm. No rashes, other new lesions, no LE edema Psychiatric: Pt behavior is normal without agitation  All otherwise neg per pt Lab Results  Component Value Date   WBC 5.1 03/07/2019   HGB 12.6 03/07/2019   HCT 38.5 03/07/2019   PLT 211.0 03/07/2019   GLUCOSE 98 12/09/2019  CHOL 138 03/07/2019   TRIG 83.0 03/07/2019   HDL 72.20 03/07/2019   LDLCALC 49 03/07/2019   ALT 22 03/07/2019   AST 20 03/07/2019   NA 137 12/09/2019   K 4.2 12/09/2019   CL 103 12/09/2019   CREATININE 0.59 12/09/2019   BUN 16 12/09/2019   CO2 28 12/09/2019   TSH 1.78 03/07/2019   HGBA1C 6.0 02/20/2017      Assessment & Plan:

## 2020-01-30 NOTE — Patient Instructions (Signed)
You had the steroid shot today  Please take all new medication as prescribed - the prednisone, and mucinex for congestion, and amlodipine 5 mg per day for blood pressure  Please continue all other medications as before, including the flonase  Please have the pharmacy call with any other refills you may need.  Please continue your efforts at being more active, low cholesterol diet, and weight control.  Please keep your appointments with your specialists as you may have planned  You will be contacted regarding the referral for: Pulmonary for possible sleep apnea  Please see Dr Alain Marion in 3 weeks for blood pressure, allergies and possible sleep apnea follow up

## 2020-01-31 ENCOUNTER — Encounter: Payer: Self-pay | Admitting: Internal Medicine

## 2020-01-31 DIAGNOSIS — R400 Somnolence: Secondary | ICD-10-CM | POA: Insufficient documentation

## 2020-01-31 NOTE — Assessment & Plan Note (Signed)
With wt gain, for refer pulmonary r/o osa

## 2020-01-31 NOTE — Assessment & Plan Note (Addendum)
Uncontrolled, add amlodipine 5 qd  I spent 31 minutes in preparing to see the patient by review of recent labs, imaging and procedures, obtaining and reviewing separately obtained history, communicating with the patient and family or caregiver, ordering medications, tests or procedures, and documenting clinical information in the EHR including the differential Dx, treatment, and any further evaluation and other management of htn, allergies, daytime somnolence

## 2020-01-31 NOTE — Assessment & Plan Note (Signed)
Uncontrolled, depomedrol im 80, predpac asd, cont flonase and claritin, mucinex

## 2020-02-19 DIAGNOSIS — H15102 Unspecified episcleritis, left eye: Secondary | ICD-10-CM | POA: Diagnosis not present

## 2020-02-27 ENCOUNTER — Other Ambulatory Visit: Payer: Self-pay

## 2020-02-27 ENCOUNTER — Encounter: Payer: Self-pay | Admitting: Pulmonary Disease

## 2020-02-27 ENCOUNTER — Ambulatory Visit (INDEPENDENT_AMBULATORY_CARE_PROVIDER_SITE_OTHER): Payer: Medicare Other | Admitting: Pulmonary Disease

## 2020-02-27 VITALS — BP 120/80 | HR 91 | Temp 98.0°F | Ht 60.0 in | Wt 163.8 lb

## 2020-02-27 DIAGNOSIS — R0683 Snoring: Secondary | ICD-10-CM

## 2020-02-27 DIAGNOSIS — I25119 Atherosclerotic heart disease of native coronary artery with unspecified angina pectoris: Secondary | ICD-10-CM

## 2020-02-27 NOTE — Progress Notes (Signed)
[facility]  Chief Complaint  Patient presents with  . Follow-up    sleep consult    Constitutional:  BP 120/80 (BP Location: Left Arm, Cuff Size: Normal)   Pulse 91   Temp 98 F (36.7 C) (Oral)   Ht 5' (1.524 m)   Wt 163 lb 12.8 oz (74.3 kg)   LMP 11/24/1982   SpO2 97%   BMI 31.99 kg/m   Past Medical History:  Allergies, Cataract, Endometriosis, GERD, HLD, HTN, IBS  Summary:  Jennifer Davenport is a 69 y.o. female with snoring.  Subjective:  She recently had an episode of elevated blood pressure and confusion.  She was seen by primary care.  Noted to have snoring.  Her husband reports that her breathing gets shallow at night.  She has trouble falling asleep and staying asleep.  Has to drink 2 to 3 cups of coffee in the morning.  She has been getting cramps in her legs more frequently at night.  She goes to sleep at 10 pm.  She falls asleep in 30 to 60 minutes.  She wakes up 1 or 2 times to use the bathroom.  She gets out of bed at 630 am.  She feels tired in the morning.  She denies morning headache.  She does not use anything to help her fall sleep.  She denies sleep walking, sleep talking, bruxism, or nightmares.  There is no history of restless legs.  She denies sleep hallucinations, sleep paralysis, or cataplexy.  The Epworth score is 0 out of 24.   Physical Exam:   Appearance - well kempt  ENMT - no sinus tenderness, no nasal discharge, no oral exudate, Mallampati 4, low laying soft palate  Respiratory - no wheeze, or rales  CV - regular rate and rhythm, no murmurs  GI - soft, non tender  Lymph - no adenopathy noted in neck  Ext - no edema  Skin - no rashes  Neuro - normal strength, oriented x 3  Psych - normal mood and affect  Discussion:  She has snoring, sleep disruption, apnea, and daytime sleepiness.  She has history of hypertension on several medications.  I am concerned she could have obstructive sleep  apnea.  Assessment/Plan:   Snoring with excessive daytime sleepiness. - will need to arrange for a home sleep study  Obesity. - discussed how weight can impact sleep and risk for sleep disordered breathing - discussed options to assist with weight loss: combination of diet modification, cardiovascular and strength training exercises  Cardiovascular risk. - had an extensive discussion regarding the adverse health consequences related to untreated sleep disordered breathing - specifically discussed the risks for hypertension, coronary artery disease, cardiac dysrhythmias, cerebrovascular disease, and diabetes - lifestyle modification discussed  Safe driving practices. - discussed how sleep disruption can increase risk of accidents, particularly when driving - safe driving practices were discussed  Therapies for obstructive sleep apnea. - if the sleep study shows significant sleep apnea, then various therapies for treatment were reviewed: CPAP, oral appliance, and surgical interventions   A total of 32 minutes addressing patient care on the day of the visit.  Follow up:  Patient Instructions  Will arrange for home sleep study Will call to arrange for follow up after sleep study reviewed   Signature:  Chesley Mires, MD [place] Pager: [phone] 02/27/2020, 1:57 PM  Flow Sheet    Sleep tests:    Cardiac tests:  Echo 12/20/17 >> EF  55 to 60%, mild LVH, mild MR  Medications:   Allergies as of 02/27/2020      Reactions   Ceftriaxone Sodium    Anaphylaxis shock Can take Amoxicillin ok   Biaxin [clarithromycin]    Chest pain   Sulfonamide Derivatives Nausea Only   Aspirin    Cramps and diarrhea   Benadryl [diphenhydramine]    Not tolerating well   Ciprofloxacin    myalgias   Codeine    Dairy Aid [lactase]    Gluten Meal    Levaquin [levofloxacin In D5w]    hives   Paxil [paroxetine Hcl]    Headache   Pneumovax 23 [pneumococcal Vac  Polyvalent]    Rash   Rocephin [ceftriaxone Sodium In Dextrose]    Simvastatin    Leg pains      Medication List       Accurate as of February 27, 2020  1:57 PM. If you have any questions, ask your nurse or doctor.        STOP taking these medications   predniSONE 10 MG tablet Commonly known as: DELTASONE Stopped by: Chesley Mires, MD     TAKE these medications   acetaminophen 500 MG tablet Commonly known as: TYLENOL Take 500 mg by mouth every 6 (six) hours as needed.   amLODipine 5 MG tablet Commonly known as: NORVASC Take 1 tablet (5 mg total) by mouth daily.   atorvastatin 80 MG tablet Commonly known as: LIPITOR TAKE 1 TABLET AT 6PM.   Azelaic Acid 15 % cream Apply topically 1 day or 1 dose. After skin is thoroughly washed and patted dry, gently but thoroughly massage a thin film of azelaic acid cream into the affected area twice daily, in the morning and evening.   b complex vitamins tablet Take 1 tablet by mouth daily.   Claritin 10 MG Caps Generic drug: Loratadine   diclofenac Sodium 1 % Gel Commonly known as: Voltaren Apply 1 application topically 4 (four) times daily.   dicyclomine 10 MG capsule Commonly known as: BENTYL TAKE 1 CAPSULE EVERY 6-8 HOURS AS NEEDED FOR CRAMPING AND SPASMS   DULoxetine 20 MG capsule Commonly known as: Cymbalta Take 1 capsule (20 mg total) by mouth daily.   fluticasone 50 MCG/ACT nasal spray Commonly known as: FLONASE Place 1-2 sprays into both nostrils daily.   losartan 100 MG tablet Commonly known as: COZAAR Take 1 tablet (100 mg total) by mouth daily.   Mucinex 600 MG 12 hr tablet Generic drug: guaiFENesin Take 2 tablets (1,200 mg total) by mouth 2 (two) times daily as needed.   omeprazole 10 MG capsule Commonly known as: PRILOSEC   prednisoLONE acetate 1 % ophthalmic suspension Commonly known as: PRED FORTE   triamcinolone cream 0.1 % Commonly known as: KENALOG   Vitamin D 50 MCG (2000 UT) tablet        Past Surgical History:  She  has a past surgical history that includes Appendectomy (1/72); TMJ Arthroplasty (6/94); Bunionectomy (Left, 04/2004); Tubal ligation (Bilateral, 5/72); Laparoscopic cholecystectomy (02/2005); Hysterotomy (5/72); Abdominal hysterectomy (11/1982); Cervix lesion destruction; Cryotherapy; eyelid surgery (Bilateral, 2018); Colonoscopy; Polypectomy; Upper gastrointestinal endoscopy; and Cataract extraction.  Family History:  Her family history includes Breast cancer (age of onset: 61) in her mother; CVA in her mother; Colon polyps in her mother; Diverticulitis in her mother; Heart disease in her brother, maternal grandfather, maternal uncle, mother, and sister; Hypercholesterolemia in her mother; Hypertension in her brother, father, maternal grandfather, maternal uncle, mother, and sister;  Lung cancer (age of onset: 41) in her brother; Osteoarthritis in her mother and sister.  Social History:  She  reports that she has never smoked. She has never used smokeless tobacco. She reports previous alcohol use. She reports that she does not use drugs.

## 2020-02-27 NOTE — Patient Instructions (Signed)
Will arrange for home sleep study Will call to arrange for follow up after sleep study reviewed  

## 2020-03-02 ENCOUNTER — Other Ambulatory Visit: Payer: Self-pay | Admitting: Internal Medicine

## 2020-03-02 DIAGNOSIS — L821 Other seborrheic keratosis: Secondary | ICD-10-CM | POA: Diagnosis not present

## 2020-03-02 DIAGNOSIS — D485 Neoplasm of uncertain behavior of skin: Secondary | ICD-10-CM | POA: Diagnosis not present

## 2020-03-02 DIAGNOSIS — L578 Other skin changes due to chronic exposure to nonionizing radiation: Secondary | ICD-10-CM | POA: Diagnosis not present

## 2020-03-02 DIAGNOSIS — L920 Granuloma annulare: Secondary | ICD-10-CM | POA: Diagnosis not present

## 2020-03-02 DIAGNOSIS — L719 Rosacea, unspecified: Secondary | ICD-10-CM | POA: Diagnosis not present

## 2020-03-02 DIAGNOSIS — D1801 Hemangioma of skin and subcutaneous tissue: Secondary | ICD-10-CM | POA: Diagnosis not present

## 2020-03-02 DIAGNOSIS — L570 Actinic keratosis: Secondary | ICD-10-CM | POA: Diagnosis not present

## 2020-03-02 DIAGNOSIS — L814 Other melanin hyperpigmentation: Secondary | ICD-10-CM | POA: Diagnosis not present

## 2020-03-10 ENCOUNTER — Encounter: Payer: Self-pay | Admitting: Internal Medicine

## 2020-03-10 ENCOUNTER — Ambulatory Visit (INDEPENDENT_AMBULATORY_CARE_PROVIDER_SITE_OTHER): Payer: Medicare Other | Admitting: Internal Medicine

## 2020-03-10 ENCOUNTER — Other Ambulatory Visit: Payer: Self-pay

## 2020-03-10 VITALS — BP 176/94 | HR 78 | Temp 98.3°F | Ht 60.0 in | Wt 165.0 lb

## 2020-03-10 DIAGNOSIS — F410 Panic disorder [episodic paroxysmal anxiety] without agoraphobia: Secondary | ICD-10-CM | POA: Diagnosis not present

## 2020-03-10 DIAGNOSIS — I10 Essential (primary) hypertension: Secondary | ICD-10-CM

## 2020-03-10 DIAGNOSIS — I25119 Atherosclerotic heart disease of native coronary artery with unspecified angina pectoris: Secondary | ICD-10-CM | POA: Diagnosis not present

## 2020-03-10 DIAGNOSIS — E785 Hyperlipidemia, unspecified: Secondary | ICD-10-CM | POA: Diagnosis not present

## 2020-03-10 LAB — LIPID PANEL
Cholesterol: 155 mg/dL (ref 0–200)
HDL: 74.8 mg/dL (ref >39.00)
LDL Cholesterol: 55 mg/dL (ref 0–99)
NonHDL: 80.66
Total CHOL/HDL Ratio: 2
Triglycerides: 127 mg/dL (ref 0.0–149.0)
VLDL: 25.4 mg/dL (ref 0.0–40.0)

## 2020-03-10 LAB — HEPATIC FUNCTION PANEL
ALT: 24 U/L (ref 0–35)
AST: 21 U/L (ref 0–37)
Albumin: 4.4 g/dL (ref 3.5–5.2)
Alkaline Phosphatase: 61 U/L (ref 39–117)
Bilirubin, Direct: 0.1 mg/dL (ref 0.0–0.3)
Total Bilirubin: 0.5 mg/dL (ref 0.2–1.2)
Total Protein: 6.8 g/dL (ref 6.0–8.3)

## 2020-03-10 LAB — BASIC METABOLIC PANEL
BUN: 11 mg/dL (ref 6–23)
CO2: 28 mEq/L (ref 19–32)
Calcium: 9.3 mg/dL (ref 8.4–10.5)
Chloride: 102 mEq/L (ref 96–112)
Creatinine, Ser: 0.61 mg/dL (ref 0.40–1.20)
GFR: 97.61 mL/min (ref >60.00)
Glucose, Bld: 92 mg/dL (ref 70–99)
Potassium: 4 mEq/L (ref 3.5–5.1)
Sodium: 137 mEq/L (ref 135–145)

## 2020-03-10 LAB — TSH: TSH: 1.48 u[IU]/mL (ref 0.35–4.50)

## 2020-03-10 MED ORDER — LORAZEPAM 1 MG PO TABS: 0.5000 mg | ORAL_TABLET | Freq: Two times a day (BID) | ORAL | 1 refills | Status: DC | PRN

## 2020-03-10 MED ORDER — ATORVASTATIN CALCIUM 80 MG PO TABS: ORAL_TABLET | ORAL | 3 refills | Status: DC

## 2020-03-10 MED ORDER — LOSARTAN POTASSIUM 100 MG PO TABS: 100.0000 mg | ORAL_TABLET | Freq: Every day | ORAL | 3 refills | Status: DC

## 2020-03-10 MED ORDER — AMLODIPINE BESYLATE 5 MG PO TABS: 5.0000 mg | ORAL_TABLET | Freq: Every day | ORAL | 3 refills | Status: DC

## 2020-03-10 MED ORDER — DICYCLOMINE HCL 10 MG PO CAPS: ORAL_CAPSULE | ORAL | 1 refills | Status: DC

## 2020-03-10 MED ORDER — DULOXETINE HCL 20 MG PO CPEP: 20.0000 mg | ORAL_CAPSULE | Freq: Every day | ORAL | 3 refills | Status: DC

## 2020-03-10 NOTE — Assessment & Plan Note (Addendum)
anxiety attacks w/SOB - seldom Lorazepam prn  Potential benefits of a long term benzodiazepines  use as well as potential risks  and complications were explained to the patient and were aknowledged.

## 2020-03-10 NOTE — Assessment & Plan Note (Signed)
BP nl at home Amlodipine, Losartan

## 2020-03-10 NOTE — Progress Notes (Signed)
Subjective:  Patient ID: Jennifer Davenport, female    DOB: [birth year]  Age: 69 y.o. MRN: [id number]  CC: No chief complaint on file.   HPI Jennifer Davenport presents for HTN, depression, GERD  F/u. C/o anxiety attacks w/SOB - seldom BP nl at home.   Outpatient Medications Prior to Visit  Medication Sig Dispense Refill  . acetaminophen (TYLENOL) 500 MG tablet Take 500 mg by mouth every 6 (six) hours as needed.    [name] amLODipine (NORVASC) 5 MG tablet Take 1 tablet (5 mg total) by mouth daily. 90 tablet 3  . atorvastatin (LIPITOR) 80 MG tablet TAKE ONE TABLET BY MOUTH DAILY AT 6PM 90 tablet 1  . Azelaic Acid 15 % cream Apply topically 1 day or 1 dose. After skin is thoroughly washed and patted dry, gently but thoroughly massage a thin film of azelaic acid cream into the affected area twice daily, in the morning and evening.    [name] b complex vitamins tablet Take 1 tablet by mouth daily.    . Cholecalciferol (VITAMIN D) 50 MCG (2000 UT) tablet     . diclofenac Sodium (VOLTAREN) 1 % GEL Apply 1 application topically 4 (four) times daily. 100 g 3  . dicyclomine (BENTYL) 10 MG capsule TAKE 1 CAPSULE EVERY 6-8 HOURS AS NEEDED FOR CRAMPING AND SPASMS 90 capsule 1  . DULoxetine (CYMBALTA) 20 MG capsule Take 1 capsule (20 mg total) by mouth daily. 90 capsule 3  . fluticasone (FLONASE) 50 MCG/ACT nasal spray Place 1-2 sprays into both nostrils daily.     [name] guaiFENesin (MUCINEX) 600 MG 12 hr tablet Take 2 tablets (1,200 mg total) by mouth 2 (two) times daily as needed. 60 tablet 0  . Loratadine (CLARITIN) 10 MG CAPS     . losartan (COZAAR) 100 MG tablet Take 1 tablet (100 mg total) by mouth daily. 90 tablet 3  . omeprazole (PRILOSEC) 10 MG capsule     . triamcinolone cream (KENALOG) 0.1 %     . prednisoLONE acetate (PRED FORTE) 1 % ophthalmic suspension      No facility-administered medications prior to visit.    ROS: Review of Systems  Constitutional: Negative for activity change, appetite  change, chills, fatigue and unexpected weight change.  HENT: Negative for congestion, mouth sores and sinus pressure.   Eyes: Negative for visual disturbance.  Respiratory: Negative for cough and chest tightness.   Gastrointestinal: Negative for abdominal pain and nausea.  Genitourinary: Negative for difficulty urinating, frequency and vaginal pain.  Musculoskeletal: Negative for back pain and gait problem.  Skin: Negative for pallor and rash.  Neurological: Negative for dizziness, tremors, weakness, numbness and headaches.  Psychiatric/Behavioral: Negative for confusion and sleep disturbance. The patient is nervous/anxious.     Objective:  BP (!) 176/94 (BP Location: Right Arm, Patient Position: Sitting, Cuff Size: Normal)   Pulse 78   Temp 98.3 F (36.8 C) (Oral)   Ht 5' (1.524 m)   Wt 165 lb (74.8 kg)   LMP 11/24/1982   SpO2 97%   BMI 32.22 kg/m   BP Readings from Last 3 Encounters:  03/10/20 (!) 176/94  02/27/20 120/80  01/30/20 (!) 168/96    Wt Readings from Last 3 Encounters:  03/10/20 165 lb (74.8 kg)  02/27/20 163 lb 12.8 oz (74.3 kg)  01/30/20 165 lb (74.8 kg)    Physical Exam Constitutional:      General: She is not in acute distress.    Appearance: She is well-developed.  She is obese.  HENT:     Head: Normocephalic.     Right Ear: External ear normal.     Left Ear: External ear normal.     Nose: Nose normal.  Eyes:     General:        Right eye: No discharge.        Left eye: No discharge.     Conjunctiva/sclera: Conjunctivae normal.     Pupils: Pupils are equal, round, and reactive to light.  Neck:     Thyroid: No thyromegaly.     Vascular: No JVD.     Trachea: No tracheal deviation.  Cardiovascular:     Rate and Rhythm: Normal rate and regular rhythm.     Heart sounds: Normal heart sounds.  Pulmonary:     Effort: No respiratory distress.     Breath sounds: No stridor. No wheezing.  Abdominal:     General: Bowel sounds are normal. There is no  distension.     Palpations: Abdomen is soft. There is no mass.     Tenderness: There is no abdominal tenderness. There is no guarding or rebound.  Musculoskeletal:        General: No tenderness.     Cervical back: Normal range of motion and neck supple.  Lymphadenopathy:     Cervical: No cervical adenopathy.  Skin:    Findings: No erythema or rash.  Neurological:     Cranial Nerves: No cranial nerve deficit.     Motor: No abnormal muscle tone.     Coordination: Coordination normal.     Deep Tendon Reflexes: Reflexes normal.  Psychiatric:        Behavior: Behavior normal.        Thought Content: Thought content normal.        Judgment: Judgment normal.     Lab Results  Component Value Date   WBC 5.1 03/07/2019   HGB 12.6 03/07/2019   HCT 38.5 03/07/2019   PLT 211.0 03/07/2019   GLUCOSE 98 12/09/2019   CHOL 138 03/07/2019   TRIG 83.0 03/07/2019   HDL 72.20 03/07/2019   LDLCALC 49 03/07/2019   ALT 22 03/07/2019   AST 20 03/07/2019   NA 137 12/09/2019   K 4.2 12/09/2019   CL 103 12/09/2019   CREATININE 0.59 12/09/2019   BUN 16 12/09/2019   CO2 28 12/09/2019   TSH 1.78 03/07/2019   HGBA1C 6.0 02/20/2017    MM 3D SCREEN BREAST BILATERAL  Result Date: 11/24/2019 CLINICAL DATA:  Screening. EXAM: DIGITAL SCREENING BILATERAL MAMMOGRAM WITH TOMO AND CAD COMPARISON:  Previous exam(s). ACR Breast Density Category c: The breast tissue is heterogeneously dense, which may obscure small masses. FINDINGS: There are no findings suspicious for malignancy. Images were processed with CAD. IMPRESSION: No mammographic evidence of malignancy. A result letter of this screening mammogram will be mailed directly to the patient. RECOMMENDATION: Screening mammogram in one year. (Code:SM-B-01Y) BI-RADS CATEGORY  1: Negative. Electronically Signed   By: Kristopher Oppenheim M.D.   On: 11/24/2019 16:18    Assessment & Plan:   There are no diagnoses linked to this encounter.   No orders of the defined  types were placed in this encounter.     Follow-up: No follow-ups on file.  Walker Kehr, MD

## 2020-03-10 NOTE — Assessment & Plan Note (Signed)
Lipitor 

## 2020-03-23 ENCOUNTER — Ambulatory Visit: Payer: Medicare Other

## 2020-03-23 ENCOUNTER — Other Ambulatory Visit: Payer: Self-pay

## 2020-03-23 DIAGNOSIS — R0683 Snoring: Secondary | ICD-10-CM

## 2020-03-23 DIAGNOSIS — G4733 Obstructive sleep apnea (adult) (pediatric): Secondary | ICD-10-CM | POA: Diagnosis not present

## 2020-03-29 ENCOUNTER — Telehealth: Payer: Self-pay | Admitting: Pulmonary Disease

## 2020-03-29 DIAGNOSIS — G4733 Obstructive sleep apnea (adult) (pediatric): Secondary | ICD-10-CM | POA: Diagnosis not present

## 2020-03-29 NOTE — Telephone Encounter (Signed)
HST 03/23/20 >> AHI 5.9, SpO2 low 83%   Please inform her that her sleep study shows mild obstructive sleep apnea.  Please arrange for ROV with me or NP to discuss treatment options.

## 2020-03-30 NOTE — Telephone Encounter (Signed)
Spoke with pt and advised of Dr Juanetta Gosling recommendations. Appt scheduled with Rexene Edison, NP 04/08/20. Nothing further needed at this time.

## 2020-04-01 ENCOUNTER — Other Ambulatory Visit: Payer: Self-pay | Admitting: Internal Medicine

## 2020-04-01 MED ORDER — FUROSEMIDE 20 MG PO TABS: 10.0000 mg | ORAL_TABLET | Freq: Every day | ORAL | 5 refills | Status: DC | PRN

## 2020-04-08 ENCOUNTER — Ambulatory Visit (INDEPENDENT_AMBULATORY_CARE_PROVIDER_SITE_OTHER): Payer: Medicare Other | Admitting: Adult Health

## 2020-04-08 ENCOUNTER — Encounter: Payer: Self-pay | Admitting: Adult Health

## 2020-04-08 ENCOUNTER — Other Ambulatory Visit: Payer: Self-pay

## 2020-04-08 DIAGNOSIS — G4733 Obstructive sleep apnea (adult) (pediatric): Secondary | ICD-10-CM

## 2020-04-08 DIAGNOSIS — I25119 Atherosclerotic heart disease of native coronary artery with unspecified angina pectoris: Secondary | ICD-10-CM

## 2020-04-08 NOTE — Patient Instructions (Addendum)
You have mild sleep apnea.  Recommend healthy sleep regimen , healthy weight loss .  May call Dr. Ron Parker [phone] regarding oral appliance  If oral appliance does not work out and you would like to proceed with CPAP , please call back for sooner visit.  Follow up in 4-6 months with Dr. Halford Chessman  Or APP and As needed      Sleep Apnea Sleep apnea affects breathing during sleep. It causes breathing to stop for a short time or to become shallow. It can also increase the risk of:  Heart attack.  Stroke.  Being very overweight (obese).  Diabetes.  Heart failure.  Irregular heartbeat. The goal of treatment is to help you breathe normally again. What are the causes? There are three kinds of sleep apnea:  Obstructive sleep apnea. This is caused by a blocked or collapsed airway.  Central sleep apnea. This happens when the brain does not send the right signals to the muscles that control breathing.  Mixed sleep apnea. This is a combination of obstructive and central sleep apnea. The most common cause of this condition is a collapsed or blocked airway. This can happen if:  Your throat muscles are too relaxed.  Your tongue and tonsils are too large.  You are overweight.  Your airway is too small. What increases the risk?  Being overweight.  Smoking.  Having a small airway.  Being older.  Being female.  Drinking alcohol.  Taking medicines to calm yourself (sedatives or tranquilizers).  Having family members with the condition. What are the signs or symptoms?  Trouble staying asleep.  Being sleepy or tired during the day.  Getting angry a lot.  Loud snoring.  Headaches in the morning.  Not being able to focus your mind (concentrate).  Forgetting things.  Less interest in sex.  Mood swings.  Personality changes.  Feelings of sadness (depression).  Waking up a lot during the night to pee (urinate).  Dry mouth.  Sore throat. How is this  diagnosed?  Your medical history.  A physical exam.  A test that is done when you are sleeping (sleep study). The test is most often done in a sleep lab but may also be done at home. How is this treated?   Sleeping on your side.  Using a medicine to get rid of mucus in your nose (decongestant).  Avoiding the use of alcohol, medicines to help you relax, or certain pain medicines (narcotics).  Losing weight, if needed.  Changing your diet.  Not smoking.  Using a machine to open your airway while you sleep, such as: ? An oral appliance. This is a mouthpiece that shifts your lower jaw forward. ? A CPAP device. This device blows air through a mask when you breathe out (exhale). ? An EPAP device. This has valves that you put in each nostril. ? A BPAP device. This device blows air through a mask when you breathe in (inhale) and breathe out.  Having surgery if other treatments do not work. It is important to get treatment for sleep apnea. Without treatment, it can lead to:  High blood pressure.  Coronary artery disease.  In men, not being able to have an erection (impotence).  Reduced thinking ability. Follow these instructions at home: Lifestyle  Make changes that your doctor recommends.  Eat a healthy diet.  Lose weight if needed.  Avoid alcohol, medicines to help you relax, and some pain medicines.  Do not use any products that contain nicotine or  tobacco, such as cigarettes, e-cigarettes, and chewing tobacco. If you need help quitting, ask your doctor. General instructions  Take over-the-counter and prescription medicines only as told by your doctor.  If you were given a machine to use while you sleep, use it only as told by your doctor.  If you are having surgery, make sure to tell your doctor you have sleep apnea. You may need to bring your device with you.  Keep all follow-up visits as told by your doctor. This is important. Contact a doctor if:  The  machine that you were given to use during sleep bothers you or does not seem to be working.  You do not get better.  You get worse. Get help right away if:  Your chest hurts.  You have trouble breathing in enough air.  You have an uncomfortable feeling in your back, arms, or stomach.  You have trouble talking.  One side of your body feels weak.  A part of your face is hanging down. These symptoms may be an emergency. Do not wait to see if the symptoms will go away. Get medical help right away. Call your local emergency services (911 in the [place] Do not drive yourself to the hospital. Summary  This condition affects breathing during sleep.  The most common cause is a collapsed or blocked airway.  The goal of treatment is to help you breathe normally while you sleep. This information is not intended to replace advice given to you by your health care provider. Make sure you discuss any questions you have with your health care provider. Document Revised: 06/28/2018 Document Reviewed: 05/07/2018 Elsevier Patient Education  [place].

## 2020-04-08 NOTE — Progress Notes (Signed)
Virtual Visit via Telephone Note  I connected with Jennifer Davenport on 04/08/20 at  1:30 PM EDT by telephone and verified that I am speaking with the correct person using two identifiers.  Location: Patient: Home  Provider: Office    I discussed the limitations, risks, security and privacy concerns of performing an evaluation and management service by telephone and the availability of in person appointments. I also discussed with the patient that there may be a patient responsible charge related to this service. The patient expressed understanding and agreed to proceed.   History of Present Illness: 69 year old female seen for sleep consult February 27, 2020.  Today's televisit is a 1 month follow-up for sleep apnea.  Patient was seen last visit for a sleep consult with notable snoring, daytime sleepiness.  Medical history significant for hypertension.  Patient was set up for a home sleep study that showed mild sleep apnea with an AHI at 5.9 and SPO2 low at 83%.  We discussed her test results.  Went over patient education for sleep apnea.  We went over treatment options including weight loss, oral appliance and CPAP.  She would like to proceed with oral appliance referral.  We discussed a healthy sleep regimen and healthy weight loss.  Patient says she does have some daytime sleepiness.  Has intermittent snoring and restless sleep.  Patient Active Problem List   Diagnosis Date Noted  . Daytime somnolence 01/31/2020  . Hand pain, right 12/09/2019  . Anxiety attack 10/08/2018  . Neck pain 10/08/2018  . Syncope 04/03/2018  . CAD (coronary artery disease) 12/06/2017  . Near syncope 08/03/2017  . Gluten intolerance 08/03/2017  . Droopy eyelid, bilateral 08/03/2017  . Actinic keratosis 08/22/2016  . Carotid bruit 08/17/2015  . Well adult exam 02/11/2015  . Benign paroxysmal positional vertigo 04/06/2014  . NUMBNESS, HAND 03/06/2007  . Dyslipidemia 09/28/2006  . Essential hypertension  09/28/2006  . Allergic rhinitis 09/28/2006  . 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 GERD 09/28/2006  . Irritable bowel syndrome 09/28/2006  . Osteoarthritis 09/28/2006  . Arthropathy 09/28/2006  . LOW BACK PAIN 09/28/2006    Current Outpatient Medications on File Prior to Visit  Medication Sig Dispense Refill  . acetaminophen (TYLENOL) 500 MG tablet Take 500 mg by mouth every 6 (six) hours as needed.    [name] amLODipine (NORVASC) 5 MG tablet Take 1 tablet (5 mg total) by mouth daily. 90 tablet 3  . atorvastatin (LIPITOR) 80 MG tablet TAKE ONE TABLET BY MOUTH DAILY AT 6PM 90 tablet 3  . Azelaic Acid 15 % cream Apply topically 1 day or 1 dose. After skin is thoroughly washed and patted dry, gently but thoroughly massage a thin film of azelaic acid cream into the affected area twice daily, in the morning and evening.    [name] b complex vitamins tablet Take 1 tablet by mouth daily.    . Cholecalciferol (VITAMIN D) 50 MCG (2000 UT) tablet     . diclofenac Sodium (VOLTAREN) 1 % GEL Apply 1 application topically 4 (four) times daily. 100 g 3  . dicyclomine (BENTYL) 10 MG capsule TAKE 1 CAPSULE EVERY 6-8 HOURS AS NEEDED FOR CRAMPING AND SPASMS 90 capsule 1  . DULoxetine (CYMBALTA) 20 MG capsule Take 1 capsule (20 mg total) by mouth daily. 90 capsule 3  . fluticasone (FLONASE) 50 MCG/ACT nasal spray Place 1-2 sprays into both nostrils daily.     . furosemide (LASIX) 20 MG tablet Take 0.5-1 tablets (10-20 mg total) by  mouth daily as needed for edema. 30 tablet 5  . guaiFENesin (MUCINEX) 600 MG 12 hr tablet Take 2 tablets (1,200 mg total) by mouth 2 (two) times daily as needed. 60 tablet 0  . Loratadine (CLARITIN) 10 MG CAPS     . LORazepam (ATIVAN) 1 MG tablet Take 0.5-1 tablets (0.5-1 mg total) by mouth 2 (two) times daily as needed for anxiety. 30 tablet 1  . losartan (COZAAR) 100 MG tablet Take 1 tablet (100 mg total) by mouth daily. 90 tablet 3  . omeprazole (PRILOSEC) 10 MG capsule     . triamcinolone cream  (KENALOG) 0.1 %     . prednisoLONE acetate (PRED FORTE) 1 % ophthalmic suspension      No current facility-administered medications on file prior to visit.      Observations/Objective: Speaks in full sentences with no audible distress  Assessment and Plan: Mild obstructive sleep apnea-patient education on sleep apnea and treatment options  Obesity-BMI 32 Advised on healthy weight loss  Plan  Patient Instructions  You have mild sleep apnea.  Recommend healthy sleep regimen , healthy weight loss .  May call Dr. Ron Parker [phone] regarding oral appliance  If oral appliance does not work out and you would like to proceed with CPAP , please call back for sooner visit.  Follow up in 4-6 months with Dr. Halford Chessman  Or APP and As needed      Sleep Apnea Sleep apnea affects breathing during sleep. It causes breathing to stop for a short time or to become shallow. It can also increase the risk of:  Heart attack.  Stroke.  Being very overweight (obese).  Diabetes.  Heart failure.  Irregular heartbeat. The goal of treatment is to help you breathe normally again. What are the causes? There are three kinds of sleep apnea:  Obstructive sleep apnea. This is caused by a blocked or collapsed airway.  Central sleep apnea. This happens when the brain does not send the right signals to the muscles that control breathing.  Mixed sleep apnea. This is a combination of obstructive and central sleep apnea. The most common cause of this condition is a collapsed or blocked airway. This can happen if:  Your throat muscles are too relaxed.  Your tongue and tonsils are too large.  You are overweight.  Your airway is too small. What increases the risk?  Being overweight.  Smoking.  Having a small airway.  Being older.  Being female.  Drinking alcohol.  Taking medicines to calm yourself (sedatives or tranquilizers).  Having family members with the condition. What are the signs or  symptoms?  Trouble staying asleep.  Being sleepy or tired during the day.  Getting angry a lot.  Loud snoring.  Headaches in the morning.  Not being able to focus your mind (concentrate).  Forgetting things.  Less interest in sex.  Mood swings.  Personality changes.  Feelings of sadness (depression).  Waking up a lot during the night to pee (urinate).  Dry mouth.  Sore throat. How is this diagnosed?  Your medical history.  A physical exam.  A test that is done when you are sleeping (sleep study). The test is most often done in a sleep lab but may also be done at home. How is this treated?   Sleeping on your side.  Using a medicine to get rid of mucus in your nose (decongestant).  Avoiding the use of alcohol, medicines to help you relax, or certain pain medicines (narcotics).  Losing weight, if needed.  Changing your diet.  Not smoking.  Using a machine to open your airway while you sleep, such as: ? An oral appliance. This is a mouthpiece that shifts your lower jaw forward. ? A CPAP device. This device blows air through a mask when you breathe out (exhale). ? An EPAP device. This has valves that you put in each nostril. ? A BPAP device. This device blows air through a mask when you breathe in (inhale) and breathe out.  Having surgery if other treatments do not work. It is important to get treatment for sleep apnea. Without treatment, it can lead to:  High blood pressure.  Coronary artery disease.  In men, not being able to have an erection (impotence).  Reduced thinking ability. Follow these instructions at home: Lifestyle  Make changes that your doctor recommends.  Eat a healthy diet.  Lose weight if needed.  Avoid alcohol, medicines to help you relax, and some pain medicines.  Do not use any products that contain nicotine or tobacco, such as cigarettes, e-cigarettes, and chewing tobacco. If you need help quitting, ask your  doctor. General instructions  Take over-the-counter and prescription medicines only as told by your doctor.  If you were given a machine to use while you sleep, use it only as told by your doctor.  If you are having surgery, make sure to tell your doctor you have sleep apnea. You may need to bring your device with you.  Keep all follow-up visits as told by your doctor. This is important. Contact a doctor if:  The machine that you were given to use during sleep bothers you or does not seem to be working.  You do not get better.  You get worse. Get help right away if:  Your chest hurts.  You have trouble breathing in enough air.  You have an uncomfortable feeling in your back, arms, or stomach.  You have trouble talking.  One side of your body feels weak.  A part of your face is hanging down. These symptoms may be an emergency. Do not wait to see if the symptoms will go away. Get medical help right away. Call your local emergency services (911 in the [place] Do not drive yourself to the hospital. Summary  This condition affects breathing during sleep.  The most common cause is a collapsed or blocked airway.  The goal of treatment is to help you breathe normally while you sleep. This information is not intended to replace advice given to you by your health care provider. Make sure you discuss any questions you have with your health care provider. Document Revised: 06/28/2018 Document Reviewed: 05/07/2018 Elsevier Patient Education  [place].      Follow Up Instructions:    I discussed the assessment and treatment plan with the patient. The patient was provided an opportunity to ask questions and all were answered. The patient agreed with the plan and demonstrated an understanding of the instructions.   The patient was advised to call back or seek an in-person evaluation if the symptoms worsen or if the condition fails to improve as anticipated.  I provided 22   minutes of non-face-to-face time during this encounter.   Rexene Edison, NP

## 2020-04-09 NOTE — Progress Notes (Signed)
Reviewed and agree with assessment/plan.   Chesley Mires, MD [facility] Pulmonary/Critical Care 04/09/2020, 8:15 AM Pager:  [phone]

## 2020-06-02 ENCOUNTER — Other Ambulatory Visit: Payer: Self-pay

## 2020-06-02 ENCOUNTER — Encounter: Payer: Self-pay | Admitting: Internal Medicine

## 2020-06-02 ENCOUNTER — Ambulatory Visit (INDEPENDENT_AMBULATORY_CARE_PROVIDER_SITE_OTHER): Payer: Medicare Other | Admitting: Internal Medicine

## 2020-06-02 VITALS — BP 160/98 | HR 78 | Temp 98.4°F | Ht 60.0 in | Wt 162.0 lb

## 2020-06-02 DIAGNOSIS — M129 Arthropathy, unspecified: Secondary | ICD-10-CM | POA: Diagnosis not present

## 2020-06-02 DIAGNOSIS — I25119 Atherosclerotic heart disease of native coronary artery with unspecified angina pectoris: Secondary | ICD-10-CM

## 2020-06-02 DIAGNOSIS — M79641 Pain in right hand: Secondary | ICD-10-CM | POA: Diagnosis not present

## 2020-06-02 DIAGNOSIS — M65341 Trigger finger, right ring finger: Secondary | ICD-10-CM | POA: Diagnosis not present

## 2020-06-02 DIAGNOSIS — M6530 Trigger finger, unspecified finger: Secondary | ICD-10-CM | POA: Insufficient documentation

## 2020-06-02 MED ORDER — METHYLPREDNISOLONE 4 MG PO TBPK
ORAL_TABLET | ORAL | 0 refills | Status: DC
Start: 2020-06-02 — End: 2020-09-09

## 2020-06-02 NOTE — Progress Notes (Signed)
Subjective:  Patient ID: Jennifer Davenport, female    DOB: 07-15-1951  Age: 69 y.o. MRN: [id number]  CC: No chief complaint on file.   HPI Jennifer Davenport presents for R hand pain, ring finger locking - worse  Outpatient Medications Prior to Visit  Medication Sig Dispense Refill  . acetaminophen (TYLENOL) 500 MG tablet Take 500 mg by mouth every 6 (six) hours as needed.    [name] amLODipine (NORVASC) 5 MG tablet Take 1 tablet (5 mg total) by mouth daily. 90 tablet 3  . atorvastatin (LIPITOR) 80 MG tablet TAKE ONE TABLET BY MOUTH DAILY AT 6PM 90 tablet 3  . Azelaic Acid 15 % cream Apply topically 1 day or 1 dose. After skin is thoroughly washed and patted dry, gently but thoroughly massage a thin film of azelaic acid cream into the affected area twice daily, in the morning and evening.    [name] b complex vitamins tablet Take 1 tablet by mouth daily.    . Cholecalciferol (VITAMIN D) 50 MCG (2000 UT) tablet     . diclofenac Sodium (VOLTAREN) 1 % GEL Apply 1 application topically 4 (four) times daily. 100 g 3  . dicyclomine (BENTYL) 10 MG capsule TAKE 1 CAPSULE EVERY 6-8 HOURS AS NEEDED FOR CRAMPING AND SPASMS 90 capsule 1  . DULoxetine (CYMBALTA) 20 MG capsule Take 1 capsule (20 mg total) by mouth daily. 90 capsule 3  . fluticasone (FLONASE) 50 MCG/ACT nasal spray Place 1-2 sprays into both nostrils daily.     . furosemide (LASIX) 20 MG tablet Take 0.5-1 tablets (10-20 mg total) by mouth daily as needed for edema. 30 tablet 5  . guaiFENesin (MUCINEX) 600 MG 12 hr tablet Take 2 tablets (1,200 mg total) by mouth 2 (two) times daily as needed. 60 tablet 0  . Loratadine (CLARITIN) 10 MG CAPS     . LORazepam (ATIVAN) 1 MG tablet Take 0.5-1 tablets (0.5-1 mg total) by mouth 2 (two) times daily as needed for anxiety. 30 tablet 1  . losartan (COZAAR) 100 MG tablet Take 1 tablet (100 mg total) by mouth daily. 90 tablet 3  . omeprazole (PRILOSEC) 10 MG capsule     . triamcinolone cream (KENALOG) 0.1 %      . prednisoLONE acetate (PRED FORTE) 1 % ophthalmic suspension      No facility-administered medications prior to visit.    ROS: Review of Systems  Musculoskeletal: Positive for arthralgias.    Objective:  BP (!) 160/98 (BP Location: Right Arm, Patient Position: Sitting, Cuff Size: Normal)   Pulse 78   Temp 98.4 F (36.9 C) (Oral)   Ht 5' (1.524 m)   Wt 162 lb (73.5 kg)   LMP 11/24/1982   SpO2 98%   BMI 31.64 kg/m   BP Readings from Last 3 Encounters:  06/02/20 (!) 160/98  03/10/20 (!) 176/94  02/27/20 120/80    Wt Readings from Last 3 Encounters:  06/02/20 162 lb (73.5 kg)  03/10/20 165 lb (74.8 kg)  02/27/20 163 lb 12.8 oz (74.3 kg)    Physical Exam Constitutional:      General: She is not in acute distress.    Appearance: She is well-developed.  HENT:     Head: Normocephalic.     Right Ear: External ear normal.     Left Ear: External ear normal.     Nose: Nose normal.  Eyes:     General:        Right eye: No discharge.  Left eye: No discharge.     Conjunctiva/sclera: Conjunctivae normal.     Pupils: Pupils are equal, round, and reactive to light.  Neck:     Thyroid: No thyromegaly.     Vascular: No JVD.     Trachea: No tracheal deviation.  Cardiovascular:     Rate and Rhythm: Normal rate and regular rhythm.     Heart sounds: Normal heart sounds.  Pulmonary:     Effort: No respiratory distress.     Breath sounds: No stridor. No wheezing.  Abdominal:     General: Bowel sounds are normal. There is no distension.     Palpations: Abdomen is soft. There is no mass.     Tenderness: There is no abdominal tenderness. There is no guarding or rebound.  Musculoskeletal:        General: Tenderness present.     Cervical back: Normal range of motion and neck supple.  Lymphadenopathy:     Cervical: No cervical adenopathy.  Skin:    Findings: No erythema or rash.  Neurological:     Mental Status: She is oriented to person, place, and time.      Cranial Nerves: No cranial nerve deficit.     Motor: No abnormal muscle tone.     Coordination: Coordination normal.     Deep Tendon Reflexes: Reflexes normal.  Psychiatric:        Behavior: Behavior normal.        Thought Content: Thought content normal.        Judgment: Judgment normal.    R hand w/OA, tender MCP Pain CTS signs are (-)  R 4th finger is triggering R thumb base tender   Lab Results  Component Value Date   WBC 5.1 03/07/2019   HGB 12.6 03/07/2019   HCT 38.5 03/07/2019   PLT 211.0 03/07/2019   GLUCOSE 92 03/10/2020   CHOL 155 03/10/2020   TRIG 127.0 03/10/2020   HDL 74.80 03/10/2020   LDLCALC 55 03/10/2020   ALT 24 03/10/2020   AST 21 03/10/2020   NA 137 03/10/2020   K 4.0 03/10/2020   CL 102 03/10/2020   CREATININE 0.61 03/10/2020   BUN 11 03/10/2020   CO2 28 03/10/2020   TSH 1.48 03/10/2020   HGBA1C 6.0 02/20/2017    MM 3D SCREEN BREAST BILATERAL  Result Date: 11/24/2019 CLINICAL DATA:  Screening. EXAM: DIGITAL SCREENING BILATERAL MAMMOGRAM WITH TOMO AND CAD COMPARISON:  Previous exam(s). ACR Breast Density Category c: The breast tissue is heterogeneously dense, which may obscure small masses. FINDINGS: There are no findings suspicious for malignancy. Images were processed with CAD. IMPRESSION: No mammographic evidence of malignancy. A result letter of this screening mammogram will be mailed directly to the patient. RECOMMENDATION: Screening mammogram in one year. (Code:SM-B-01Y) BI-RADS CATEGORY  1: Negative. Electronically Signed   By: Kristopher Oppenheim M.D.   On: 11/24/2019 16:18    Assessment & Plan:      Follow-up: No follow-ups on file.  Walker Kehr, MD

## 2020-06-02 NOTE — Assessment & Plan Note (Signed)
Inj offered Medrol pack

## 2020-06-02 NOTE — Patient Instructions (Signed)
Trigger Finger  Trigger finger, also called stenosing tenosynovitis,  is a condition that causes a finger to get stuck in a bent position. Each finger has a tendon, which is a tough, cord-like tissue that connects muscle to bone, and each tendon passes through a tunnel of tissue called a tendon sheath. To move your finger, your tendon needs to glide freely through the sheath. Trigger finger happens when the tendon or the sheath thickens, making it difficult to move your finger. Trigger finger can affect any finger or a thumb. It may affect more than one finger. Mild cases may clear up with rest and medicine. Severe cases require more treatment. What are the causes? Trigger finger is caused by a thickened finger tendon or tendon sheath. The cause of this thickening is not known. What increases the risk? The following factors may make you more likely to develop this condition:  Doing activities that require a strong grip.  Having rheumatoid arthritis, gout, or diabetes.  Being 40-60 years old.  Being female. What are the signs or symptoms? Symptoms of this condition include:  Pain when bending or straightening your finger.  Tenderness or swelling where your finger attaches to the palm of your hand.  A lump in the palm of your hand or on the inside of your finger.  Hearing a noise like a pop or a snap when you try to straighten your finger.  Feeling a catching or locking sensation when you try to straighten your finger.  Being unable to straighten your finger. How is this diagnosed? This condition is diagnosed based on your symptoms and a physical exam. How is this treated? This condition may be treated by:  Resting your finger and avoiding activities that make symptoms worse.  Wearing a finger splint to keep your finger extended.  Taking NSAIDs, such as ibuprofen, to relieve pain and swelling.  Doing gentle exercises to stretch the finger as told by your health care provider.   Having medicine that reduces swelling and inflammation (steroids) injected into the tendon sheath. Injections may need to be repeated.  Having surgery to open the tendon sheath. This may be done if other treatments do not work and you cannot straighten your finger. You may need physical therapy after surgery. Follow these instructions at home: If you have a splint:  Wear the splint as told by your health care provider. Remove it only as told by your health care provider.  Loosen it if your fingers tingle, become numb, or turn cold and blue.  Keep it clean.  If the splint is not waterproof: ? Do not let it get wet. ? Cover it with a watertight covering when you take a bath or shower. Managing pain, stiffness, and swelling     If directed, apply heat to the affected area as often as told by your health care provider. Use the heat source that your health care provider recommends, such as a moist heat pack or a heating pad.  Place a towel between your skin and the heat source.  Leave the heat on for 20-30 minutes.  Remove the heat if your skin turns bright red. This is especially important if you are unable to feel pain, heat, or cold. You may have a greater risk of getting burned. If directed, put ice on the painful area. To do this:  If you have a removable splint, remove it as told by your health care provider.  Put ice in a plastic bag.  Place a   towel between your skin and the bag or between your splint and the bag.  Leave the ice on for 20 minutes, 2-3 times a day.  Activity  Rest your finger as told by your health care provider. Avoid activities that make the pain worse.  Return to your normal activities as told by your health care provider. Ask your health care provider what activities are safe for you.  Do exercises as told by your health care provider.  Ask your health care provider when it is safe to drive if you have a splint on your hand. General instructions   Take over-the-counter and prescription medicines only as told by your health care provider.  Keep all follow-up visits as told by your health care provider. This is important. Contact a health care provider if:  Your symptoms are not improving with home care. Summary  Trigger finger, also called stenosing tenosynovitis, causes your finger to get stuck in a bent position. This can make it difficult and painful to straighten your finger.  This condition develops when a finger tendon or tendon sheath thickens.  Treatment may include resting your finger, wearing a splint, and taking medicines.  In severe cases, surgery to open the tendon sheath may be needed. This information is not intended to replace advice given to you by your health care provider. Make sure you discuss any questions you have with your health care provider. Document Revised: 01/27/2019 Document Reviewed: 01/27/2019 Elsevier Patient Education  2020 [employer]  

## 2020-06-02 NOTE — Assessment & Plan Note (Signed)
R 4th finger is triggering Inj offered Medrol pack

## 2020-06-27 DIAGNOSIS — Z23 Encounter for immunization: Secondary | ICD-10-CM | POA: Diagnosis not present

## 2020-07-01 ENCOUNTER — Ambulatory Visit: Payer: Medicare Other | Admitting: Internal Medicine

## 2020-07-10 ENCOUNTER — Telehealth: Payer: Self-pay

## 2020-07-10 ENCOUNTER — Ambulatory Visit (INDEPENDENT_AMBULATORY_CARE_PROVIDER_SITE_OTHER): Payer: Medicare Other

## 2020-07-10 DIAGNOSIS — Z23 Encounter for immunization: Secondary | ICD-10-CM

## 2020-07-10 MED ORDER — ZOSTER VAC RECOMB ADJUVANTED 50 MCG/0.5ML IM SUSR
0.5000 mL | Freq: Once | INTRAMUSCULAR | 1 refills | Status: AC
Start: 2020-07-10 — End: 2020-07-10

## 2020-07-10 NOTE — Telephone Encounter (Signed)
Pt came inf for flu vaccine and we spoke about the shingles vaccine. Pt agreed to have shingrix sent to the pharmacy.

## 2020-07-14 ENCOUNTER — Other Ambulatory Visit: Payer: Self-pay

## 2020-07-28 DIAGNOSIS — M2550 Pain in unspecified joint: Secondary | ICD-10-CM | POA: Diagnosis not present

## 2020-07-28 DIAGNOSIS — R5383 Other fatigue: Secondary | ICD-10-CM | POA: Diagnosis not present

## 2020-07-28 DIAGNOSIS — R21 Rash and other nonspecific skin eruption: Secondary | ICD-10-CM | POA: Diagnosis not present

## 2020-07-28 DIAGNOSIS — M064 Inflammatory polyarthropathy: Secondary | ICD-10-CM | POA: Diagnosis not present

## 2020-07-28 DIAGNOSIS — Z79899 Other long term (current) drug therapy: Secondary | ICD-10-CM | POA: Diagnosis not present

## 2020-07-28 DIAGNOSIS — M150 Primary generalized (osteo)arthritis: Secondary | ICD-10-CM | POA: Diagnosis not present

## 2020-07-28 DIAGNOSIS — E669 Obesity, unspecified: Secondary | ICD-10-CM | POA: Diagnosis not present

## 2020-07-28 DIAGNOSIS — Z6833 Body mass index (BMI) 33.0-33.9, adult: Secondary | ICD-10-CM | POA: Diagnosis not present

## 2020-08-27 DIAGNOSIS — M2550 Pain in unspecified joint: Secondary | ICD-10-CM | POA: Diagnosis not present

## 2020-08-27 DIAGNOSIS — M150 Primary generalized (osteo)arthritis: Secondary | ICD-10-CM | POA: Diagnosis not present

## 2020-08-27 DIAGNOSIS — E669 Obesity, unspecified: Secondary | ICD-10-CM | POA: Diagnosis not present

## 2020-08-27 DIAGNOSIS — Z6834 Body mass index (BMI) 34.0-34.9, adult: Secondary | ICD-10-CM | POA: Diagnosis not present

## 2020-08-27 DIAGNOSIS — M0609 Rheumatoid arthritis without rheumatoid factor, multiple sites: Secondary | ICD-10-CM | POA: Diagnosis not present

## 2020-09-09 ENCOUNTER — Ambulatory Visit (INDEPENDENT_AMBULATORY_CARE_PROVIDER_SITE_OTHER): Payer: Medicare Other

## 2020-09-09 ENCOUNTER — Other Ambulatory Visit: Payer: Self-pay

## 2020-09-09 ENCOUNTER — Encounter: Payer: Self-pay | Admitting: Internal Medicine

## 2020-09-09 ENCOUNTER — Ambulatory Visit (INDEPENDENT_AMBULATORY_CARE_PROVIDER_SITE_OTHER): Payer: Medicare Other | Admitting: Internal Medicine

## 2020-09-09 DIAGNOSIS — M129 Arthropathy, unspecified: Secondary | ICD-10-CM

## 2020-09-09 DIAGNOSIS — I25119 Atherosclerotic heart disease of native coronary artery with unspecified angina pectoris: Secondary | ICD-10-CM

## 2020-09-09 DIAGNOSIS — E785 Hyperlipidemia, unspecified: Secondary | ICD-10-CM | POA: Diagnosis not present

## 2020-09-09 DIAGNOSIS — R059 Cough, unspecified: Secondary | ICD-10-CM | POA: Diagnosis not present

## 2020-09-09 LAB — CBC WITH DIFFERENTIAL/PLATELET
Basophils Absolute: 0.1 10*3/uL (ref 0.0–0.1)
Basophils Relative: 1 % (ref 0.0–3.0)
Eosinophils Absolute: 0.1 10*3/uL (ref 0.0–0.7)
Eosinophils Relative: 2.4 % (ref 0.0–5.0)
HCT: 38.5 % (ref 36.0–46.0)
Hemoglobin: 12.6 g/dL (ref 12.0–15.0)
Lymphocytes Relative: 30.6 % (ref 12.0–46.0)
Lymphs Abs: 1.8 10*3/uL (ref 0.7–4.0)
MCHC: 32.8 g/dL (ref 30.0–36.0)
MCV: 87.3 fl (ref 78.0–100.0)
Monocytes Absolute: 0.5 10*3/uL (ref 0.1–1.0)
Monocytes Relative: 9.2 % (ref 3.0–12.0)
Neutro Abs: 3.3 10*3/uL (ref 1.4–7.7)
Neutrophils Relative %: 56.8 % (ref 43.0–77.0)
Platelets: 310 10*3/uL (ref 150.0–400.0)
RBC: 4.4 Mil/uL (ref 3.87–5.11)
RDW: 14.5 % (ref 11.5–15.5)
WBC: 5.8 10*3/uL (ref 4.0–10.5)

## 2020-09-09 LAB — COMPREHENSIVE METABOLIC PANEL
ALT: 20 U/L (ref 0–35)
AST: 19 U/L (ref 0–37)
Albumin: 4.4 g/dL (ref 3.5–5.2)
Alkaline Phosphatase: 62 U/L (ref 39–117)
BUN: 11 mg/dL (ref 6–23)
CO2: 28 mEq/L (ref 19–32)
Calcium: 9.7 mg/dL (ref 8.4–10.5)
Chloride: 104 mEq/L (ref 96–112)
Creatinine, Ser: 0.6 mg/dL (ref 0.40–1.20)
GFR: 92.34 mL/min (ref >60.00)
Glucose, Bld: 86 mg/dL (ref 70–99)
Potassium: 4.3 mEq/L (ref 3.5–5.1)
Sodium: 138 mEq/L (ref 135–145)
Total Bilirubin: 0.4 mg/dL (ref 0.2–1.2)
Total Protein: 7.6 g/dL (ref 6.0–8.3)

## 2020-09-09 NOTE — Assessment & Plan Note (Signed)
F/u w/Dr Trudie Reed on Plaquenil Lipitor - hold it for 3 weeks to see if pains are better

## 2020-09-09 NOTE — Assessment & Plan Note (Signed)
Hold lipitor x 2 weeks

## 2020-09-09 NOTE — Patient Instructions (Signed)
Hold Amlodipine x 2 weeks   Hold Lipitor x 3 weeks

## 2020-09-09 NOTE — Assessment & Plan Note (Addendum)
Lipitor - hold it for 3 weeks to see if pains are better

## 2020-09-09 NOTE — Progress Notes (Signed)
Subjective:  Patient ID: Jennifer Davenport, female    DOB: [birth year]  Age: 69 y.o. MRN: [id number]  CC: Diabetes (6 month f/u)   HPI Jennifer Davenport presents for arthritis - ?sero (-) RA and ?lupus - on Plaquenil C/o cough after she started Norvasc F/u HTN - BP ok at home  Outpatient Medications Prior to Visit  Medication Sig Dispense Refill  . acetaminophen (TYLENOL) 500 MG tablet Take 500 mg by mouth every 6 (six) hours as needed.    [name] amLODipine (NORVASC) 5 MG tablet Take 1 tablet (5 mg total) by mouth daily. 90 tablet 3  . atorvastatin (LIPITOR) 80 MG tablet TAKE ONE TABLET BY MOUTH DAILY AT 6PM 90 tablet 3  . Azelaic Acid 15 % cream Apply topically 1 day or 1 dose. After skin is thoroughly washed and patted dry, gently but thoroughly massage a thin film of azelaic acid cream into the affected area twice daily, in the morning and evening.    [name] b complex vitamins tablet Take 1 tablet by mouth daily.    . Cholecalciferol (VITAMIN D) 50 MCG (2000 UT) tablet     . diclofenac Sodium (VOLTAREN) 1 % GEL Apply 1 application topically 4 (four) times daily. 100 g 3  . dicyclomine (BENTYL) 10 MG capsule TAKE 1 CAPSULE EVERY 6-8 HOURS AS NEEDED FOR CRAMPING AND SPASMS 90 capsule 1  . DULoxetine (CYMBALTA) 20 MG capsule Take 1 capsule (20 mg total) by mouth daily. 90 capsule 3  . fluticasone (FLONASE) 50 MCG/ACT nasal spray Place 1-2 sprays into both nostrils daily.     . furosemide (LASIX) 20 MG tablet Take 0.5-1 tablets (10-20 mg total) by mouth daily as needed for edema. 30 tablet 5  . guaiFENesin (MUCINEX) 600 MG 12 hr tablet Take 2 tablets (1,200 mg total) by mouth 2 (two) times daily as needed. 60 tablet 0  . hydroxychloroquine (PLAQUENIL) 200 MG tablet Take 1 tablet a day    . Loratadine 10 MG CAPS     . LORazepam (ATIVAN) 1 MG tablet Take 0.5-1 tablets (0.5-1 mg total) by mouth 2 (two) times daily as needed for anxiety. 30 tablet 1  . losartan (COZAAR) 100 MG tablet Take 1  tablet (100 mg total) by mouth daily. 90 tablet 3  . omeprazole (PRILOSEC) 10 MG capsule     . triamcinolone cream (KENALOG) 0.1 %     . methylPREDNISolone (MEDROL DOSEPAK) 4 MG TBPK tablet As directed 21 tablet 0  . prednisoLONE acetate (PRED FORTE) 1 % ophthalmic suspension      No facility-administered medications prior to visit.    ROS: Review of Systems  Constitutional: Negative for activity change, appetite change, chills, fatigue and unexpected weight change.  HENT: Negative for congestion, mouth sores and sinus pressure.   Eyes: Negative for visual disturbance.  Respiratory: Positive for cough. Negative for chest tightness, shortness of breath and wheezing.   Gastrointestinal: Negative for abdominal pain and nausea.  Genitourinary: Negative for difficulty urinating, frequency and vaginal pain.  Musculoskeletal: Positive for arthralgias. Negative for back pain and gait problem.  Skin: Negative for pallor and rash.  Neurological: Negative for dizziness, tremors, weakness, numbness and headaches.  Psychiatric/Behavioral: Negative for confusion and sleep disturbance.    Objective:  BP (!) 152/80 (BP Location: Left Arm)   Pulse 90   Temp 98.8 F (37.1 C) (Oral)   Wt 168 lb (76.2 kg)   LMP 11/24/1982   SpO2 97%   BMI 32.81  kg/m   BP Readings from Last 3 Encounters:  09/09/20 (!) 152/80  06/02/20 (!) 160/98  03/10/20 (!) 176/94    Wt Readings from Last 3 Encounters:  09/09/20 168 lb (76.2 kg)  06/02/20 162 lb (73.5 kg)  03/10/20 165 lb (74.8 kg)    Physical Exam Constitutional:      General: She is not in acute distress.    Appearance: She is well-developed. She is obese.  HENT:     Head: Normocephalic.     Right Ear: External ear normal.     Left Ear: External ear normal.     Nose: Nose normal.     Mouth/Throat:     Mouth: Oropharynx is clear and moist.  Eyes:     General:        Right eye: No discharge.        Left eye: No discharge.      Conjunctiva/sclera: Conjunctivae normal.     Pupils: Pupils are equal, round, and reactive to light.  Neck:     Thyroid: No thyromegaly.     Vascular: No JVD.     Trachea: No tracheal deviation.  Cardiovascular:     Rate and Rhythm: Normal rate and regular rhythm.     Heart sounds: Normal heart sounds.  Pulmonary:     Effort: No respiratory distress.     Breath sounds: No stridor. No wheezing.  Abdominal:     General: Bowel sounds are normal. There is no distension.     Palpations: Abdomen is soft. There is no mass.     Tenderness: There is no abdominal tenderness. There is no guarding or rebound.  Musculoskeletal:        General: Tenderness present. No edema.     Cervical back: Normal range of motion and neck supple.  Lymphadenopathy:     Cervical: No cervical adenopathy.  Skin:    Findings: No erythema or rash.  Neurological:     Mental Status: She is oriented to person, place, and time.     Cranial Nerves: No cranial nerve deficit.     Motor: No abnormal muscle tone.     Coordination: Coordination normal.     Deep Tendon Reflexes: Reflexes normal.  Psychiatric:        Mood and Affect: Mood and affect normal.        Behavior: Behavior normal.        Thought Content: Thought content normal.        Judgment: Judgment normal.   knees, hands w/pain  Lab Results  Component Value Date   WBC 5.1 03/07/2019   HGB 12.6 03/07/2019   HCT 38.5 03/07/2019   PLT 211.0 03/07/2019   GLUCOSE 92 03/10/2020   CHOL 155 03/10/2020   TRIG 127.0 03/10/2020   HDL 74.80 03/10/2020   LDLCALC 55 03/10/2020   ALT 24 03/10/2020   AST 21 03/10/2020   NA 137 03/10/2020   K 4.0 03/10/2020   CL 102 03/10/2020   CREATININE 0.61 03/10/2020   BUN 11 03/10/2020   CO2 28 03/10/2020   TSH 1.48 03/10/2020   HGBA1C 6.0 02/20/2017    MM 3D SCREEN BREAST BILATERAL  Result Date: 11/24/2019 CLINICAL DATA:  Screening. EXAM: DIGITAL SCREENING BILATERAL MAMMOGRAM WITH TOMO AND CAD COMPARISON:   Previous exam(s). ACR Breast Density Category c: The breast tissue is heterogeneously dense, which may obscure small masses. FINDINGS: There are no findings suspicious for malignancy. Images were processed with CAD. IMPRESSION: No mammographic evidence  of malignancy. A result letter of this screening mammogram will be mailed directly to the patient. RECOMMENDATION: Screening mammogram in one year. (Code:SM-B-01Y) BI-RADS CATEGORY  1: Negative. Electronically Signed   By: Kristopher Oppenheim M.D.   On: 11/24/2019 16:18    Assessment & Plan:    Walker Kehr, MD

## 2020-09-09 NOTE — Assessment & Plan Note (Signed)
Hold Amlodipine x 2 weeks CXR

## 2020-09-09 NOTE — Addendum Note (Signed)
Addended by: Cresenciano Lick on: 09/09/2020 09:18 AM   Modules accepted: Orders

## 2020-10-08 ENCOUNTER — Other Ambulatory Visit: Payer: Self-pay | Admitting: Internal Medicine

## 2020-10-18 ENCOUNTER — Encounter: Payer: Self-pay | Admitting: Pulmonary Disease

## 2020-10-18 ENCOUNTER — Other Ambulatory Visit: Payer: Self-pay

## 2020-10-18 ENCOUNTER — Ambulatory Visit (INDEPENDENT_AMBULATORY_CARE_PROVIDER_SITE_OTHER): Payer: Medicare Other | Admitting: Pulmonary Disease

## 2020-10-18 VITALS — BP 140/76 | HR 90 | Temp 97.2°F | Ht 60.5 in | Wt 168.2 lb

## 2020-10-18 DIAGNOSIS — G4733 Obstructive sleep apnea (adult) (pediatric): Secondary | ICD-10-CM

## 2020-10-18 NOTE — Progress Notes (Signed)
[facility]  Chief Complaint  Patient presents with  . Follow-up    Had sleep study done with Dr Ron Parker with dental device    Constitutional:  BP 140/76 (BP Location: Left Arm, Cuff Size: Normal)   Pulse 90   Temp (!) 97.2 F (36.2 C) (Other (Comment)) Comment (Src): wrist  Ht 5' 0.5" (1.537 m)   Wt 168 lb 3.2 oz (76.3 kg)   LMP 11/24/1982   SpO2 98% Comment: Room air  BMI 32.31 kg/m   Past Medical History:  Allergies, Cataract, Endometriosis, GERD, HLD, HTN, IBS  Past Surgical History:  She  has a past surgical history that includes Appendectomy (1/72); TMJ Arthroplasty (6/94); Bunionectomy (Left, 04/2004); Tubal ligation (Bilateral, 5/72); Laparoscopic cholecystectomy (02/2005); Hysterotomy (5/72); Abdominal hysterectomy (11/1982); Cervix lesion destruction; Cryotherapy; eyelid surgery (Bilateral, 2018); Colonoscopy; Polypectomy; Upper gastrointestinal endoscopy; and Cataract extraction.  Brief Summary:  Jennifer Davenport is a 70 y.o. female with obstructive sleep apnea.      Subjective:   Home sleep study from June showed mild sleep apnea.  Saw Tammy Parrett in July.  Decided to work on weight reduction.  Weight in June was 163 lbs.  Her weight today is 168 lbs.  She was also seen by Dr. Oneal Grout.  She is now using oral appliance.  This has helped her sleep quality and daytime alertness.  No issues with jaw pain or trouble chewing.  Physical Exam:   Appearance - well kempt   ENMT - no sinus tenderness, no oral exudate, no LAN, Mallampati 4 airway, no stridor, high arched palate  Respiratory - equal breath sounds bilaterally, no wheezing or rales  CV - s1s2 regular rate and rhythm, no murmurs  Ext - no clubbing, no edema  Skin - no rashes  Psych - normal mood and affect   Sleep Tests:   HST 03/23/20 >> AHI 5.9, SpO2 low 83%  Cardiac Tests:   Echo 12/20/17 >> EF 55 to 60%, mild LVH, mild MR  Social History:  She   reports that she has never smoked. She has never used smokeless tobacco. She reports previous alcohol use. She reports that she does not use drugs.  Family History:  Her family history includes Breast cancer (age of onset: 54) in her mother; CVA in her mother; Colon polyps in her mother; Diverticulitis in her mother; Heart disease in her brother, maternal grandfather, maternal uncle, mother, and sister; Hypercholesterolemia in her mother; Hypertension in her brother, father, maternal grandfather, maternal uncle, mother, and sister; Lung cancer (age of onset: 45) in her brother; Osteoarthritis in her mother and sister.     Assessment/Plan:   Obstructive sleep apnea. - she is followed by Dr. Oneal Grout for mandibular advancement device - use of oral appliance has improved her sleep quality - will get copy of her recent home sleep study from Dr. Kae Heller office and let her know if any adjustments are needed to her set up  Obesity. - discussed how her weight can impact her sleep apnea  Time Spent Involved in Patient Care on Day of Examination:  22 minutes  Follow up:  Patient Instructions  Will get copy of your recent home sleep study from Dr. Kae Heller office and will call you with results  Follow up in 1 year   Medication List:   Allergies as of 10/18/2020      Reactions   Ceftriaxone Sodium    Anaphylaxis shock Can take Amoxicillin ok  Biaxin [clarithromycin]    Chest pain   Sulfonamide Derivatives Nausea Only   Aspirin    Cramps and diarrhea   Benadryl [diphenhydramine]    Not tolerating well   Ciprofloxacin    myalgias   Codeine    Dairy Aid [lactase]    Gluten Meal    Levaquin [levofloxacin In D5w]    hives   Paxil [paroxetine Hcl]    Headache   Pneumovax 23 [pneumococcal Vac Polyvalent]    Rash   Rocephin [ceftriaxone Sodium In Dextrose]    Simvastatin    Leg pains      Medication List       Accurate as of October 18, 2020  3:58 PM. If you have any questions,  ask your nurse or doctor.        acetaminophen 500 MG tablet Commonly known as: TYLENOL Take 500 mg by mouth every 6 (six) hours as needed.   amLODipine 5 MG tablet Commonly known as: NORVASC Take 1 tablet (5 mg total) by mouth daily.   atorvastatin 80 MG tablet Commonly known as: LIPITOR TAKE ONE TABLET BY MOUTH DAILY AT 6PM   Azelaic Acid 15 % cream Apply topically 1 day or 1 dose. After skin is thoroughly washed and patted dry, gently but thoroughly massage a thin film of azelaic acid cream into the affected area twice daily, in the morning and evening.   b complex vitamins tablet Take 1 tablet by mouth daily.   diclofenac Sodium 1 % Gel Commonly known as: Voltaren Apply 1 application topically 4 (four) times daily.   dicyclomine 10 MG capsule Commonly known as: BENTYL TAKE 1 CAPSULE EVERY 6-8 HOURS AS NEEDED FOR CRAMPING AND SPASMS   DULoxetine 20 MG capsule Commonly known as: Cymbalta Take 1 capsule (20 mg total) by mouth daily.   fluticasone 50 MCG/ACT nasal spray Commonly known as: FLONASE Place 1-2 sprays into both nostrils daily.   furosemide 20 MG tablet Commonly known as: LASIX TAKE 1/2 TO 1 TABLET BY MOUTH DAILY AS NEEDED FOR EDEMA   hydroxychloroquine 200 MG tablet Commonly known as: PLAQUENIL Take 1 tablet a day   Loratadine 10 MG Caps   LORazepam 1 MG tablet Commonly known as: ATIVAN Take 0.5-1 tablets (0.5-1 mg total) by mouth 2 (two) times daily as needed for anxiety.   losartan 100 MG tablet Commonly known as: COZAAR Take 1 tablet (100 mg total) by mouth daily.   Mucinex 600 MG 12 hr tablet Generic drug: guaiFENesin Take 2 tablets (1,200 mg total) by mouth 2 (two) times daily as needed.   omeprazole 10 MG capsule Commonly known as: PRILOSEC   triamcinolone 0.1 % Commonly known as: KENALOG   Vitamin D 50 MCG (2000 UT) tablet       Signature:  Chesley Mires, MD [place] Pager - [phone] 10/18/2020,  3:58 PM

## 2020-10-18 NOTE — Patient Instructions (Signed)
Will get copy of your recent home sleep study from Dr. Kae Heller office and will call you with results  Follow up in 1 year

## 2020-10-26 ENCOUNTER — Other Ambulatory Visit: Payer: Self-pay | Admitting: Internal Medicine

## 2020-10-26 DIAGNOSIS — Z1231 Encounter for screening mammogram for malignant neoplasm of breast: Secondary | ICD-10-CM

## 2020-10-28 ENCOUNTER — Telehealth: Payer: Self-pay | Admitting: Pulmonary Disease

## 2020-10-28 NOTE — Telephone Encounter (Signed)
HST 08/04/20 with oral appliance >> AHI 3.7, SpO2 low 83%   Please let her know that the home sleep study from November showed good control of sleep apnea with oral appliance.

## 2020-10-29 NOTE — Telephone Encounter (Signed)
Patient returned phone call, writer went over HST result per Dr Halford Chessman with patient. All questions answered and patient expressed full understanding. Nothing further needed at this time.

## 2020-10-29 NOTE — Telephone Encounter (Signed)
Called and left message on voicemail to please return phone call to go over results. Contact number provided. 

## 2020-11-24 DIAGNOSIS — Z1231 Encounter for screening mammogram for malignant neoplasm of breast: Secondary | ICD-10-CM

## 2020-11-24 DIAGNOSIS — H10412 Chronic giant papillary conjunctivitis, left eye: Secondary | ICD-10-CM | POA: Diagnosis not present

## 2020-11-29 DIAGNOSIS — M310 Hypersensitivity angiitis: Secondary | ICD-10-CM | POA: Diagnosis not present

## 2020-11-29 DIAGNOSIS — E669 Obesity, unspecified: Secondary | ICD-10-CM | POA: Diagnosis not present

## 2020-11-29 DIAGNOSIS — M0609 Rheumatoid arthritis without rheumatoid factor, multiple sites: Secondary | ICD-10-CM | POA: Diagnosis not present

## 2020-11-29 DIAGNOSIS — M150 Primary generalized (osteo)arthritis: Secondary | ICD-10-CM | POA: Diagnosis not present

## 2020-11-29 DIAGNOSIS — Z79899 Other long term (current) drug therapy: Secondary | ICD-10-CM | POA: Diagnosis not present

## 2020-11-29 DIAGNOSIS — M2550 Pain in unspecified joint: Secondary | ICD-10-CM | POA: Diagnosis not present

## 2020-11-29 DIAGNOSIS — Z6832 Body mass index (BMI) 32.0-32.9, adult: Secondary | ICD-10-CM | POA: Diagnosis not present

## 2021-01-11 DIAGNOSIS — Z124 Encounter for screening for malignant neoplasm of cervix: Secondary | ICD-10-CM | POA: Diagnosis not present

## 2021-01-11 DIAGNOSIS — Z6832 Body mass index (BMI) 32.0-32.9, adult: Secondary | ICD-10-CM | POA: Diagnosis not present

## 2021-01-14 ENCOUNTER — Ambulatory Visit
Admission: RE | Admit: 2021-01-14 | Discharge: 2021-01-14 | Disposition: A | Discharge status: Home / Self Care | Payer: Medicare Other | Source: Ambulatory Visit | Attending: Internal Medicine | Admitting: Internal Medicine

## 2021-01-14 ENCOUNTER — Inpatient Hospital Stay: Admission: RE | Admit: 2021-01-14 | Payer: Medicare Other | Source: Ambulatory Visit

## 2021-01-14 ENCOUNTER — Other Ambulatory Visit (HOSPITAL_BASED_OUTPATIENT_CLINIC_OR_DEPARTMENT_OTHER): Payer: Self-pay

## 2021-01-14 ENCOUNTER — Other Ambulatory Visit: Payer: Self-pay

## 2021-01-14 ENCOUNTER — Ambulatory Visit: Payer: Medicare Other | Attending: Internal Medicine

## 2021-01-14 DIAGNOSIS — Z23 Encounter for immunization: Secondary | ICD-10-CM

## 2021-01-14 DIAGNOSIS — Z1231 Encounter for screening mammogram for malignant neoplasm of breast: Secondary | ICD-10-CM | POA: Diagnosis not present

## 2021-01-14 MED ORDER — COVID-19 MRNA VAC-TRIS(PFIZER) 30 MCG/0.3ML IM SUSP
INTRAMUSCULAR | 0 refills | Status: DC
  Filled 2021-01-14: qty 0.3, 1d supply, fill #0

## 2021-01-14 NOTE — Progress Notes (Signed)
   Covid-19 Vaccination Clinic  Name:  Jennifer Davenport    MRN: [id number] DOB: 05-Mar-1951  01/14/2021  [name] was observed post Covid-19 immunization for 15 minutes without incident. She was provided with Vaccine Information Sheet and instruction to access the V-Safe system.   [name] was instructed to call 911 with any severe reactions post vaccine: [name] Difficulty breathing  . Swelling of face and throat  . A fast heartbeat  . A bad rash all over body  . Dizziness and weakness   Immunizations Administered    Name Date Dose VIS Date Route   PFIZER Comrnaty(Gray TOP) Covid-19 Vaccine 01/14/2021 10:36 AM 0.3 mL 09/02/2020 Intramuscular   Manufacturer: [employer], [employer]   Lot: [id number]   NDC: [phone]

## 2021-01-17 DIAGNOSIS — M170 Bilateral primary osteoarthritis of knee: Secondary | ICD-10-CM | POA: Diagnosis not present

## 2021-01-17 DIAGNOSIS — M7122 Synovial cyst of popliteal space [Baker], left knee: Secondary | ICD-10-CM | POA: Diagnosis not present

## 2021-01-18 ENCOUNTER — Other Ambulatory Visit: Payer: Self-pay | Admitting: Internal Medicine

## 2021-01-18 DIAGNOSIS — N958 Other specified menopausal and perimenopausal disorders: Secondary | ICD-10-CM | POA: Diagnosis not present

## 2021-01-18 DIAGNOSIS — R928 Other abnormal and inconclusive findings on diagnostic imaging of breast: Secondary | ICD-10-CM

## 2021-01-19 ENCOUNTER — Other Ambulatory Visit: Payer: Self-pay | Admitting: Internal Medicine

## 2021-01-19 NOTE — Telephone Encounter (Signed)
Check [place] registry last filled 07/27/2020.Marland KitchenJohny Davenport

## 2021-02-08 ENCOUNTER — Ambulatory Visit
Admission: RE | Admit: 2021-02-08 | Discharge: 2021-02-08 | Disposition: A | Discharge status: Home / Self Care | Payer: Medicare Other | Source: Ambulatory Visit | Attending: Internal Medicine | Admitting: Internal Medicine

## 2021-02-08 ENCOUNTER — Other Ambulatory Visit: Payer: Self-pay

## 2021-02-08 ENCOUNTER — Other Ambulatory Visit: Payer: Self-pay | Admitting: Internal Medicine

## 2021-02-08 DIAGNOSIS — R922 Inconclusive mammogram: Secondary | ICD-10-CM | POA: Diagnosis not present

## 2021-02-08 DIAGNOSIS — R928 Other abnormal and inconclusive findings on diagnostic imaging of breast: Secondary | ICD-10-CM

## 2021-02-08 DIAGNOSIS — N6489 Other specified disorders of breast: Secondary | ICD-10-CM | POA: Diagnosis not present

## 2021-02-10 DIAGNOSIS — Z79899 Other long term (current) drug therapy: Secondary | ICD-10-CM | POA: Diagnosis not present

## 2021-02-10 DIAGNOSIS — Z961 Presence of intraocular lens: Secondary | ICD-10-CM | POA: Diagnosis not present

## 2021-02-14 ENCOUNTER — Ambulatory Visit
Admission: RE | Admit: 2021-02-14 | Discharge: 2021-02-14 | Disposition: A | Discharge status: Home / Self Care | Payer: Medicare Other | Source: Ambulatory Visit | Attending: Internal Medicine | Admitting: Internal Medicine

## 2021-02-14 ENCOUNTER — Other Ambulatory Visit: Payer: Self-pay

## 2021-02-14 DIAGNOSIS — R928 Other abnormal and inconclusive findings on diagnostic imaging of breast: Secondary | ICD-10-CM

## 2021-02-14 DIAGNOSIS — N6489 Other specified disorders of breast: Secondary | ICD-10-CM | POA: Diagnosis not present

## 2021-02-28 DIAGNOSIS — M170 Bilateral primary osteoarthritis of knee: Secondary | ICD-10-CM | POA: Diagnosis not present

## 2021-03-01 DIAGNOSIS — M2550 Pain in unspecified joint: Secondary | ICD-10-CM | POA: Diagnosis not present

## 2021-03-01 DIAGNOSIS — Z6833 Body mass index (BMI) 33.0-33.9, adult: Secondary | ICD-10-CM | POA: Diagnosis not present

## 2021-03-01 DIAGNOSIS — M0609 Rheumatoid arthritis without rheumatoid factor, multiple sites: Secondary | ICD-10-CM | POA: Diagnosis not present

## 2021-03-01 DIAGNOSIS — M150 Primary generalized (osteo)arthritis: Secondary | ICD-10-CM | POA: Diagnosis not present

## 2021-03-01 DIAGNOSIS — E669 Obesity, unspecified: Secondary | ICD-10-CM | POA: Diagnosis not present

## 2021-03-01 DIAGNOSIS — M310 Hypersensitivity angiitis: Secondary | ICD-10-CM | POA: Diagnosis not present

## 2021-03-01 DIAGNOSIS — Z79899 Other long term (current) drug therapy: Secondary | ICD-10-CM | POA: Diagnosis not present

## 2021-03-02 ENCOUNTER — Other Ambulatory Visit: Payer: Self-pay | Admitting: Internal Medicine

## 2021-03-10 ENCOUNTER — Ambulatory Visit: Payer: Medicare Other | Admitting: Internal Medicine

## 2021-03-14 ENCOUNTER — Other Ambulatory Visit: Payer: Self-pay

## 2021-03-14 ENCOUNTER — Encounter: Payer: Self-pay | Admitting: Internal Medicine

## 2021-03-14 ENCOUNTER — Ambulatory Visit (INDEPENDENT_AMBULATORY_CARE_PROVIDER_SITE_OTHER): Payer: Medicare Other | Admitting: Internal Medicine

## 2021-03-14 VITALS — BP 141/88 | HR 76 | Temp 97.8°F | Ht 60.5 in | Wt 171.4 lb

## 2021-03-14 DIAGNOSIS — E785 Hyperlipidemia, unspecified: Secondary | ICD-10-CM | POA: Diagnosis not present

## 2021-03-14 DIAGNOSIS — I10 Essential (primary) hypertension: Secondary | ICD-10-CM | POA: Diagnosis not present

## 2021-03-14 DIAGNOSIS — F410 Panic disorder [episodic paroxysmal anxiety] without agoraphobia: Secondary | ICD-10-CM

## 2021-03-14 DIAGNOSIS — M25562 Pain in left knee: Secondary | ICD-10-CM

## 2021-03-14 DIAGNOSIS — N6310 Unspecified lump in the right breast, unspecified quadrant: Secondary | ICD-10-CM

## 2021-03-14 DIAGNOSIS — G8929 Other chronic pain: Secondary | ICD-10-CM | POA: Diagnosis not present

## 2021-03-14 LAB — URINALYSIS
Bilirubin Urine: NEGATIVE
Hgb urine dipstick: NEGATIVE
Ketones, ur: NEGATIVE
Leukocytes,Ua: NEGATIVE
Nitrite: NEGATIVE
Specific Gravity, Urine: <=1.005 — AB (ref 1.000–1.030)
Total Protein, Urine: NEGATIVE
Urine Glucose: NEGATIVE
Urobilinogen, UA: 0.2 (ref 0.0–1.0)
pH: 7 (ref 5.0–8.0)

## 2021-03-14 LAB — COMPREHENSIVE METABOLIC PANEL
ALT: 21 U/L (ref 0–35)
AST: 20 U/L (ref 0–37)
Albumin: 4.6 g/dL (ref 3.5–5.2)
Alkaline Phosphatase: 51 U/L (ref 39–117)
BUN: 11 mg/dL (ref 6–23)
CO2: 28 mEq/L (ref 19–32)
Calcium: 9.7 mg/dL (ref 8.4–10.5)
Chloride: 102 mEq/L (ref 96–112)
Creatinine, Ser: 0.58 mg/dL (ref 0.40–1.20)
GFR: 92.77 mL/min (ref >60.00)
Glucose, Bld: 92 mg/dL (ref 70–99)
Potassium: 4.1 mEq/L (ref 3.5–5.1)
Sodium: 139 mEq/L (ref 135–145)
Total Bilirubin: 0.5 mg/dL (ref 0.2–1.2)
Total Protein: 7.4 g/dL (ref 6.0–8.3)

## 2021-03-14 LAB — LIPID PANEL
Cholesterol: 153 mg/dL (ref 0–200)
HDL: 82.3 mg/dL (ref >39.00)
LDL Cholesterol: 55 mg/dL (ref 0–99)
NonHDL: 70.68
Total CHOL/HDL Ratio: 2
Triglycerides: 80 mg/dL (ref 0.0–149.0)
VLDL: 16 mg/dL (ref 0.0–40.0)

## 2021-03-14 LAB — CBC WITH DIFFERENTIAL/PLATELET
Basophils Absolute: 0 10*3/uL (ref 0.0–0.1)
Basophils Relative: 0.9 % (ref 0.0–3.0)
Eosinophils Absolute: 0.2 10*3/uL (ref 0.0–0.7)
Eosinophils Relative: 3.9 % (ref 0.0–5.0)
HCT: 38.4 % (ref 36.0–46.0)
Hemoglobin: 13 g/dL (ref 12.0–15.0)
Lymphocytes Relative: 32.7 % (ref 12.0–46.0)
Lymphs Abs: 1.8 10*3/uL (ref 0.7–4.0)
MCHC: 33.8 g/dL (ref 30.0–36.0)
MCV: 86.2 fl (ref 78.0–100.0)
Monocytes Absolute: 0.4 10*3/uL (ref 0.1–1.0)
Monocytes Relative: 7.9 % (ref 3.0–12.0)
Neutro Abs: 3 10*3/uL (ref 1.4–7.7)
Neutrophils Relative %: 54.6 % (ref 43.0–77.0)
Platelets: 241 10*3/uL (ref 150.0–400.0)
RBC: 4.45 Mil/uL (ref 3.87–5.11)
RDW: 14 % (ref 11.5–15.5)
WBC: 5.6 10*3/uL (ref 4.0–10.5)

## 2021-03-14 LAB — TSH: TSH: 1.63 u[IU]/mL (ref 0.35–4.50)

## 2021-03-14 MED ORDER — TRAMADOL HCL 50 MG PO TABS: 25.0000 mg | ORAL_TABLET | Freq: Four times a day (QID) | ORAL | 0 refills | Status: DC | PRN

## 2021-03-14 MED ORDER — AMLODIPINE BESYLATE 5 MG PO TABS: 5.0000 mg | ORAL_TABLET | Freq: Every day | ORAL | 3 refills | Status: DC

## 2021-03-14 NOTE — Assessment & Plan Note (Signed)
On a low dose Cymbalta OK w/a low dose Tramadol

## 2021-03-14 NOTE — Assessment & Plan Note (Signed)
Worse Seeing Dr Theda Sers for the knee pain Will try low dose Tramadol if tolerated  Potential benefits of a long term opioids use as well as potential risks (i.e. addiction risk, apnea etc) and complications (i.e. Somnolence, constipation and others) were explained to the patient and were aknowledged.

## 2021-03-14 NOTE — Assessment & Plan Note (Signed)
S/p bx Surg appt is pending

## 2021-03-14 NOTE — Progress Notes (Signed)
Subjective:  Patient ID: Jennifer Davenport, female    DOB: [birth year]  Age: 70 y.o. MRN: [id number]  CC: Follow-up (6 month f/u)   HPI Khaliya Golinski presents for L knee pain - severe at times, depression, anxiety; breast distortion R Seeing Dr Theda Sers for the knee pain  Outpatient Medications Prior to Visit  Medication Sig Dispense Refill   acetaminophen (TYLENOL) 500 MG tablet Take 500 mg by mouth every 6 (six) hours as needed.     aspirin 81 MG EC tablet Take 1 by mouth daily     atorvastatin (LIPITOR) 80 MG tablet TAKE ONE TABLET BY MOUTH DAILY AT 6PM 90 tablet 3   Azelaic Acid 15 % cream Apply topically 1 day or 1 dose. After skin is thoroughly washed and patted dry, gently but thoroughly massage a thin film of azelaic acid cream into the affected area twice daily, in the morning and evening.     b complex vitamins tablet Take 1 tablet by mouth daily.     Cholecalciferol (VITAMIN D) 50 MCG (2000 UT) tablet      diclofenac Sodium (VOLTAREN) 1 % GEL Apply 1 application topically 4 (four) times daily. 100 g 3   dicyclomine (BENTYL) 10 MG capsule TAKE 1 CAPSULE EVERY 6-8 HOURS AS NEEDED FOR CRAMPING AND SPASMS 90 capsule 1   DULoxetine (CYMBALTA) 20 MG capsule TAKE ONE CAPSULE BY MOUTH DAILY 90 capsule 3   fluticasone (FLONASE) 50 MCG/ACT nasal spray Place 1-2 sprays into both nostrils daily.      furosemide (LASIX) 20 MG tablet TAKE 1/2 TO 1 TABLET BY MOUTH DAILY AS NEEDED FOR EDEMA 30 tablet 5   guaiFENesin (MUCINEX) 600 MG 12 hr tablet Take 2 tablets (1,200 mg total) by mouth 2 (two) times daily as needed. 60 tablet 0   hydroxychloroquine (PLAQUENIL) 200 MG tablet Take 200 mg by mouth daily. Take 1 tablet a day     Loratadine 10 MG CAPS Take 1 by mouth daily as needed     LORazepam (ATIVAN) 1 MG tablet TAKE 1/2 TO 1 TABLET BY MOUTH TWO TIMES A DAY AS NEEDED FOR ANXIETY 30 tablet 2   losartan (COZAAR) 100 MG tablet Take 1 tablet (100 mg total) by mouth daily. 90 tablet 3    omeprazole (PRILOSEC) 10 MG capsule      triamcinolone cream (KENALOG) 0.1 %      amLODipine (NORVASC) 5 MG tablet Take 1 tablet (5 mg total) by mouth daily. 90 tablet 3   COVID-19 mRNA Vac-TriS, Pfizer, SUSP injection Inject into the muscle. (Patient not taking: Reported on 03/14/2021) 0.3 mL 0   No facility-administered medications prior to visit.    ROS: Review of Systems  Constitutional:  Positive for unexpected weight change. Negative for activity change, appetite change, chills and fatigue.  HENT:  Negative for congestion, mouth sores and sinus pressure.   Eyes:  Negative for visual disturbance.  Respiratory:  Negative for cough and chest tightness.   Gastrointestinal:  Negative for abdominal pain and nausea.  Genitourinary:  Negative for difficulty urinating, frequency and vaginal pain.  Musculoskeletal:  Positive for arthralgias and gait problem. Negative for back pain.  Skin:  Negative for pallor and rash.  Neurological:  Negative for dizziness, tremors, weakness, numbness and headaches.  Psychiatric/Behavioral:  Negative for confusion and sleep disturbance.    Objective:  BP (!) 141/88 (BP Location: Left Arm)   Pulse 76   Temp 97.8 F (36.6 C) (Oral)  Ht 5' 0.5" (1.537 m)   Wt 171 lb 6.4 oz (77.7 kg)   LMP 11/24/1982   SpO2 97%   BMI 32.92 kg/m   BP Readings from Last 3 Encounters:  03/14/21 (!) 141/88  10/18/20 140/76  09/09/20 (!) 152/80    Wt Readings from Last 3 Encounters:  03/14/21 171 lb 6.4 oz (77.7 kg)  10/18/20 168 lb 3.2 oz (76.3 kg)  09/09/20 168 lb (76.2 kg)    Physical Exam Constitutional:      General: She is not in acute distress.    Appearance: She is well-developed. She is obese.  HENT:     Head: Normocephalic.     Right Ear: External ear normal.     Left Ear: External ear normal.     Nose: Nose normal.  Eyes:     General:        Right eye: No discharge.        Left eye: No discharge.     Conjunctiva/sclera: Conjunctivae normal.      Pupils: Pupils are equal, round, and reactive to light.  Neck:     Thyroid: No thyromegaly.     Vascular: No JVD.     Trachea: No tracheal deviation.  Cardiovascular:     Rate and Rhythm: Normal rate and regular rhythm.     Heart sounds: Normal heart sounds.  Pulmonary:     Effort: No respiratory distress.     Breath sounds: No stridor. No wheezing.  Abdominal:     General: Bowel sounds are normal. There is no distension.     Palpations: Abdomen is soft. There is no mass.     Tenderness: There is abdominal tenderness. There is no guarding or rebound.  Musculoskeletal:        General: No tenderness.     Cervical back: Normal range of motion and neck supple.  Lymphadenopathy:     Cervical: No cervical adenopathy.  Skin:    Findings: No erythema or rash.  Neurological:     Mental Status: She is oriented to person, place, and time.     Cranial Nerves: No cranial nerve deficit.     Motor: No abnormal muscle tone.     Coordination: Coordination normal.     Deep Tendon Reflexes: Reflexes normal.  Psychiatric:        Behavior: Behavior normal.        Thought Content: Thought content normal.        Judgment: Judgment normal.   L knee is painful  Lab Results  Component Value Date   WBC 5.8 09/09/2020   HGB 12.6 09/09/2020   HCT 38.5 09/09/2020   PLT 310.0 09/09/2020   GLUCOSE 86 09/09/2020   CHOL 155 03/10/2020   TRIG 127.0 03/10/2020   HDL 74.80 03/10/2020   LDLCALC 55 03/10/2020   ALT 20 09/09/2020   AST 19 09/09/2020   NA 138 09/09/2020   K 4.3 09/09/2020   CL 104 09/09/2020   CREATININE 0.60 09/09/2020   BUN 11 09/09/2020   CO2 28 09/09/2020   TSH 1.48 03/10/2020   HGBA1C 6.0 02/20/2017    MM CLIP PLACEMENT RIGHT  Result Date: 02/14/2021 CLINICAL DATA:  Post stereotactic guided biopsy of distortion in the superior right breast at the 12 o'clock position. EXAM: DIAGNOSTIC RIGHT MAMMOGRAM POST STEREOTACTIC BIOPSY COMPARISON:  Previous exams. FINDINGS:  Mammographic images were obtained following stereotactic guided biopsy of distortion in the superior right breast. A coil shaped biopsy marking clip is  present and felt to be at the site of the biopsied distortion in the superior right breast, however the distortion cannot be adequately visualized due to the presence of a moderate sized post biopsy hematoma. IMPRESSION: Coil shaped biopsy marking clip felt to be at site of biopsied distortion in the superior right breast, however the distortion cannot be adequately visualized due to the presence of a moderate size post biopsy hematoma. Final Assessment: Post Procedure Mammograms for Marker Placement Electronically Signed   By: Everlean Alstrom M.D.   On: 02/14/2021 08:20   MM RT BREAST BX W LOC DEV 1ST LESION IMAGE BX SPEC STEREO GUIDE  Addendum Date: 02/15/2021   ADDENDUM REPORT: 02/15/2021 12:48 ADDENDUM: Pathology revealed COMPLEX SCLEROSING LESION of the RIGHT breast, superior, 12 o'clock position (coil clip). This was found to be concordant by Dr. Everlean Alstrom, with excision recommended. Pathology results were discussed with the patient by telephone. The patient reported doing well after the biopsy with tenderness at the site. Post biopsy instructions and care were reviewed and questions were answered. The patient was encouraged to call [facility] for any additional concerns. Surgical consultation has been arranged with Dr. Autumn Messing at [facility] on March 22, 2021. Pathology results reported by Stacie Acres RN on 02/15/2021. Electronically Signed   By: Everlean Alstrom M.D.   On: 02/15/2021 12:48   Result Date: 02/15/2021 CLINICAL DATA:  70 year old female with suspicious distortion in the superior right breast at the 12 o'clock position. EXAM: RIGHT BREAST STEREOTACTIC CORE NEEDLE BIOPSY COMPARISON:  Previous exams. FINDINGS: The patient and I discussed the procedure of stereotactic-guided biopsy including  benefits and alternatives. We discussed the high likelihood of a successful procedure. We discussed the risks of the procedure including infection, bleeding, tissue injury, clip migration, and inadequate sampling. Informed written consent was given. The usual time out protocol was performed immediately prior to the procedure. Using sterile technique and 1% Lidocaine as local anesthetic, under stereotactic guidance, a 9 gauge vacuum assisted device was used to perform core needle biopsy of the distortion in the superior right breast using a superior to inferior approach. Lesion quadrant: Upper inner At the conclusion of the procedure, a coil shaped tissue marker clip was deployed into the biopsy cavity. Follow-up 2-view mammogram was performed and dictated separately. IMPRESSION: Stereotactic-guided biopsy of the distortion in the superior/12 o'clock position of the right breast. No apparent complications. Electronically Signed: By: Everlean Alstrom M.D. On: 02/14/2021 08:06    Assessment & Plan:    Walker Kehr, MD

## 2021-03-14 NOTE — Assessment & Plan Note (Signed)
Cont w/Amlodipine, Losartan

## 2021-03-14 NOTE — Addendum Note (Signed)
Addended by: Jacobo Forest on: 03/14/2021 09:26 AM   Modules accepted: Orders

## 2021-03-22 ENCOUNTER — Ambulatory Visit: Payer: Self-pay | Admitting: General Surgery

## 2021-03-22 DIAGNOSIS — N6489 Other specified disorders of breast: Secondary | ICD-10-CM | POA: Diagnosis not present

## 2021-03-23 ENCOUNTER — Other Ambulatory Visit: Payer: Self-pay | Admitting: General Surgery

## 2021-03-23 DIAGNOSIS — N6489 Other specified disorders of breast: Secondary | ICD-10-CM

## 2021-03-26 DIAGNOSIS — Z20822 Contact with and (suspected) exposure to covid-19: Secondary | ICD-10-CM | POA: Diagnosis not present

## 2021-03-31 DIAGNOSIS — M170 Bilateral primary osteoarthritis of knee: Secondary | ICD-10-CM | POA: Diagnosis not present

## 2021-04-07 DIAGNOSIS — M170 Bilateral primary osteoarthritis of knee: Secondary | ICD-10-CM | POA: Diagnosis not present

## 2021-04-09 ENCOUNTER — Other Ambulatory Visit: Payer: Self-pay | Admitting: Internal Medicine

## 2021-04-12 DIAGNOSIS — Z20822 Contact with and (suspected) exposure to covid-19: Secondary | ICD-10-CM | POA: Diagnosis not present

## 2021-04-14 DIAGNOSIS — M170 Bilateral primary osteoarthritis of knee: Secondary | ICD-10-CM | POA: Diagnosis not present

## 2021-04-16 ENCOUNTER — Other Ambulatory Visit: Payer: Self-pay | Admitting: Internal Medicine

## 2021-04-21 ENCOUNTER — Other Ambulatory Visit: Payer: Self-pay

## 2021-04-21 ENCOUNTER — Encounter (HOSPITAL_BASED_OUTPATIENT_CLINIC_OR_DEPARTMENT_OTHER): Payer: Self-pay | Admitting: General Surgery

## 2021-04-22 ENCOUNTER — Encounter (HOSPITAL_BASED_OUTPATIENT_CLINIC_OR_DEPARTMENT_OTHER)
Admission: RE | Admit: 2021-04-22 | Discharge: 2021-04-22 | Disposition: A | Discharge status: Home / Self Care | Payer: Medicare Other | Source: Ambulatory Visit | Attending: General Surgery | Admitting: General Surgery

## 2021-04-22 DIAGNOSIS — Z01818 Encounter for other preprocedural examination: Secondary | ICD-10-CM | POA: Insufficient documentation

## 2021-04-22 LAB — BASIC METABOLIC PANEL
Anion gap: 6 (ref 5–15)
BUN: 8 mg/dL (ref 8–23)
CO2: 27 mmol/L (ref 22–32)
Calcium: 9.2 mg/dL (ref 8.9–10.3)
Chloride: 104 mmol/L (ref 98–111)
Creatinine, Ser: 0.58 mg/dL (ref 0.44–1.00)
GFR, Estimated: >60 mL/min (ref >60)
Glucose, Bld: 89 mg/dL (ref 70–99)
Potassium: 4.8 mmol/L (ref 3.5–5.1)
Sodium: 137 mmol/L (ref 135–145)

## 2021-04-22 NOTE — Progress Notes (Signed)

## 2021-04-28 ENCOUNTER — Other Ambulatory Visit: Payer: Self-pay

## 2021-04-28 ENCOUNTER — Ambulatory Visit
Admission: RE | Admit: 2021-04-28 | Discharge: 2021-04-28 | Disposition: A | Discharge status: Home / Self Care | Payer: Medicare Other | Source: Ambulatory Visit | Attending: General Surgery | Admitting: General Surgery

## 2021-04-28 DIAGNOSIS — R928 Other abnormal and inconclusive findings on diagnostic imaging of breast: Secondary | ICD-10-CM | POA: Diagnosis not present

## 2021-04-28 DIAGNOSIS — N6489 Other specified disorders of breast: Secondary | ICD-10-CM

## 2021-04-29 ENCOUNTER — Other Ambulatory Visit: Payer: Self-pay

## 2021-04-29 ENCOUNTER — Ambulatory Visit
Admission: RE | Admit: 2021-04-29 | Discharge: 2021-04-29 | Disposition: A | Discharge status: Home / Self Care | Payer: Medicare Other | Source: Ambulatory Visit | Attending: General Surgery | Admitting: General Surgery

## 2021-04-29 ENCOUNTER — Ambulatory Visit (HOSPITAL_BASED_OUTPATIENT_CLINIC_OR_DEPARTMENT_OTHER): Payer: Medicare Other | Admitting: Anesthesiology

## 2021-04-29 ENCOUNTER — Ambulatory Visit (HOSPITAL_BASED_OUTPATIENT_CLINIC_OR_DEPARTMENT_OTHER)
Admission: RE | Admit: 2021-04-29 | Discharge: 2021-04-29 | Disposition: A | Discharge status: Home / Self Care | Payer: Medicare Other | Attending: General Surgery | Admitting: General Surgery

## 2021-04-29 ENCOUNTER — Encounter (HOSPITAL_BASED_OUTPATIENT_CLINIC_OR_DEPARTMENT_OTHER): Payer: Self-pay | Admitting: General Surgery

## 2021-04-29 ENCOUNTER — Encounter (HOSPITAL_BASED_OUTPATIENT_CLINIC_OR_DEPARTMENT_OTHER)
Admission: RE | Disposition: A | Discharge status: Home / Self Care | Payer: Self-pay | Source: Home / Self Care | Attending: General Surgery

## 2021-04-29 DIAGNOSIS — N6489 Other specified disorders of breast: Secondary | ICD-10-CM | POA: Diagnosis not present

## 2021-04-29 DIAGNOSIS — Z90711 Acquired absence of uterus with remaining cervical stump: Secondary | ICD-10-CM | POA: Diagnosis not present

## 2021-04-29 DIAGNOSIS — Z9049 Acquired absence of other specified parts of digestive tract: Secondary | ICD-10-CM | POA: Insufficient documentation

## 2021-04-29 DIAGNOSIS — I10 Essential (primary) hypertension: Secondary | ICD-10-CM | POA: Diagnosis not present

## 2021-04-29 DIAGNOSIS — Z881 Allergy status to other antibiotic agents status: Secondary | ICD-10-CM | POA: Insufficient documentation

## 2021-04-29 DIAGNOSIS — K219 Gastro-esophageal reflux disease without esophagitis: Secondary | ICD-10-CM | POA: Diagnosis not present

## 2021-04-29 DIAGNOSIS — Z86010 Personal history of colonic polyps: Secondary | ICD-10-CM | POA: Diagnosis not present

## 2021-04-29 DIAGNOSIS — Z98891 History of uterine scar from previous surgery: Secondary | ICD-10-CM | POA: Insufficient documentation

## 2021-04-29 DIAGNOSIS — R928 Other abnormal and inconclusive findings on diagnostic imaging of breast: Secondary | ICD-10-CM | POA: Diagnosis not present

## 2021-04-29 DIAGNOSIS — E785 Hyperlipidemia, unspecified: Secondary | ICD-10-CM | POA: Diagnosis not present

## 2021-04-29 DIAGNOSIS — N6011 Diffuse cystic mastopathy of right breast: Secondary | ICD-10-CM | POA: Diagnosis not present

## 2021-04-29 DIAGNOSIS — Z803 Family history of malignant neoplasm of breast: Secondary | ICD-10-CM | POA: Insufficient documentation

## 2021-04-29 DIAGNOSIS — G4730 Sleep apnea, unspecified: Secondary | ICD-10-CM | POA: Insufficient documentation

## 2021-04-29 DIAGNOSIS — N6081 Other benign mammary dysplasias of right breast: Secondary | ICD-10-CM | POA: Diagnosis not present

## 2021-04-29 DIAGNOSIS — Z8249 Family history of ischemic heart disease and other diseases of the circulatory system: Secondary | ICD-10-CM | POA: Insufficient documentation

## 2021-04-29 DIAGNOSIS — E7800 Pure hypercholesterolemia, unspecified: Secondary | ICD-10-CM | POA: Diagnosis not present

## 2021-04-29 HISTORY — PX: BREAST LUMPECTOMY WITH RADIOACTIVE SEED LOCALIZATION: SHX6424

## 2021-04-29 SURGERY — BREAST LUMPECTOMY WITH RADIOACTIVE SEED LOCALIZATION
Anesthesia: General | Site: Breast | Laterality: Right

## 2021-04-29 MED ORDER — CHLORHEXIDINE GLUCONATE CLOTH 2 % EX PADS: 6.0000 | MEDICATED_PAD | Freq: Once | CUTANEOUS | Status: DC

## 2021-04-29 MED ORDER — VANCOMYCIN HCL IN DEXTROSE 1-5 GM/200ML-% IV SOLN
1000.0000 mg | INTRAVENOUS | Status: AC
  Administered 2021-04-29: 1000 mg via INTRAVENOUS

## 2021-04-29 MED ORDER — PHENYLEPHRINE 40 MCG/ML (10ML) SYRINGE FOR IV PUSH (FOR BLOOD PRESSURE SUPPORT)
PREFILLED_SYRINGE | INTRAVENOUS | Status: AC
  Filled 2021-04-29: qty 10

## 2021-04-29 MED ORDER — ONDANSETRON HCL 4 MG/2ML IJ SOLN
INTRAMUSCULAR | Status: AC
  Filled 2021-04-29: qty 2

## 2021-04-29 MED ORDER — LACTATED RINGERS IV SOLN: INTRAVENOUS | Status: DC

## 2021-04-29 MED ORDER — MEPERIDINE HCL 25 MG/ML IJ SOLN: 6.2500 mg | INTRAMUSCULAR | Status: DC | PRN

## 2021-04-29 MED ORDER — SUCCINYLCHOLINE CHLORIDE 200 MG/10ML IV SOSY
PREFILLED_SYRINGE | INTRAVENOUS | Status: AC
  Filled 2021-04-29: qty 10

## 2021-04-29 MED ORDER — OXYCODONE HCL 5 MG/5ML PO SOLN: 5.0000 mg | Freq: Once | ORAL | Status: DC | PRN

## 2021-04-29 MED ORDER — ONDANSETRON HCL 4 MG/2ML IJ SOLN
INTRAMUSCULAR | Status: DC | PRN
  Administered 2021-04-29: 4 mg via INTRAVENOUS

## 2021-04-29 MED ORDER — LIDOCAINE HCL (CARDIAC) PF 100 MG/5ML IV SOSY
PREFILLED_SYRINGE | INTRAVENOUS | Status: DC | PRN
  Administered 2021-04-29: 50 mg via INTRAVENOUS

## 2021-04-29 MED ORDER — DEXAMETHASONE SODIUM PHOSPHATE 10 MG/ML IJ SOLN
INTRAMUSCULAR | Status: AC
  Filled 2021-04-29: qty 1

## 2021-04-29 MED ORDER — BUPIVACAINE-EPINEPHRINE (PF) 0.25% -1:200000 IJ SOLN
INTRAMUSCULAR | Status: DC | PRN
  Administered 2021-04-29: 20 mL

## 2021-04-29 MED ORDER — TRAMADOL HCL 50 MG PO TABS: 50.0000 mg | ORAL_TABLET | Freq: Four times a day (QID) | ORAL | 0 refills | Status: AC | PRN

## 2021-04-29 MED ORDER — VANCOMYCIN HCL IN DEXTROSE 1-5 GM/200ML-% IV SOLN
INTRAVENOUS | Status: AC
  Filled 2021-04-29: qty 200

## 2021-04-29 MED ORDER — PROMETHAZINE HCL 25 MG/ML IJ SOLN: 6.2500 mg | INTRAMUSCULAR | Status: DC | PRN

## 2021-04-29 MED ORDER — SCOPOLAMINE 1 MG/3DAYS TD PT72
1.0000 | MEDICATED_PATCH | TRANSDERMAL | Status: DC
  Administered 2021-04-29: 1.5 mg via TRANSDERMAL

## 2021-04-29 MED ORDER — LIDOCAINE HCL (PF) 2 % IJ SOLN
INTRAMUSCULAR | Status: AC
  Filled 2021-04-29: qty 5

## 2021-04-29 MED ORDER — ACETAMINOPHEN 500 MG PO TABS
1000.0000 mg | ORAL_TABLET | ORAL | Status: AC
Start: 2021-04-29 — End: 2021-04-29
  Administered 2021-04-29: 1000 mg via ORAL

## 2021-04-29 MED ORDER — OXYCODONE HCL 5 MG PO TABS: 5.0000 mg | ORAL_TABLET | Freq: Once | ORAL | Status: DC | PRN

## 2021-04-29 MED ORDER — FENTANYL CITRATE (PF) 100 MCG/2ML IJ SOLN
INTRAMUSCULAR | Status: DC | PRN
  Administered 2021-04-29 (×2): 50 ug via INTRAVENOUS

## 2021-04-29 MED ORDER — ACETAMINOPHEN 500 MG PO TABS
ORAL_TABLET | ORAL | Status: AC
  Filled 2021-04-29: qty 2

## 2021-04-29 MED ORDER — GABAPENTIN 300 MG PO CAPS
300.0000 mg | ORAL_CAPSULE | ORAL | Status: AC
Start: 2021-04-29 — End: 2021-04-29
  Administered 2021-04-29: 300 mg via ORAL

## 2021-04-29 MED ORDER — DEXAMETHASONE SODIUM PHOSPHATE 4 MG/ML IJ SOLN
INTRAMUSCULAR | Status: DC | PRN
  Administered 2021-04-29: 10 mg via INTRAVENOUS

## 2021-04-29 MED ORDER — PROPOFOL 10 MG/ML IV BOLUS
INTRAVENOUS | Status: DC | PRN
Start: 2021-04-29 — End: 2021-04-29
  Administered 2021-04-29: 150 mg via INTRAVENOUS

## 2021-04-29 MED ORDER — FENTANYL CITRATE (PF) 100 MCG/2ML IJ SOLN
INTRAMUSCULAR | Status: AC
  Filled 2021-04-29: qty 2

## 2021-04-29 MED ORDER — MIDAZOLAM HCL 5 MG/5ML IJ SOLN
INTRAMUSCULAR | Status: DC | PRN
  Administered 2021-04-29 (×2): 1 mg via INTRAVENOUS

## 2021-04-29 MED ORDER — MIDAZOLAM HCL 2 MG/2ML IJ SOLN
INTRAMUSCULAR | Status: AC
  Filled 2021-04-29: qty 2

## 2021-04-29 MED ORDER — FENTANYL CITRATE (PF) 100 MCG/2ML IJ SOLN: 25.0000 ug | INTRAMUSCULAR | Status: DC | PRN

## 2021-04-29 MED ORDER — BUPIVACAINE-EPINEPHRINE (PF) 0.25% -1:200000 IJ SOLN
INTRAMUSCULAR | Status: AC
  Filled 2021-04-29: qty 30

## 2021-04-29 MED ORDER — GABAPENTIN 300 MG PO CAPS
ORAL_CAPSULE | ORAL | Status: AC
  Filled 2021-04-29: qty 1

## 2021-04-29 MED ORDER — SCOPOLAMINE 1 MG/3DAYS TD PT72
MEDICATED_PATCH | TRANSDERMAL | Status: AC
  Filled 2021-04-29: qty 1

## 2021-04-29 MED ORDER — EPHEDRINE 5 MG/ML INJ
INTRAVENOUS | Status: AC
  Filled 2021-04-29: qty 10

## 2021-04-29 MED ORDER — ACETAMINOPHEN 500 MG PO TABS: 1000.0000 mg | ORAL_TABLET | Freq: Once | ORAL | Status: DC

## 2021-04-29 SURGICAL SUPPLY — 46 items
ADH SKN CLS APL DERMABOND .7 (GAUZE/BANDAGES/DRESSINGS) ×1
APL PRP STRL LF DISP 70% ISPRP (MISCELLANEOUS) ×1
APPLIER CLIP 9.375 MED OPEN (MISCELLANEOUS)
APR CLP MED 9.3 20 MLT OPN (MISCELLANEOUS)
BLADE SURG 15 STRL LF DISP TIS (BLADE) ×1 IMPLANT
BLADE SURG 15 STRL SS (BLADE) ×2
CANISTER SUC SOCK COL 7IN (MISCELLANEOUS) ×2 IMPLANT
CANISTER SUCT 1200ML W/VALVE (MISCELLANEOUS) ×2 IMPLANT
CHLORAPREP W/TINT 26 (MISCELLANEOUS) ×2 IMPLANT
CLIP APPLIE 9.375 MED OPEN (MISCELLANEOUS) IMPLANT
COVER BACK TABLE 60X90IN (DRAPES) ×2 IMPLANT
COVER MAYO STAND STRL (DRAPES) ×2 IMPLANT
COVER PROBE W GEL 5X96 (DRAPES) ×2 IMPLANT
DECANTER SPIKE VIAL GLASS SM (MISCELLANEOUS) IMPLANT
DERMABOND ADVANCED (GAUZE/BANDAGES/DRESSINGS) ×1
DERMABOND ADVANCED .7 DNX12 (GAUZE/BANDAGES/DRESSINGS) ×1 IMPLANT
DRAPE LAPAROSCOPIC ABDOMINAL (DRAPES) ×2 IMPLANT
DRAPE UTILITY XL STRL (DRAPES) ×2 IMPLANT
ELECT COATED BLADE 2.86 ST (ELECTRODE) ×2 IMPLANT
ELECT REM PT RETURN 9FT ADLT (ELECTROSURGICAL) ×2
ELECTRODE REM PT RTRN 9FT ADLT (ELECTROSURGICAL) ×1 IMPLANT
GLOVE SURG ENC MOIS LTX SZ6.5 (GLOVE) ×2 IMPLANT
GLOVE SURG ENC MOIS LTX SZ7.5 (GLOVE) ×4 IMPLANT
GLOVE SURG POLYISO LF SZ6.5 (GLOVE) ×2 IMPLANT
GLOVE SURG POLYISO LF SZ7 (GLOVE) ×2 IMPLANT
GLOVE SURG UNDER POLY LF SZ6.5 (GLOVE) ×2 IMPLANT
GLOVE SURG UNDER POLY LF SZ7 (GLOVE) ×2 IMPLANT
GOWN STRL REUS W/ TWL LRG LVL3 (GOWN DISPOSABLE) ×4 IMPLANT
GOWN STRL REUS W/TWL LRG LVL3 (GOWN DISPOSABLE) ×8
ILLUMINATOR WAVEGUIDE N/F (MISCELLANEOUS) IMPLANT
KIT MARKER MARGIN INK (KITS) ×2 IMPLANT
LIGHT WAVEGUIDE WIDE FLAT (MISCELLANEOUS) IMPLANT
NEEDLE HYPO 25X1 1.5 SAFETY (NEEDLE) IMPLANT
NS IRRIG 1000ML POUR BTL (IV SOLUTION) ×2 IMPLANT
PACK BASIN DAY SURGERY FS (CUSTOM PROCEDURE TRAY) ×2 IMPLANT
PENCIL SMOKE EVACUATOR (MISCELLANEOUS) ×2 IMPLANT
SLEEVE SCD COMPRESS KNEE MED (STOCKING) ×2 IMPLANT
SPONGE T-LAP 18X18 ~~LOC~~+RFID (SPONGE) ×2 IMPLANT
SUT MON AB 4-0 PC3 18 (SUTURE) ×2 IMPLANT
SUT SILK 2 0 SH (SUTURE) IMPLANT
SUT VICRYL 3-0 CR8 SH (SUTURE) ×2 IMPLANT
SYR CONTROL 10ML LL (SYRINGE) ×2 IMPLANT
TOWEL GREEN STERILE FF (TOWEL DISPOSABLE) ×2 IMPLANT
TRAY FAXITRON CT DISP (TRAY / TRAY PROCEDURE) ×2 IMPLANT
TUBE CONNECTING 20X1/4 (TUBING) ×2 IMPLANT
YANKAUER SUCT BULB TIP NO VENT (SUCTIONS) ×2 IMPLANT

## 2021-04-29 NOTE — Interval H&P Note (Signed)
History and Physical Interval Note:  04/29/2021 11:31 AM  Jennifer Davenport  has presented today for surgery, with the diagnosis of RIGHT BREAST CSL.  The various methods of treatment have been discussed with the patient and family. After consideration of risks, benefits and other options for treatment, the patient has consented to  Procedure(s): RIGHT BREAST LUMPECTOMY WITH RADIOACTIVE SEED LOCALIZATION (Right) as a surgical intervention.  The patient's history has been reviewed, patient examined, no change in status, stable for surgery.  I have reviewed the patient's chart and labs.  Questions were answered to the patient's satisfaction.     Autumn Messing III

## 2021-04-29 NOTE — Op Note (Signed)
04/29/2021  12:25 PM  PATIENT:  Jennifer Davenport  70 y.o. female  PRE-OPERATIVE DIAGNOSIS:  RIGHT BREAST COMPLEX SCLEROSING LESION  POST-OPERATIVE DIAGNOSIS:  RIGHT BREAST COMPLEX SCLEROSING LESION  PROCEDURE:  Procedure(s): RIGHT BREAST LUMPECTOMY WITH RADIOACTIVE SEED LOCALIZATION (Right)  SURGEON:  Surgeon(s) and Role:    Jovita Kussmaul, MD - Primary  PHYSICIAN ASSISTANT:   ASSISTANTS: none   ANESTHESIA:   local and general  EBL:  10 mL   BLOOD ADMINISTERED:none  DRAINS: none   LOCAL MEDICATIONS USED:  MARCAINE     SPECIMEN:  Source of Specimen:  right breast tissue  DISPOSITION OF SPECIMEN:  PATHOLOGY  COUNTS:  YES  TOURNIQUET:  * No tourniquets in log *  DICTATION: .Dragon Dictation  After informed consent was obtained the patient was brought to the operating room and placed in the supine position on the operating table.  After adequate induction of general anesthesia the patient's right breast was prepped with ChloraPrep, allowed to dry, and draped in usual sterile manner.  An appropriate timeout was performed.  Previously an I-125 seed was placed in the upper portion of the right breast to mark an area of complex sclerosing lesion.  The neoprobe was set to I-125 in the area of radioactivity was readily identified.  The area around this was infiltrated with quarter percent Marcaine.  A curvilinear incision was made along the upper edge of the areola of the right breast with a 15 blade knife.  The incision was carried through the skin and subcutaneous tissue sharply with the electrocautery.  Dissection was then carried towards the radioactive seed under the direction of the neoprobe.  Once I more closely approached the radioactive seed I then removed a circular portion of breast tissue sharply with the electrocautery around the radioactive seed while checking the area of radioactivity frequently.  Once the specimen was removed it was oriented with the appropriate  paint colors.  A specimen radiograph was obtained that showed the clip and seed to be near the center of the specimen.  The specimen was then sent to pathology for further evaluation.  Hemostasis was achieved using the Bovie electrocautery.  The wound was irrigated with saline and infiltrated with more quarter percent Marcaine.  The deep layer of the wound was then closed with layers of interrupted 3-0 Vicryl stitches.  The skin was closed with interrupted 4-0 Monocryl subcuticular stitches.  Dermabond dressings were applied.  The patient tolerated the procedure well.  At the end of the case all needle sponge and instrument counts were correct.  The patient was then awakened and taken to recovery in stable condition.  PLAN OF CARE: Discharge to home after PACU  PATIENT DISPOSITION:  PACU - hemodynamically stable.   Delay start of Pharmacological VTE agent (>24hrs) due to surgical blood loss or risk of bleeding: not applicable

## 2021-04-29 NOTE — Anesthesia Procedure Notes (Signed)
Procedure Name: LMA Insertion Date/Time: 04/29/2021 11:46 AM Performed by: Willa Frater, CRNA Pre-anesthesia Checklist: Patient identified, Emergency Drugs available, Suction available and Patient being monitored Patient Re-evaluated:Patient Re-evaluated prior to induction Oxygen Delivery Method: Circle system utilized Preoxygenation: Pre-oxygenation with 100% oxygen Induction Type: IV induction Ventilation: Mask ventilation without difficulty LMA: LMA inserted LMA Size: 3.0 Number of attempts: 1 Airway Equipment and Method: Bite block Placement Confirmation: positive ETCO2 Tube secured with: Tape Dental Injury: Teeth and Oropharynx as per pre-operative assessment

## 2021-04-29 NOTE — H&P (Signed)
Jennifer Davenport  Location: [facility] Patient #: [id number] DOB: 1951/03/22 Married / Language: English / Race: White Female   History of Present Illness  The patient is a 70 year old female who presents with a breast mass. We are asked to see the patient in consultation by Dr. Alain Marion to evaluate her for a complex sclerosing lesion in the right breast.  The patient is a 70 year old white female who recently went for a routine screening mammogram.  At that time she was found to have a small distortion in the upper portion of the right breast.  The lymph nodes looked normal.  The distortion was biopsied and came back as a complex sclerosing lesion.  She does have a history of breast cancer in her mother and maternal cousin.  She is otherwise in pretty good health and does not smoke.   Past Surgical History  Appendectomy   Breast Biopsy   Right. Cesarean Section - 1   Colon Polyp Removal - Colonoscopy   Foot Surgery   Bilateral. Gallbladder Surgery - Laparoscopic   Hysterectomy (not due to cancer) - Partial   Oral Surgery    Diagnostic Studies History  Colonoscopy   1-5 years ago Mammogram   within last year Pap Smear   1-5 years ago  Allergies  Levaquin *FLUOROQUINOLONES*   Cipro *FLUOROQUINOLONES*   Biaxin *MACROLIDES*   Rocephin *CEPHALOSPORINS*    Medication History  LORazepam  ('1MG'$  Tablet, Oral) Active. traMADol HCl  ('50MG'$  Tablet, Oral) Active. amLODIPine Besylate  ('5MG'$  Tablet, Oral) Active. Atorvastatin Calcium  ('80MG'$  Tablet, Oral) Active. DULoxetine HCl  ('20MG'$  Capsule DR Part, Oral) Active. Furosemide  ('20MG'$  Tablet, Oral) Active. Losartan Potassium  ('100MG'$  Tablet, Oral) Active. Vitamin B Complex  (Oral) Active. Vitamin D3  (50 MCG(2000 UT) Capsule, Oral) Active. ZyrTEC Allergy  ('10MG'$  Capsule, Oral) Active. Flonase  (50MCG/DOSE Inhaler, Nasal) Active. Coricidin D  (2-12.5-'325MG'$  Tablet, Oral) Active. Mucinex  ('600MG'$  Tablet ER 12HR, Oral)  Active. Tylenol  ('80MG'$ /0.8ML Suspension, Oral) Active. Aspirin  ('81MG'$  Tablet Chewable, Oral) Active. Omeprazole  ('10MG'$  Capsule DR, Oral) Active. Dicyclomine HCl  ('10MG'$ /ML Solution, Intramuscular) Active.  Social History  Alcohol use   Occasional alcohol use. Caffeine use   Carbonated beverages, Coffee. No drug use   Tobacco use   Never smoker.  Family History  Alcohol Abuse   Brother, Father. Arthritis   Sister. Breast Cancer   Mother. Cancer   Brother. Cerebrovascular Accident   Mother, Sister. Depression   Sister. Diabetes Mellitus   Mother, Sister. Heart Disease   Brother, Mother, Sister. Heart disease in female family member before age 35   Heart disease in female family member before age 2   Hypertension   Brother, Father, Mother, Sister. Respiratory Condition   Brother, Mother, Sister.  Pregnancy / Birth History  Age at menarche   31 years. Age of menopause   42-60 Contraceptive History   Oral contraceptives. Gravida   3 Maternal age   20-20 Para   2  Other Problems  Anxiety Disorder   Arthritis   Diverticulosis   Gastroesophageal Reflux Disease   High blood pressure   Hypercholesterolemia   Sleep Apnea      Review of Systems  General Not Present- Appetite Loss, Chills, Fatigue, Fever, Night Sweats, Weight Gain and Weight Loss. Skin Not Present- Change in Wart/Mole, Dryness, Hives, Jaundice, New Lesions, Non-Healing Wounds, Rash and Ulcer. HEENT Present- Ringing in the Ears and Seasonal Allergies. Not Present- Earache, Hearing Loss, Hoarseness, Nose  Bleed, Oral Ulcers, Sinus Pain, Sore Throat, Visual Disturbances, Wears glasses/contact lenses and Yellow Eyes. Respiratory Present- Snoring. Not Present- Bloody sputum, Chronic Cough, Difficulty Breathing and Wheezing. Breast Present- Breast Mass. Not Present- Breast Pain, Nipple Discharge and Skin Changes. Gastrointestinal Present- Indigestion. Not Present- Abdominal Pain, Bloating, Bloody Stool, Change in Bowel  Habits, Chronic diarrhea, Constipation, Difficulty Swallowing, Excessive gas, Gets full quickly at meals, Hemorrhoids, Nausea, Rectal Pain and Vomiting. Female Genitourinary Not Present- Frequency, Nocturia, Painful Urination, Pelvic Pain and Urgency. Musculoskeletal Present- Joint Pain and Joint Stiffness. Not Present- Back Pain, Muscle Pain, Muscle Weakness and Swelling of Extremities. Neurological Not Present- Decreased Memory, Fainting, Headaches, Numbness, Seizures, Tingling, Tremor, Trouble walking and Weakness. Psychiatric Present- Anxiety. Not Present- Bipolar, Change in Sleep Pattern, Depression, Fearful and Frequent crying. Endocrine Not Present- Cold Intolerance, Excessive Hunger, Hair Changes, Heat Intolerance, Hot flashes and New Diabetes. Hematology Not Present- Blood Thinners, Easy Bruising, Excessive bleeding, Gland problems, HIV and Persistent Infections.  Vitals  Weight: 170 lb   Height: 61 in  Body Surface Area: 1.76 m   Body Mass Index: 32.12 kg/m   Temp.: 97.4 F    Pulse: 96 (Regular)    P.OX: 96% (Room air) BP: 142/90(Sitting, Left Arm, Standard)       Physical Exam  General Mental Status - Alert. General Appearance - Consistent with stated age. Hydration - Well hydrated. Voice - Normal.  Head and Neck Head - normocephalic, atraumatic with no lesions or palpable masses. Trachea - midline. Thyroid Gland Characteristics - normal size and consistency.  Eye Eyeball - Bilateral - Extraocular movements intact. Sclera/Conjunctiva - Bilateral - No scleral icterus.  Chest and Lung Exam Chest and lung exam reveals  - quiet, even and easy respiratory effort with no use of accessory muscles and on auscultation, normal breath sounds, no adventitious sounds and normal vocal resonance. Inspection Chest Wall - Normal. Back - normal.  Breast Note:  There is no palpable mass in either breast. There is no palpable axillary, supraclavicular, or cervical  lymphadenopathy.   Cardiovascular Cardiovascular examination reveals  - normal heart sounds, regular rate and rhythm with no murmurs and normal pedal pulses bilaterally.  Abdomen Inspection Inspection of the abdomen reveals - No Hernias. Skin - Scar - no surgical scars. Palpation/Percussion Palpation and Percussion of the abdomen reveal - Soft, Non Tender, No Rebound tenderness, No Rigidity (guarding) and No hepatosplenomegaly. Auscultation Auscultation of the abdomen reveals - Bowel sounds normal.  Neurologic Neurologic evaluation reveals  - alert and oriented x 3 with no impairment of recent or remote memory. Mental Status - Normal.  Musculoskeletal Normal Exam - Left - Upper Extremity Strength Normal and Lower Extremity Strength Normal. Normal Exam - Right - Upper Extremity Strength Normal and Lower Extremity Strength Normal.  Lymphatic Head & Neck  General Head & Neck Lymphatics: Bilateral - Description - Normal. Axillary  General Axillary Region: Bilateral - Description - Normal. Tenderness - Non Tender. Femoral & Inguinal  Generalized Femoral & Inguinal Lymphatics: Bilateral - Description - Normal. Tenderness - Non Tender.    Assessment & Plan  COMPLEX SCLEROSING LESION OF RIGHT BREAST (N64.89) Impression: The patient appears to have a small area of complex sclerosing lesion in the upper portion of the right breast. Because of her family history of breast cancer she feels more comfortable having this area removed which I feel is a very reasonable choice. I have discussed with her in detail the risks and benefits of the operation as well as some  of the technical aspects including the use of a radioactive seed and she understands and wishes to proceed. Given her family history her lifetime risk of breast cancer is estimated at about 13%. The presence of the complex sclerosing lesion could potentially push this close to the 20% range. For this reason we will go ahead and refer  her to the high-risk clinic at the cancer center to talk about risk reduction. This patient encounter took 30 minutes today to perform the following: take history, perform exam, review outside records, interpret imaging, counsel the patient on their diagnosis and document encounter, findings & plan in the EHR Current Plans Referred to Oncology, for evaluation and follow up (Oncology). Routine.

## 2021-04-29 NOTE — Discharge Instructions (Signed)
  Post Anesthesia Home Care Instructions  Activity: Get plenty of rest for the remainder of the day. A responsible individual must stay with you for 24 hours following the procedure.  For the next 24 hours, DO NOT: -Drive a car -Paediatric nurse -Drink alcoholic beverages -Take any medication unless instructed by your physician -Make any legal decisions or sign important papers.  Meals: Start with liquid foods such as gelatin or soup. Progress to regular foods as tolerated. Avoid greasy, spicy, heavy foods. If nausea and/or vomiting occur, drink only clear liquids until the nausea and/or vomiting subsides. Call your physician if vomiting continues.  Special Instructions/Symptoms: Your throat may feel dry or sore from the anesthesia or the breathing tube placed in your throat during surgery. If this causes discomfort, gargle with warm salt water. The discomfort should disappear within 24 hours.  If you had a scopolamine patch placed behind your ear for the management of post- operative nausea and/or vomiting:  1. The medication in the patch is effective for 72 hours, after which it should be removed.  Wrap patch in a tissue and discard in the trash. Wash hands thoroughly with soap and water. 2. You may remove the patch earlier than 72 hours if you experience unpleasant side effects which may include dry mouth, dizziness or visual disturbances. 3. Avoid touching the patch. Wash your hands with soap and water after contact with the patch.  May take Tylenol at 4:20pm this afternoon 04/29/21.

## 2021-04-29 NOTE — Transfer of Care (Signed)
Immediate Anesthesia Transfer of Care Note  Patient: Jennifer Davenport  Procedure(s) Performed: RIGHT BREAST LUMPECTOMY WITH RADIOACTIVE SEED LOCALIZATION (Right: Breast)  Patient Location: PACU  Anesthesia Type:General  Level of Consciousness: sedated  Airway & Oxygen Therapy: Patient Spontanous Breathing and Patient connected to face mask oxygen  Post-op Assessment: Report given to RN and Post -op Vital signs reviewed and stable  Post vital signs: Reviewed and stable  Last Vitals:  Vitals Value Taken Time  BP    Temp    Pulse    Resp    SpO2      Last Pain:  Vitals:   04/29/21 1015  TempSrc: Oral  PainSc: 0-No pain         Complications: No notable events documented.

## 2021-04-29 NOTE — Anesthesia Preprocedure Evaluation (Addendum)
Anesthesia Evaluation  Patient identified by MRN, date of birth, ID band Patient awake    Reviewed: Allergy & Precautions, NPO status , Patient's Chart, lab work & pertinent test results  Airway Mallampati: II  TM Distance: >3 FB Neck ROM: Full    Dental no notable dental hx. (+) Dental Advisory Given, Teeth Intact   Pulmonary neg pulmonary ROS,    Pulmonary exam normal breath sounds clear to auscultation       Cardiovascular hypertension, Pt. on medications + CAD  Normal cardiovascular exam+ Valvular Problems/Murmurs MR  Rhythm:Regular Rate:Normal  Echo 11/2017 Left ventricle: The cavity size was normal. Wall thickness was increased in a pattern of mild LVH. Systolic function was normal. The estimated ejection fraction was in the range of 55% to 60%. -------------------------------------------------------------------  Aortic valve:  Mildly thickened, mildly calcified leaflets.  Doppler: There was no regurgitation.  -------------------------------------------------------------------  Mitral valve:  Structurally normal valve.  Leaflet separation was normal. Doppler: Transvalvular velocity was within the normal range. There was no evidence for stenosis. There was mild regurgitation.  Valve area by pressure half-time: 3.38 cm^2. Indexed valve area by pressure half-time: 2.01 cm^2/m^2.  Peak gradient (D): 3 mm Hg.  -------------------------------------------------------------------  Left atrium: The atrium was normal in size.  -------------------------------------------------------------------  Right ventricle: The cavity size was normal. Wall thickness was normal. Systolic function was normal.  -------------------------------------------------------------------  Pulmonic valve:  Poorly visualized. Doppler: There was no significant regurgitation.  -------------------------------------------------------------------   Tricuspid valve:  Structurally normal valve.  Leaflet separation was normal. Doppler: Transvalvular velocity was within the normal range. There was trivial regurgitation.  -------------------------------------------------------------------  Right atrium: The atrium was normal in size.  -------------------------------------------------------------------  Pericardium: There was no pericardial effusion.  -------------------------------------------------------------------  Inferior vena cava: The vessel was dilated. The respirophasic diameter changes were blunted (< 50%), consistent with elevated central venous pressure.    Neuro/Psych PSYCHIATRIC DISORDERS Anxiety negative neurological ROS     GI/Hepatic Neg liver ROS, GERD  ,  Endo/Other  negative endocrine ROS  Renal/GU negative Renal ROS     Musculoskeletal  (+) Arthritis ,   Abdominal (+) + obese,   Peds  Hematology negative hematology ROS (+)   Anesthesia Other Findings   Reproductive/Obstetrics                            Anesthesia Physical Anesthesia Plan  ASA: 2  Anesthesia Plan: General   Post-op Pain Management:    Induction: Intravenous  PONV Risk Score and Plan: 4 or greater and Ondansetron, Dexamethasone, Treatment may vary due to age or medical condition, Midazolam, Scopolamine patch - Pre-op and Diphenhydramine  Airway Management Planned: LMA  Additional Equipment: None  Intra-op Plan:   Post-operative Plan: Extubation in OR  Informed Consent: I have reviewed the patients History and Physical, chart, labs and discussed the procedure including the risks, benefits and alternatives for the proposed anesthesia with the patient or authorized representative who has indicated his/her understanding and acceptance.     Dental advisory given  Plan Discussed with: CRNA  Anesthesia Plan Comments:        Anesthesia Quick Evaluation

## 2021-05-02 ENCOUNTER — Encounter (HOSPITAL_BASED_OUTPATIENT_CLINIC_OR_DEPARTMENT_OTHER): Payer: Self-pay | Admitting: General Surgery

## 2021-05-02 LAB — SURGICAL PATHOLOGY

## 2021-05-03 DIAGNOSIS — H15102 Unspecified episcleritis, left eye: Secondary | ICD-10-CM | POA: Diagnosis not present

## 2021-05-03 NOTE — Anesthesia Postprocedure Evaluation (Signed)
Anesthesia Post Note  Patient: Jennifer Davenport  Procedure(s) Performed: RIGHT BREAST LUMPECTOMY WITH RADIOACTIVE SEED LOCALIZATION (Right: Breast)     Patient location during evaluation: PACU Anesthesia Type: General Level of consciousness: sedated and patient cooperative Pain management: pain level controlled Vital Signs Assessment: post-procedure vital signs reviewed and stable Respiratory status: spontaneous breathing Cardiovascular status: stable Anesthetic complications: no   No notable events documented.  Last Vitals:  Vitals:   04/29/21 1315 04/29/21 1328  BP: 135/74 (!) 151/76  Pulse: 90 92  Resp: 16 16  Temp:  36.7 C  SpO2: 90% 94%    Last Pain:  Vitals:   05/02/21 0855  TempSrc:   PainSc: 0-No pain                 Nolon Nations

## 2021-05-12 DIAGNOSIS — N6489 Other specified disorders of breast: Secondary | ICD-10-CM | POA: Insufficient documentation

## 2021-06-01 ENCOUNTER — Ambulatory Visit: Payer: Medicare Other

## 2021-06-02 ENCOUNTER — Other Ambulatory Visit: Payer: Self-pay | Admitting: Internal Medicine

## 2021-06-13 DIAGNOSIS — M1712 Unilateral primary osteoarthritis, left knee: Secondary | ICD-10-CM | POA: Diagnosis not present

## 2021-06-13 DIAGNOSIS — M170 Bilateral primary osteoarthritis of knee: Secondary | ICD-10-CM | POA: Diagnosis not present

## 2021-06-14 ENCOUNTER — Ambulatory Visit (INDEPENDENT_AMBULATORY_CARE_PROVIDER_SITE_OTHER): Payer: Medicare Other | Admitting: Internal Medicine

## 2021-06-14 ENCOUNTER — Other Ambulatory Visit: Payer: Self-pay

## 2021-06-14 ENCOUNTER — Encounter: Payer: Self-pay | Admitting: Internal Medicine

## 2021-06-14 VITALS — BP 128/82 | HR 75 | Temp 98.9°F | Ht 60.52 in | Wt 171.4 lb

## 2021-06-14 DIAGNOSIS — I10 Essential (primary) hypertension: Secondary | ICD-10-CM | POA: Diagnosis not present

## 2021-06-14 DIAGNOSIS — Z01818 Encounter for other preprocedural examination: Secondary | ICD-10-CM | POA: Diagnosis not present

## 2021-06-14 DIAGNOSIS — E785 Hyperlipidemia, unspecified: Secondary | ICD-10-CM | POA: Diagnosis not present

## 2021-06-14 DIAGNOSIS — N6310 Unspecified lump in the right breast, unspecified quadrant: Secondary | ICD-10-CM

## 2021-06-14 DIAGNOSIS — M25562 Pain in left knee: Secondary | ICD-10-CM | POA: Diagnosis not present

## 2021-06-14 DIAGNOSIS — F410 Panic disorder [episodic paroxysmal anxiety] without agoraphobia: Secondary | ICD-10-CM | POA: Diagnosis not present

## 2021-06-14 DIAGNOSIS — R7309 Other abnormal glucose: Secondary | ICD-10-CM | POA: Diagnosis not present

## 2021-06-14 DIAGNOSIS — G8929 Other chronic pain: Secondary | ICD-10-CM | POA: Diagnosis not present

## 2021-06-14 DIAGNOSIS — K9041 Non-celiac gluten sensitivity: Secondary | ICD-10-CM | POA: Diagnosis not present

## 2021-06-14 DIAGNOSIS — T148XXA Other injury of unspecified body region, initial encounter: Secondary | ICD-10-CM

## 2021-06-14 DIAGNOSIS — I25119 Atherosclerotic heart disease of native coronary artery with unspecified angina pectoris: Secondary | ICD-10-CM | POA: Diagnosis not present

## 2021-06-14 NOTE — Progress Notes (Signed)
Subjective:  Patient ID: Jennifer Davenport, female    DOB: [birth year]  Age: 70 y.o. MRN: [id number]  CC: Follow-up (3 month f/u)  L knee OA HPI Jennifer Davenport presents for:  IM Consult   Reason: Med clearance for left TKA   Req by Dr Theda Sers  Hx: f/u on HTN, IBS, depression  Outpatient Medications Prior to Visit  Medication Sig Dispense Refill   acetaminophen (TYLENOL) 500 MG tablet Take 500 mg by mouth every 6 (six) hours as needed.     amLODipine (NORVASC) 5 MG tablet Take 1 tablet (5 mg total) by mouth daily. 90 tablet 3   aspirin 81 MG EC tablet Take 1 by mouth daily     atorvastatin (LIPITOR) 80 MG tablet TAKE ONE TABLET BY MOUTH DAILY AT 6PM 90 tablet 3   Azelaic Acid 15 % cream Apply topically 1 day or 1 dose. After skin is thoroughly washed and patted dry, gently but thoroughly massage a thin film of azelaic acid cream into the affected area twice daily, in the morning and evening.     b complex vitamins tablet Take 1 tablet by mouth daily.     Cholecalciferol (VITAMIN D) 50 MCG (2000 UT) tablet      diclofenac Sodium (VOLTAREN) 1 % GEL Apply 1 application topically 4 (four) times daily. 100 g 3   dicyclomine (BENTYL) 10 MG capsule TAKE 1 CAPSULE EVERY 6-8 HOURS AS NEEDED FOR CRAMPING AND SPASMS 90 capsule 1   DULoxetine (CYMBALTA) 20 MG capsule TAKE ONE CAPSULE BY MOUTH DAILY 90 capsule 3   fluticasone (FLONASE) 50 MCG/ACT nasal spray Place 1-2 sprays into both nostrils daily.      furosemide (LASIX) 20 MG tablet TAKE 1/2 TO 1 TABLET BY MOUTH DAILY AS NEEDED FOR EDEMA 90 tablet 0   guaiFENesin (MUCINEX) 600 MG 12 hr tablet Take 2 tablets (1,200 mg total) by mouth 2 (two) times daily as needed. 60 tablet 0   hydroxychloroquine (PLAQUENIL) 200 MG tablet Take 200 mg by mouth daily. Take 1 tablet a day     Loratadine 10 MG CAPS Take 1 by mouth daily as needed     LORazepam (ATIVAN) 1 MG tablet TAKE 1/2 TO 1 TABLET BY MOUTH TWO TIMES A DAY AS NEEDED FOR ANXIETY 30  tablet 2   losartan (COZAAR) 100 MG tablet TAKE ONE TABLET BY MOUTH DAILY 90 tablet 3   omeprazole (PRILOSEC) 10 MG capsule      traMADol (ULTRAM) 50 MG tablet Take 1 tablet (50 mg total) by mouth every 6 (six) hours as needed. 20 tablet 0   triamcinolone cream (KENALOG) 0.1 %      No facility-administered medications prior to visit.   Past Medical History:  Diagnosis Date   Abnormal Pap smear of cervix    Allergy    Atrophic vaginitis    Bulging disc 10/2012   CERVICAL   Bursitis of hip    Cataract    mild starting of    Endometriosis    GERD (gastroesophageal reflux disease)    Hyperlipidemia    Hypertension    IBS (irritable bowel syndrome)    Past Surgical History:  Procedure Laterality Date   ABDOMINAL HYSTERECTOMY  11/1982   AUB & endometriosis, ovaries remain   APPENDECTOMY  1/72   BREAST LUMPECTOMY WITH RADIOACTIVE SEED LOCALIZATION Right 04/29/2021   Procedure: RIGHT BREAST LUMPECTOMY WITH RADIOACTIVE SEED LOCALIZATION;  Surgeon: Jovita Kussmaul, MD;  Location: [facility];  Service: General;  Laterality: Right;   BUNIONECTOMY Left 04/2004   left foot   CATARACT EXTRACTION     CERVIX LESION DESTRUCTION     COLONOSCOPY     CRYOTHERAPY     for abnormal pap smear   eyelid surgery Bilateral 2018   HYSTEROTOMY  5/72   fetal demise, also BTL   LAPAROSCOPIC CHOLECYSTECTOMY  02/2005   POLYPECTOMY     TMJ ARTHROPLASTY  6/94   secondary to hockey injury 15 years earlier   [place] Bilateral 5/72   UPPER GASTROINTESTINAL ENDOSCOPY      reports that she has never smoked. She has never used smokeless tobacco. She reports that she does not currently use alcohol. She reports that she does not use drugs. family history includes Breast cancer (age of onset: 67) in her mother; CVA in her mother; Colon polyps in her mother; Diverticulitis in her mother; Heart disease in her brother, maternal grandfather, maternal uncle, mother, and sister; Hypercholesterolemia in  her mother; Hypertension in her brother, father, maternal grandfather, maternal uncle, mother, and sister; Lung cancer (age of onset: 3) in her brother; Osteoarthritis in her mother and sister. Allergies  Allergen Reactions   Ceftriaxone Sodium     Anaphylaxis shock Can take Amoxicillin ok   Levaquin [Levofloxacin] Anaphylaxis   Rocephin [Ceftriaxone] Anaphylaxis   Biaxin [Clarithromycin]     Chest pain   Sulfonamide Derivatives Nausea Only   Aspirin     Cramps and diarrhea   Benadryl [Diphenhydramine]     Not tolerating well   Ciprofloxacin     myalgias   Codeine     nausea   Dairy Aid [Lactase]    Gluten Meal    Levaquin [Levofloxacin In D5w]     hives   Paxil [Paroxetine Hcl]     Headache   Pneumovax 23 [Pneumococcal Vac Polyvalent]     Rash    Rocephin [Ceftriaxone Sodium In Dextrose]    Simvastatin     Leg pains    ROS: Review of Systems  Constitutional:  Negative for activity change, appetite change, chills, fatigue and unexpected weight change.  HENT:  Negative for congestion, mouth sores and sinus pressure.   Eyes:  Negative for visual disturbance.  Respiratory:  Negative for cough and chest tightness.   Gastrointestinal:  Negative for abdominal pain and nausea.  Genitourinary:  Negative for difficulty urinating, frequency and vaginal pain.  Musculoskeletal:  Positive for arthralgias and gait problem. Negative for back pain.  Skin:  Negative for pallor and rash.  Neurological:  Negative for dizziness, tremors, weakness, numbness and headaches.  Psychiatric/Behavioral:  Negative for confusion and sleep disturbance.    Objective:  BP 128/82 (BP Location: Left Arm)   Pulse 75   Temp 98.9 F (37.2 C) (Oral)   Ht 5' 0.52" (1.537 m)   Wt 171 lb 6.4 oz (77.7 kg)   LMP 11/24/1982   SpO2 97%   BMI 32.90 kg/m   BP Readings from Last 3 Encounters:  06/14/21 128/82  04/29/21 (!) 151/76  03/14/21 (!) 141/88    Wt Readings from Last 3 Encounters:  06/14/21  171 lb 6.4 oz (77.7 kg)  04/29/21 169 lb 5 oz (76.8 kg)  03/14/21 171 lb 6.4 oz (77.7 kg)    Physical Exam Constitutional:      General: She is not in acute distress.    Appearance: She is well-developed. She is obese.  HENT:     Head: Normocephalic.  Right Ear: External ear normal.     Left Ear: External ear normal.     Nose: Nose normal.  Eyes:     General:        Right eye: No discharge.        Left eye: No discharge.     Conjunctiva/sclera: Conjunctivae normal.     Pupils: Pupils are equal, round, and reactive to light.  Neck:     Thyroid: No thyromegaly.     Vascular: No JVD.     Trachea: No tracheal deviation.  Cardiovascular:     Rate and Rhythm: Normal rate and regular rhythm.     Heart sounds: Normal heart sounds.  Pulmonary:     Effort: No respiratory distress.     Breath sounds: No stridor. No wheezing.  Abdominal:     General: Bowel sounds are normal. There is no distension.     Palpations: Abdomen is soft. There is no mass.     Tenderness: There is no abdominal tenderness. There is no guarding or rebound.  Musculoskeletal:        General: Tenderness present.     Cervical back: Normal range of motion and neck supple. No rigidity.  Lymphadenopathy:     Cervical: No cervical adenopathy.  Skin:    Findings: No erythema or rash.  Neurological:     Mental Status: She is oriented to person, place, and time.     Cranial Nerves: No cranial nerve deficit.     Motor: No abnormal muscle tone.     Coordination: Coordination normal.     Deep Tendon Reflexes: Reflexes normal.  Psychiatric:        Behavior: Behavior normal.        Thought Content: Thought content normal.        Judgment: Judgment normal.  L knee w/pain - w/ROM, limping  Lab Results  Component Value Date   WBC 5.6 03/14/2021   HGB 13.0 03/14/2021   HCT 38.4 03/14/2021   PLT 241.0 03/14/2021   GLUCOSE 89 04/22/2021   CHOL 153 03/14/2021   TRIG 80.0 03/14/2021   HDL 82.30 03/14/2021    LDLCALC 55 03/14/2021   ALT 21 03/14/2021   AST 20 03/14/2021   NA 137 04/22/2021   K 4.8 04/22/2021   CL 104 04/22/2021   CREATININE 0.58 04/22/2021   BUN 8 04/22/2021   CO2 27 04/22/2021   TSH 1.63 03/14/2021   HGBA1C 6.0 02/20/2017    MM Breast Surgical Specimen  Result Date: 04/29/2021 CLINICAL DATA:  Status post surgical excision today after earlier radioactive seed localization. EXAM: SPECIMEN RADIOGRAPH OF THE RIGHT BREAST COMPARISON:  Previous exam(s). FINDINGS: Status post excision of the right breast. The radioactive seed and biopsy marker clip are present, completely intact, and were marked for pathology. Findings discussed with the OR staff during the procedure. IMPRESSION: Specimen radiograph of the right breast. Electronically Signed   By: Franki Cabot M.D.   On: 04/29/2021 12:17   Assessment & Plan:   Problem List Items Addressed This Visit     Anxiety attack    Lorazepam prn  Potential benefits of a long term benzodiazepines  use as well as potential risks  and complications were explained to the patient and were aknowledged. On a low dose Cymbalta      Breast mass, right     S/p bx - benign  . BREAST, RIGHT, LUMPECTOMY:  - Focal residual complex sclerosing lesion.  - Fibrocystic changes.  -  Biopsy site and biopsy clip.  - No evidence of malignancy.   Mammo q 12 mo      Relevant Orders   APTT   CAD (coronary artery disease)    2019 CT angio: CT Coronary artery calcium score 46 Agatston units No angina, recent EKG was done before her breast surgery - ok Dr Claiborne Billings Cont on Lipitor      Relevant Orders   CBC with Differential/Platelet   Urinalysis   TSH   Comprehensive metabolic panel   Hemoglobin A1c   APTT   Protime-INR   Dyslipidemia    Cont on Lipitor      Relevant Orders   TSH   Protime-INR   Essential hypertension    Cont on Amlodipine, Losartan      Relevant Orders   APTT   Gluten intolerance    Continue gluten-free, milk free  diet      Knee pain, left    L knee OA w/pain IM Consult - Med clearance for left TKA  OK w/a low dose Tramadol - HA w>25 mg dose      Relevant Orders   APTT   Preop exam for internal medicine - Primary    Pat should be medically clear for left knee total knee arthroplasty by Dr. Theda Sers under spinal anesthesia assuming that her lab work comes back acceptable.  She can stop and restart her aspirin per your perioperative protocol.  Her EKG was recent-done prior to her breast surgery.  Her chest x-ray was normal-done last December.  She will try to lose more weight -intermittent fasting.  Labs ordered including A1c, INR, PTT. Thank you,      Relevant Orders   CBC with Differential/Platelet   Urinalysis   TSH   Comprehensive metabolic panel   Hemoglobin A1c   APTT   Protime-INR   Other Visit Diagnoses     Elevated glucose       Relevant Orders   Hemoglobin A1c   Bruising       Relevant Orders   APTT        Walker Kehr, MD

## 2021-06-14 NOTE — Assessment & Plan Note (Addendum)
L knee OA w/pain IM Consult - Med clearance for left TKA  OK w/a low dose Tramadol - HA w>25 mg dose

## 2021-06-14 NOTE — Assessment & Plan Note (Signed)
Jennifer Davenport should be medically clear for left knee total knee arthroplasty by Dr. Theda Sers under spinal anesthesia assuming that her lab work comes back acceptable.  She can stop and restart her aspirin per your perioperative protocol.  Her EKG was recent-done prior to her breast surgery.  Her chest x-ray was normal-done last December.  She will try to lose more weight -intermittent fasting.  Labs ordered including A1c, INR, PTT. Thank you,

## 2021-06-14 NOTE — Assessment & Plan Note (Signed)
S/p bx - benign  . BREAST, RIGHT, LUMPECTOMY:  - Focal residual complex sclerosing lesion.  - Fibrocystic changes.  - Biopsy site and biopsy clip.  - No evidence of malignancy.   Mammo q 12 mo

## 2021-06-14 NOTE — Patient Instructions (Signed)
The Obesity Code book by Sharman Cheek   These suggestions will probably help you to improve your metabolism if you are not overweight and to lose weight if you are overweight: 1.  Reduce your consumption of sugars and starches.  Eliminate high fructose corn syrup from your diet.  Reduce your consumption of processed foods.  For desserts try to have seasonal fruits, berries, nuts, cheeses or dark chocolate with more than 70% cacao. 2.  Do not snack 3.  You do not have to eat breakfast.  If you choose to have breakfast - eat plain greek yogurt, eggs, oatmeal (without sugar) - use honey if you need to. 4.  Drink water, freshly brewed unsweetened tea (green, black or herbal) or coffee.  Do not drink sodas including diet sodas , juices, beverages sweetened with artificial sweeteners. 5.  Reduce your consumption of refined grains. 6.  Avoid protein drinks such as Optifast, Slim fast etc. Eat chicken, fish, meat, dairy and beans for your sources of protein. 7.  Natural unprocessed fats like cold pressed virgin olive oil, butter, coconut oil are good for you.  Eat avocados. 8.  Increase your consumption of fiber.  Fruits, berries, vegetables, whole grains, flaxseed, chia seeds, beans, popcorn, nuts, oatmeal are good sources of fiber 9.  Use vinegar in your diet, i.e. apple cider vinegar, red wine or balsamic vinegar 10.  You can try fasting.  For example you can skip breakfast and lunch every other day (24-hour fast) 11.  Stress reduction, good night sleep, relaxation, meditation, yoga and other physical activity is likely to help you to maintain low weight too. 12.  If you drink alcohol, limit your alcohol intake to no more than 2 drinks a day.    Cabbage soup recipe that will not make you gain weight: Take 1 small head of cabbage, 1 average pack of celery, 4 green peppers, 4 onions, 2 cans diced tomatoes (they are not available without salt), salt and spices to taste.  Chop cabbage, celery, peppers and  onions.  And tomatoes and 2-2.5 liters (2.5 quarts) of water so that it would just cover the vegetables.  Bring to boil.  Add spices and salt.  Turn heat to low/medium and simmer for 20-25 minutes.  Naturally, you can make a smaller batch and change some of the ingredients.

## 2021-06-14 NOTE — Assessment & Plan Note (Signed)
Lorazepam prn  Potential benefits of a long term benzodiazepines  use as well as potential risks  and complications were explained to the patient and were aknowledged. On a low dose Cymbalta

## 2021-06-14 NOTE — Assessment & Plan Note (Signed)
Cont on Lipitor 

## 2021-06-14 NOTE — Assessment & Plan Note (Addendum)
2019 CT angio: CT Coronary artery calcium score 46 Agatston units No angina, recent EKG was done before her breast surgery - ok Dr Claiborne Billings Cont on Lipitor

## 2021-06-14 NOTE — Assessment & Plan Note (Signed)
Cont on Amlodipine, Losartan  

## 2021-06-14 NOTE — Assessment & Plan Note (Signed)
Continue gluten-free, milk free diet

## 2021-06-15 ENCOUNTER — Ambulatory Visit: Payer: Medicare Other | Attending: Internal Medicine

## 2021-06-15 ENCOUNTER — Other Ambulatory Visit (HOSPITAL_BASED_OUTPATIENT_CLINIC_OR_DEPARTMENT_OTHER): Payer: Self-pay

## 2021-06-15 DIAGNOSIS — Z23 Encounter for immunization: Secondary | ICD-10-CM

## 2021-06-15 MED ORDER — PFIZER COVID-19 VAC BIVALENT 30 MCG/0.3ML IM SUSP
INTRAMUSCULAR | 0 refills | Status: DC
  Filled 2021-06-15: qty 0.3, 1d supply, fill #0

## 2021-06-15 NOTE — Progress Notes (Signed)
   Covid-19 Vaccination Clinic  Name:  Jennifer Davenport    MRN: [id number] DOB: 06-01-1951  06/15/2021  [name] was observed post Covid-19 immunization for 15 minutes without incident. She was provided with Vaccine Information Sheet and instruction to access the V-Safe system.   [name] was instructed to call 911 with any severe reactions post vaccine: Difficulty breathing  Swelling of face and throat  A fast heartbeat  A bad rash all over body  Dizziness and weakness

## 2021-06-22 DIAGNOSIS — M25562 Pain in left knee: Secondary | ICD-10-CM | POA: Diagnosis not present

## 2021-06-24 ENCOUNTER — Other Ambulatory Visit (HOSPITAL_BASED_OUTPATIENT_CLINIC_OR_DEPARTMENT_OTHER): Payer: Self-pay

## 2021-06-24 MED ORDER — FLUAD QUADRIVALENT 0.5 ML IM PRSY
PREFILLED_SYRINGE | INTRAMUSCULAR | 0 refills | Status: DC
  Filled 2021-06-24: qty 0.5, 1d supply, fill #0

## 2021-06-27 ENCOUNTER — Other Ambulatory Visit: Payer: Self-pay | Admitting: Internal Medicine

## 2021-06-27 ENCOUNTER — Other Ambulatory Visit (INDEPENDENT_AMBULATORY_CARE_PROVIDER_SITE_OTHER): Payer: Medicare Other

## 2021-06-27 ENCOUNTER — Other Ambulatory Visit: Payer: Self-pay

## 2021-06-27 DIAGNOSIS — I25119 Atherosclerotic heart disease of native coronary artery with unspecified angina pectoris: Secondary | ICD-10-CM | POA: Diagnosis not present

## 2021-06-27 DIAGNOSIS — M25562 Pain in left knee: Secondary | ICD-10-CM

## 2021-06-27 DIAGNOSIS — T148XXA Other injury of unspecified body region, initial encounter: Secondary | ICD-10-CM

## 2021-06-27 DIAGNOSIS — E785 Hyperlipidemia, unspecified: Secondary | ICD-10-CM

## 2021-06-27 DIAGNOSIS — N6310 Unspecified lump in the right breast, unspecified quadrant: Secondary | ICD-10-CM

## 2021-06-27 DIAGNOSIS — D649 Anemia, unspecified: Secondary | ICD-10-CM

## 2021-06-27 DIAGNOSIS — G8929 Other chronic pain: Secondary | ICD-10-CM

## 2021-06-27 DIAGNOSIS — Z01818 Encounter for other preprocedural examination: Secondary | ICD-10-CM

## 2021-06-27 DIAGNOSIS — R7309 Other abnormal glucose: Secondary | ICD-10-CM

## 2021-06-27 DIAGNOSIS — I10 Essential (primary) hypertension: Secondary | ICD-10-CM

## 2021-06-27 LAB — CBC WITH DIFFERENTIAL/PLATELET
Basophils Absolute: 0 10*3/uL (ref 0.0–0.1)
Basophils Relative: 0.7 % (ref 0.0–3.0)
Eosinophils Absolute: 0.2 10*3/uL (ref 0.0–0.7)
Eosinophils Relative: 3.3 % (ref 0.0–5.0)
HCT: 36.1 % (ref 36.0–46.0)
Hemoglobin: 11.8 g/dL — ABNORMAL LOW (ref 12.0–15.0)
Lymphocytes Relative: 33.1 % (ref 12.0–46.0)
Lymphs Abs: 1.8 10*3/uL (ref 0.7–4.0)
MCHC: 32.7 g/dL (ref 30.0–36.0)
MCV: 86.6 fl (ref 78.0–100.0)
Monocytes Absolute: 0.5 10*3/uL (ref 0.1–1.0)
Monocytes Relative: 9.5 % (ref 3.0–12.0)
Neutro Abs: 2.8 10*3/uL (ref 1.4–7.7)
Neutrophils Relative %: 53.4 % (ref 43.0–77.0)
Platelets: 237 10*3/uL (ref 150.0–400.0)
RBC: 4.17 Mil/uL (ref 3.87–5.11)
RDW: 13.5 % (ref 11.5–15.5)
WBC: 5.3 10*3/uL (ref 4.0–10.5)

## 2021-06-27 LAB — URINALYSIS
Bilirubin Urine: NEGATIVE
Hgb urine dipstick: NEGATIVE
Ketones, ur: NEGATIVE
Leukocytes,Ua: NEGATIVE
Nitrite: NEGATIVE
Specific Gravity, Urine: 1.01 (ref 1.000–1.030)
Total Protein, Urine: NEGATIVE
Urine Glucose: NEGATIVE
Urobilinogen, UA: 0.2 (ref 0.0–1.0)
pH: 7 (ref 5.0–8.0)

## 2021-06-27 LAB — COMPREHENSIVE METABOLIC PANEL
ALT: 19 U/L (ref 0–35)
AST: 17 U/L (ref 0–37)
Albumin: 4.1 g/dL (ref 3.5–5.2)
Alkaline Phosphatase: 54 U/L (ref 39–117)
BUN: 12 mg/dL (ref 6–23)
CO2: 26 mEq/L (ref 19–32)
Calcium: 9.3 mg/dL (ref 8.4–10.5)
Chloride: 104 mEq/L (ref 96–112)
Creatinine, Ser: 0.61 mg/dL (ref 0.40–1.20)
GFR: 91.46 mL/min (ref >60.00)
Glucose, Bld: 87 mg/dL (ref 70–99)
Potassium: 4.2 mEq/L (ref 3.5–5.1)
Sodium: 138 mEq/L (ref 135–145)
Total Bilirubin: 0.3 mg/dL (ref 0.2–1.2)
Total Protein: 6.6 g/dL (ref 6.0–8.3)

## 2021-06-27 LAB — PROTIME-INR
INR: 1 ratio (ref 0.8–1.0)
Prothrombin Time: 10.8 s (ref 9.6–13.1)

## 2021-06-27 LAB — APTT: aPTT: 30.6 s (ref 23.4–32.7)

## 2021-06-27 LAB — TSH: TSH: 2.25 u[IU]/mL (ref 0.35–5.50)

## 2021-06-27 LAB — HEMOGLOBIN A1C: Hgb A1c MFr Bld: 5.9 % (ref 4.6–6.5)

## 2021-06-28 DIAGNOSIS — Z20822 Contact with and (suspected) exposure to covid-19: Secondary | ICD-10-CM | POA: Diagnosis not present

## 2021-06-29 ENCOUNTER — Ambulatory Visit (INDEPENDENT_AMBULATORY_CARE_PROVIDER_SITE_OTHER): Payer: Medicare Other

## 2021-06-29 ENCOUNTER — Other Ambulatory Visit: Payer: Self-pay

## 2021-06-29 DIAGNOSIS — Z23 Encounter for immunization: Secondary | ICD-10-CM | POA: Diagnosis not present

## 2021-07-01 DIAGNOSIS — M1712 Unilateral primary osteoarthritis, left knee: Secondary | ICD-10-CM | POA: Diagnosis not present

## 2021-07-01 DIAGNOSIS — Z01818 Encounter for other preprocedural examination: Secondary | ICD-10-CM | POA: Diagnosis not present

## 2021-07-12 ENCOUNTER — Other Ambulatory Visit (INDEPENDENT_AMBULATORY_CARE_PROVIDER_SITE_OTHER): Payer: Medicare Other

## 2021-07-12 ENCOUNTER — Other Ambulatory Visit: Payer: Self-pay

## 2021-07-12 DIAGNOSIS — D649 Anemia, unspecified: Secondary | ICD-10-CM | POA: Diagnosis not present

## 2021-07-12 LAB — CBC WITH DIFFERENTIAL/PLATELET
Basophils Absolute: 0 10*3/uL (ref 0.0–0.1)
Basophils Relative: 0.7 % (ref 0.0–3.0)
Eosinophils Absolute: 0.1 10*3/uL (ref 0.0–0.7)
Eosinophils Relative: 2.7 % (ref 0.0–5.0)
HCT: 35.6 % — ABNORMAL LOW (ref 36.0–46.0)
Hemoglobin: 11.6 g/dL — ABNORMAL LOW (ref 12.0–15.0)
Lymphocytes Relative: 34.1 % (ref 12.0–46.0)
Lymphs Abs: 1.8 10*3/uL (ref 0.7–4.0)
MCHC: 32.6 g/dL (ref 30.0–36.0)
MCV: 87.1 fl (ref 78.0–100.0)
Monocytes Absolute: 0.4 10*3/uL (ref 0.1–1.0)
Monocytes Relative: 7.8 % (ref 3.0–12.0)
Neutro Abs: 2.9 10*3/uL (ref 1.4–7.7)
Neutrophils Relative %: 54.7 % (ref 43.0–77.0)
Platelets: 228 10*3/uL (ref 150.0–400.0)
RBC: 4.09 Mil/uL (ref 3.87–5.11)
RDW: 13.6 % (ref 11.5–15.5)
WBC: 5.3 10*3/uL (ref 4.0–10.5)

## 2021-07-13 LAB — IRON,TIBC AND FERRITIN PANEL
%SAT: 22 % (calc) (ref 16–45)
Ferritin: 30 ng/mL (ref 16–288)
Iron: 75 ug/dL (ref 45–160)
TIBC: 338 mcg/dL (calc) (ref 250–450)

## 2021-07-14 DIAGNOSIS — G8918 Other acute postprocedural pain: Secondary | ICD-10-CM | POA: Diagnosis not present

## 2021-07-14 DIAGNOSIS — M1712 Unilateral primary osteoarthritis, left knee: Secondary | ICD-10-CM | POA: Diagnosis not present

## 2021-07-18 DIAGNOSIS — M25562 Pain in left knee: Secondary | ICD-10-CM | POA: Diagnosis not present

## 2021-07-20 ENCOUNTER — Ambulatory Visit (HOSPITAL_COMMUNITY)
Admission: RE | Admit: 2021-07-20 | Discharge: 2021-07-20 | Disposition: A | Discharge status: Home / Self Care | Payer: Medicare Other | Source: Ambulatory Visit | Attending: Cardiology | Admitting: Cardiology

## 2021-07-20 ENCOUNTER — Other Ambulatory Visit (HOSPITAL_COMMUNITY): Payer: Self-pay | Admitting: Specialist

## 2021-07-20 ENCOUNTER — Other Ambulatory Visit: Payer: Self-pay

## 2021-07-20 DIAGNOSIS — M79662 Pain in left lower leg: Secondary | ICD-10-CM

## 2021-07-20 DIAGNOSIS — M7989 Other specified soft tissue disorders: Secondary | ICD-10-CM | POA: Diagnosis not present

## 2021-07-22 DIAGNOSIS — M25562 Pain in left knee: Secondary | ICD-10-CM | POA: Diagnosis not present

## 2021-07-23 ENCOUNTER — Other Ambulatory Visit: Payer: Self-pay | Admitting: Internal Medicine

## 2021-07-25 DIAGNOSIS — M25562 Pain in left knee: Secondary | ICD-10-CM | POA: Diagnosis not present

## 2021-07-27 DIAGNOSIS — M25562 Pain in left knee: Secondary | ICD-10-CM | POA: Diagnosis not present

## 2021-07-29 DIAGNOSIS — Z4789 Encounter for other orthopedic aftercare: Secondary | ICD-10-CM | POA: Diagnosis not present

## 2021-07-29 DIAGNOSIS — M25562 Pain in left knee: Secondary | ICD-10-CM | POA: Diagnosis not present

## 2021-08-04 DIAGNOSIS — M25562 Pain in left knee: Secondary | ICD-10-CM | POA: Diagnosis not present

## 2021-08-08 DIAGNOSIS — M25562 Pain in left knee: Secondary | ICD-10-CM | POA: Diagnosis not present

## 2021-08-11 ENCOUNTER — Ambulatory Visit (INDEPENDENT_AMBULATORY_CARE_PROVIDER_SITE_OTHER)
Admission: RE | Admit: 2021-08-11 | Discharge: 2021-08-11 | Disposition: A | Discharge status: Home / Self Care | Payer: Medicare Other | Source: Ambulatory Visit | Attending: Internal Medicine | Admitting: Internal Medicine

## 2021-08-11 ENCOUNTER — Other Ambulatory Visit: Payer: Self-pay

## 2021-08-11 ENCOUNTER — Telehealth: Payer: Self-pay | Admitting: Internal Medicine

## 2021-08-11 ENCOUNTER — Encounter: Payer: Self-pay | Admitting: Internal Medicine

## 2021-08-11 ENCOUNTER — Ambulatory Visit (INDEPENDENT_AMBULATORY_CARE_PROVIDER_SITE_OTHER): Payer: Medicare Other | Admitting: Internal Medicine

## 2021-08-11 VITALS — BP 140/90 | HR 78 | Temp 98.3°F | Resp 16 | Ht 60.0 in | Wt 171.0 lb

## 2021-08-11 DIAGNOSIS — T8149XA Infection following a procedure, other surgical site, initial encounter: Secondary | ICD-10-CM | POA: Diagnosis not present

## 2021-08-11 DIAGNOSIS — L089 Local infection of the skin and subcutaneous tissue, unspecified: Secondary | ICD-10-CM | POA: Diagnosis not present

## 2021-08-11 DIAGNOSIS — D539 Nutritional anemia, unspecified: Secondary | ICD-10-CM | POA: Diagnosis not present

## 2021-08-11 DIAGNOSIS — M25562 Pain in left knee: Secondary | ICD-10-CM | POA: Diagnosis not present

## 2021-08-11 DIAGNOSIS — R6889 Other general symptoms and signs: Secondary | ICD-10-CM

## 2021-08-11 DIAGNOSIS — I25119 Atherosclerotic heart disease of native coronary artery with unspecified angina pectoris: Secondary | ICD-10-CM | POA: Diagnosis not present

## 2021-08-11 DIAGNOSIS — M25462 Effusion, left knee: Secondary | ICD-10-CM

## 2021-08-11 LAB — IBC + FERRITIN
Ferritin: 86.4 ng/mL (ref 10.0–291.0)
Iron: 57 ug/dL (ref 42–145)
Saturation Ratios: 13.9 % — ABNORMAL LOW (ref 20.0–50.0)
TIBC: 408.8 ug/dL (ref 250.0–450.0)
Transferrin: 292 mg/dL (ref 212.0–360.0)

## 2021-08-11 LAB — URINALYSIS, ROUTINE W REFLEX MICROSCOPIC
Bilirubin Urine: NEGATIVE
Hgb urine dipstick: NEGATIVE
Ketones, ur: NEGATIVE
Leukocytes,Ua: NEGATIVE
Nitrite: NEGATIVE
Specific Gravity, Urine: <=1.005 — AB (ref 1.000–1.030)
Total Protein, Urine: NEGATIVE
Urine Glucose: NEGATIVE
Urobilinogen, UA: 0.2 (ref 0.0–1.0)
pH: 6.5 (ref 5.0–8.0)

## 2021-08-11 LAB — CBC WITH DIFFERENTIAL/PLATELET
Basophils Absolute: 0 10*3/uL (ref 0.0–0.1)
Basophils Relative: 0.5 % (ref 0.0–3.0)
Eosinophils Absolute: 0.1 10*3/uL (ref 0.0–0.7)
Eosinophils Relative: 1.7 % (ref 0.0–5.0)
HCT: 35 % — ABNORMAL LOW (ref 36.0–46.0)
Hemoglobin: 11.3 g/dL — ABNORMAL LOW (ref 12.0–15.0)
Lymphocytes Relative: 22.3 % (ref 12.0–46.0)
Lymphs Abs: 1.8 10*3/uL (ref 0.7–4.0)
MCHC: 32.4 g/dL (ref 30.0–36.0)
MCV: 87.7 fl (ref 78.0–100.0)
Monocytes Absolute: 0.7 10*3/uL (ref 0.1–1.0)
Monocytes Relative: 8.8 % (ref 3.0–12.0)
Neutro Abs: 5.4 10*3/uL (ref 1.4–7.7)
Neutrophils Relative %: 66.7 % (ref 43.0–77.0)
Platelets: 346 10*3/uL (ref 150.0–400.0)
RBC: 4 Mil/uL (ref 3.87–5.11)
RDW: 14.7 % (ref 11.5–15.5)
WBC: 8.2 10*3/uL (ref 4.0–10.5)

## 2021-08-11 LAB — VITAMIN B12: Vitamin B-12: 488 pg/mL (ref 211–911)

## 2021-08-11 LAB — FOLATE: Folate: >23.4 ng/mL (ref >5.9)

## 2021-08-11 LAB — C-REACTIVE PROTEIN: CRP: <1 mg/dL (ref 0.5–20.0)

## 2021-08-11 MED ORDER — NUZYRA 150 MG PO TABS: 3.0000 | ORAL_TABLET | Freq: Every day | ORAL | 0 refills | Status: DC

## 2021-08-11 MED ORDER — NUZYRA 150 MG PO TABS: 2.0000 | ORAL_TABLET | Freq: Every day | ORAL | 0 refills | Status: AC

## 2021-08-11 MED ORDER — NUZYRA 150 MG PO TABS: 2.0000 | ORAL_TABLET | Freq: Every day | ORAL | 0 refills | Status: DC

## 2021-08-11 NOTE — Patient Instructions (Signed)
Wound Infection A wound infection happens when tiny organisms (microorganisms) start to grow in a wound. A wound infection is most often caused by bacteria. Infection can cause the wound to break open or worsen. Wound infection needs treatment. If a wound infection is left untreated, complications can occur. Untreated wound infections may lead to an infection in the bloodstream (septicemia) or a bone infection (osteomyelitis). What are the causes? This condition is most often caused by bacteria growing in a wound. Other microorganisms, like yeast and fungi, can also cause wound infections. What increases the risk? The following factors may make you more likely to develop this condition: Having a weak body defense system (immune system). Having diabetes. Taking steroid medicines for a long time (chronic use). Smoking. Being an older person. Being overweight. Taking chemotherapy medicines. What are the signs or symptoms? Symptoms of this condition include: Having more redness, swelling, or pain at the wound site. Having more blood or fluid at the wound site. A bad smell coming from a wound or bandage (dressing). Having a fever. Feeling tired or fatigued. Having warmth at or around the wound. Having pus at the wound site. How is this diagnosed? This condition is diagnosed with a medical history and physical exam. You may also have a wound culture or blood tests or both. How is this treated? This condition is usually treated with an antibiotic medicine. The infection should improve 24-48 hours after you start antibiotics. After 24-48 hours, redness around the wound should stop spreading, and the wound should be less painful. Follow these instructions at home: Medicines Take or apply over-the-counter and prescription medicines only as told by your health care provider. If you were prescribed an antibiotic medicine, take or apply it as told by your health care provider. Do not stop using the  antibiotic even if you start to feel better. Wound care  Clean the wound each day, or as told by your health care provider. Wash the wound with mild soap and water. Rinse the wound with water to remove all soap. Pat the wound dry with a clean towel. Do not rub it. Follow instructions from your health care provider about how to take care of your wound. Make sure you: Wash your hands with soap and water before and after you change your dressing. If soap and water are not available, use hand sanitizer. Change your dressing as told by your health care provider. Leave stitches (sutures), skin glue, or adhesive strips in place if your wound has been closed. These skin closures may need to stay in place for 2 weeks or longer. If adhesive strip edges start to loosen and curl up, you may trim the loose edges. Do not remove adhesive strips completely unless your health care provider tells you to do that. Some wounds are left open to heal on their own. Check your wound every day for signs of infection. Watch for: More redness, swelling, or pain. More fluid or blood. Warmth. Pus or a bad smell. General instructions Keep the dressing dry until your health care provider says it can be removed. Do not take baths, swim, or use a hot tub until your health care provider approves. Ask your health care provider if you may take showers. You may only be allowed to take sponge baths. Raise (elevate) the injured area above the level of your heart while you are sitting or lying down. Do not scratch or pick at the wound. Keep all follow-up visits as told by your health care  provider. This is important. Contact a health care provider if: Your pain is not controlled with medicine. You have more redness, swelling, or pain around your wound. You have more fluid or blood coming from your wound. Your wound feels warm to the touch. You have pus coming from your wound. You continue to notice a bad smell coming from your  wound or your dressing. Your wound that was closed breaks open. Get help right away if: You have a red streak going away from your wound. You have a fever. Summary A wound infection happens when tiny organisms (microorganisms) start to grow in a wound. This condition is usually treated with an antibiotic medicine. Follow instructions from your health care provider about how to take care of your wound. Contact a health care provider if your wound infection does not begin to improve in 24-48 hours, or your symptoms worsen. Keep all follow-up visits as told by your health care provider. This is important. This information is not intended to replace advice given to you by your health care provider. Make sure you discuss any questions you have with your health care provider. Document Revised: 04/23/2018 Document Reviewed: 04/23/2018 Elsevier Patient Education  [place].

## 2021-08-11 NOTE — Progress Notes (Signed)
Subjective:  Patient ID: Jennifer Davenport, female    DOB: 1951-01-14  Age: 70 y.o. MRN: [id number]  CC: Chills  This visit occurred during the SARS-CoV-2 public health emergency.  Safety protocols were in place, including screening questions prior to the visit, additional usage of staff PPE, and extensive cleaning of exam room while observing appropriate contact time as indicated for disinfecting solutions.    HPI Jennifer Davenport presents for a 3 week hx of rigors and chills. She has Left TRK 4 weeks ago. There is some pain/swelling over the incision.     Outpatient Medications Prior to Visit  Medication Sig Dispense Refill   acetaminophen (TYLENOL) 500 MG tablet Take 500 mg by mouth every 6 (six) hours as needed.     amLODipine (NORVASC) 5 MG tablet Take 1 tablet (5 mg total) by mouth daily. 90 tablet 3   aspirin 81 MG EC tablet Take 1 by mouth daily     atorvastatin (LIPITOR) 80 MG tablet TAKE ONE TABLET BY MOUTH DAILY AT 6PM 90 tablet 3   Azelaic Acid 15 % cream Apply topically 1 day or 1 dose. After skin is thoroughly washed and patted dry, gently but thoroughly massage a thin film of azelaic acid cream into the affected area twice daily, in the morning and evening.     b complex vitamins tablet Take 1 tablet by mouth daily.     Cholecalciferol (VITAMIN D) 50 MCG (2000 UT) tablet      diclofenac Sodium (VOLTAREN) 1 % GEL Apply 1 application topically 4 (four) times daily. 100 g 3   dicyclomine (BENTYL) 10 MG capsule TAKE 1 CAPSULE EVERY 6-8 HOURS AS NEEDED FOR CRAMPING AND SPASMS 90 capsule 1   DULoxetine (CYMBALTA) 20 MG capsule TAKE ONE CAPSULE BY MOUTH DAILY 90 capsule 3   fluticasone (FLONASE) 50 MCG/ACT nasal spray Place 1-2 sprays into both nostrils daily.      furosemide (LASIX) 20 MG tablet TAKE 1/2 TO 1 TABLET BY MOUTH DAILY AS NEEDED FOR EDEMA 90 tablet 0   guaiFENesin (MUCINEX) 600 MG 12 hr tablet Take 2 tablets (1,200 mg total) by mouth 2 (two) times daily as  needed. 60 tablet 0   hydroxychloroquine (PLAQUENIL) 200 MG tablet Take 200 mg by mouth daily. Take 1 tablet a day     influenza vaccine adjuvanted (FLUAD QUADRIVALENT) 0.5 ML injection Inject into the muscle. 0.5 mL 0   Loratadine 10 MG CAPS Take 1 by mouth daily as needed     LORazepam (ATIVAN) 1 MG tablet TAKE 1/2 TO 1 TABLET BY MOUTH TWO TIMES A DAY AS NEEDED FOR ANXIETY 30 tablet 2   losartan (COZAAR) 100 MG tablet TAKE ONE TABLET BY MOUTH DAILY 90 tablet 3   omeprazole (PRILOSEC) 10 MG capsule      traMADol (ULTRAM) 50 MG tablet Take 1 tablet (50 mg total) by mouth every 6 (six) hours as needed. 20 tablet 0   triamcinolone cream (KENALOG) 0.1 %      COVID-19 mRNA bivalent vaccine, Pfizer, (PFIZER COVID-19 VAC BIVALENT) injection Inject into the muscle. 0.3 mL 0   No facility-administered medications prior to visit.    ROS Review of Systems  Objective:  BP 140/90 (BP Location: Left Arm, Patient Position: Sitting, Cuff Size: Large)   Pulse 78   Temp 98.3 F (36.8 C) (Oral)   Resp 16   Ht 5' (1.524 m)   Wt 171 lb (77.6 kg)   LMP 11/24/1982  SpO2 97%   BMI 33.40 kg/m   BP Readings from Last 3 Encounters:  08/11/21 140/90  06/14/21 128/82  04/29/21 (!) 151/76    Wt Readings from Last 3 Encounters:  08/11/21 171 lb (77.6 kg)  06/14/21 171 lb 6.4 oz (77.7 kg)  04/29/21 169 lb 5 oz (76.8 kg)    Physical Exam Vitals reviewed.  Constitutional:      General: She is not in acute distress.    Appearance: She is not ill-appearing, toxic-appearing or diaphoretic.  HENT:     Nose: Nose normal.     Mouth/Throat:     Mouth: Mucous membranes are moist.  Eyes:     General: No scleral icterus. Cardiovascular:     Rate and Rhythm: Normal rate and regular rhythm.     Heart sounds: No murmur heard.   No gallop.  Pulmonary:     Effort: Pulmonary effort is normal.     Breath sounds: No stridor. No wheezing, rhonchi or rales.  Abdominal:     General: Abdomen is flat.      Palpations: There is no mass.     Tenderness: There is no abdominal tenderness. There is no guarding.     Hernia: No hernia is present.  Musculoskeletal:     Cervical back: Neck supple.     Right knee: Normal.     Left knee: Swelling, deformity and erythema present. No bony tenderness or crepitus. Decreased range of motion. Tenderness present.     Right lower leg: No swelling. No edema.     Left lower leg: No swelling. No edema.     Comments: L knee - The edges of the wound are well approximated with no dehiscence.  There is no exudate, induration, or fluctuance.  There is mild, diffuse surrounding erythema warmth, and tenderness.  See photo.  Lymphadenopathy:     Cervical: No cervical adenopathy.  Skin:    General: Skin is warm and dry.     Findings: Erythema present.  Neurological:     General: No focal deficit present.     Mental Status: She is alert.    Lab Results  Component Value Date   WBC 8.2 08/11/2021   HGB 11.3 (L) 08/11/2021   HCT 35.0 (L) 08/11/2021   PLT 346.0 08/11/2021   GLUCOSE 87 06/27/2021   CHOL 153 03/14/2021   TRIG 80.0 03/14/2021   HDL 82.30 03/14/2021   LDLCALC 55 03/14/2021   ALT 19 06/27/2021   AST 17 06/27/2021   NA 138 06/27/2021   K 4.2 06/27/2021   CL 104 06/27/2021   CREATININE 0.61 06/27/2021   BUN 12 06/27/2021   CO2 26 06/27/2021   TSH 2.25 06/27/2021   INR 1.0 06/27/2021   HGBA1C 5.9 06/27/2021    VAS [place] LOWER EXTREMITY VENOUS (DVT)  Result Date: 07/22/2021  Lower Venous DVT Study Patient Name:  Jennifer Davenport [facility]  Date of Exam:   07/20/2021 Medical Rec #: [id number]             Accession #:    [id number] Date of Birth: 01-Dec-1950             Patient Gender: F Patient Age:   83 years Exam Location:  [facility] Procedure:      VAS [place] LOWER EXTREMITY VENOUS (DVT) Referring Phys: Sydnee Cabal --------------------------------------------------------------------------------  Indications: Pain, Swelling, and 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 Other Indications: Patient denies any chest pain or shortness of breath. 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 Comparison Study: NA Performing Technologist: Leavy Cella RDCS Supporting Technologist: Wilkie Aye RVT  Examination Guidelines: A complete evaluation includes B-mode imaging, spectral Doppler, color Doppler, and power Doppler as needed of all accessible portions of each vessel. Bilateral testing is considered an integral part of a complete examination. Limited examinations for reoccurring indications may be performed as noted. The reflux portion of the exam is performed with the patient in reverse Trendelenburg.  +-----+---------------+---------+-----------+----------+--------------+ RIGHTCompressibilityPhasicitySpontaneityPropertiesThrombus Aging +-----+---------------+---------+-----------+----------+--------------+ CFV  Full           Yes      Yes                                 +-----+---------------+---------+-----------+----------+--------------+   +---------+---------------+---------+-----------+----------+--------------+ LEFT     CompressibilityPhasicitySpontaneityPropertiesThrombus Aging +---------+---------------+---------+-----------+----------+--------------+ CFV      Full           Yes      Yes                                 +---------+---------------+---------+-----------+----------+--------------+ SFJ      Full           Yes      Yes                                 +---------+---------------+---------+-----------+----------+--------------+ FV Prox  Full           Yes      Yes                                 +---------+---------------+---------+-----------+----------+--------------+ FV Mid   Full           Yes      Yes                                 +---------+---------------+---------+-----------+----------+--------------+ FV DistalFull           Yes      Yes                                  +---------+---------------+---------+-----------+----------+--------------+ PFV      Full                                                        +---------+---------------+---------+-----------+----------+--------------+ POP      Full           Yes      Yes                                 +---------+---------------+---------+-----------+----------+--------------+ PTV      Full           Yes      Yes                                 +---------+---------------+---------+-----------+----------+--------------+  PERO     Full           Yes      Yes                                 +---------+---------------+---------+-----------+----------+--------------+ Gastroc  Full                                                        +---------+---------------+---------+-----------+----------+--------------+ GSV      Full           Yes      Yes                                 +---------+---------------+---------+-----------+----------+--------------+     Summary: RIGHT: - No evidence of common femoral vein obstruction.  LEFT: - No evidence of deep vein thrombosis in the lower extremity. No indirect evidence of obstruction proximal to the inguinal ligament. - No cystic structure found in the popliteal fossa.  *See table(s) above for measurements and observations. Electronically signed by Kathlyn Sacramento MD on 07/22/2021 at 2:57:21 PM.    Final    DG Knee Complete 4 Views Left  Result Date: 08/11/2021 CLINICAL DATA:  Left knee pain/swelling EXAM: LEFT KNEE - COMPLETE 4+ VIEW COMPARISON:  None. FINDINGS: There is a 3 component total knee arthroplasty in normal alignment without evidence of loosening or periprosthetic fracture. There is cortical irregularity along the medial joint margin at the prosthetic-bone interface in the medial femoral condyle and medial tibial plateau. Small joint effusion. IMPRESSION: Normal 3 component total knee arthroplasty alignment without  evidence of loosening or periprosthetic fracture. Small joint effusion. Cortical irregularity along the medial joint margin at the prosthetic-bone interface in the medial femoral condyle and medial tibial plateau, favored to be postoperative change. Correlation with immediate postoperative imaging, if available, would be useful. Electronically Signed   By: Maurine Simmering M.D.   On: 08/11/2021 12:12     Assessment & Plan:   Siana was seen today for chills.  Diagnoses and all orders for this visit:  Rigors -     CBC with Differential/Platelet; Future -     C-reactive protein; Future -     Cancel: DG Knee Complete 4 Views Left; Future -     Urinalysis, Routine w reflex microscopic; Future -     Urinalysis, Routine w reflex microscopic -     C-reactive protein -     CBC with Differential/Platelet  Pain and swelling of knee, left -     CBC with Differential/Platelet; Future -     C-reactive protein; Future -     Cancel: DG Knee Complete 4 Views Left; Future -     C-reactive protein -     CBC with Differential/Platelet -     DG Knee Complete 4 Views Left; Future  Deficiency anemia- Will screen for vitamin deficiencies. -     CBC with Differential/Platelet; Future -     Vitamin B12; Future -     IBC + Ferritin; Future -     Folate; Future -     Vitamin B1; Future -     Folate -     Vitamin B1 -  Vitamin B12 -     IBC + Ferritin -     CBC with Differential/Platelet  Postoperative wound infection- She has multiple drug allergies.  Will treat for skin and soft tissue infection with omadacycline. -     NUZYRA 150 MG TABS; Take 3 tablets by mouth daily for 1 day. -     Discontinue: Omadacycline Tosylate (NUZYRA) 150 MG TABS; Take 2 tablets by mouth daily for 7 days.  Infection of skin and subcutaneous tissue -     Omadacycline Tosylate (NUZYRA) 150 MG TABS; Take 2 tablets by mouth daily for 7 days.  I have discontinued Colvin Caroli "Pat"'s Pfizer COVID-19 Vac Bivalent. I am  also having her start on [place]. Additionally, I am having her maintain her fluticasone, b complex vitamins, Azelaic Acid, acetaminophen, triamcinolone cream, Loratadine, Vitamin D, diclofenac Sodium, Mucinex, omeprazole, dicyclomine, hydroxychloroquine, LORazepam, DULoxetine, aspirin, amLODipine, atorvastatin, traMADol, losartan, Fluad Quadrivalent, furosemide, and Nuzyra.  Meds ordered this encounter  Medications   NUZYRA 150 MG TABS    Sig: Take 3 tablets by mouth daily for 1 day.    Dispense:  6 tablet    Refill:  0   DISCONTD: Omadacycline Tosylate (NUZYRA) 150 MG TABS    Sig: Take 2 tablets by mouth daily for 7 days.    Dispense:  14 tablet    Refill:  0   Omadacycline Tosylate (NUZYRA) 150 MG TABS    Sig: Take 2 tablets by mouth daily for 7 days.    Dispense:  14 tablet    Refill:  0       Follow-up: Return in about 1 week (around 08/18/2021).  Scarlette Calico, MD

## 2021-08-11 NOTE — Telephone Encounter (Signed)
[facility] calling in  Says rx sent in was too expensive for patient  Wants to know if another diagnoses code can be sent in or another medication that may be cheaper for patient  Please call pharmacy back

## 2021-08-11 NOTE — Telephone Encounter (Signed)
Pt saw Dr. Ronnald Ramp and he rx [facility]) [place] [[phone]]. Requesting alternative.Marland KitchenAndee Poles

## 2021-08-12 ENCOUNTER — Encounter: Payer: Self-pay | Admitting: Internal Medicine

## 2021-08-12 DIAGNOSIS — T8149XA Infection following a procedure, other surgical site, initial encounter: Secondary | ICD-10-CM

## 2021-08-12 MED ORDER — NUZYRA 150 MG PO TABS: 3.0000 | ORAL_TABLET | Freq: Every day | ORAL | 0 refills | Status: AC

## 2021-08-12 NOTE — Telephone Encounter (Signed)
Rec'd msg in email....  From: [email or web address]> Sent: Friday, August 12, 2021 9:17 AM To: Eather Colas (LB) [email or web address]> Subject: Re: [place] favor: ???? Please forward to Dr. Judeen Hammans nurse  Pt initials M.W. Their prescription for Elesa Hacker is all set on "cover my meds" [facility] [place] [facility] the prior auth, coupon, etc. it's all ready  She needs to go in and put code # [id number] into cover my meds & patients birthday  They're ready to fee [employer] it today Any questions call [facility] [phone]  Thank you, thank you, thank you ???? Christia Reading [phone]

## 2021-08-12 NOTE — Telephone Encounter (Signed)
Patient calling to check status of request  *see below*

## 2021-08-12 NOTE — Telephone Encounter (Signed)
Went on cover my meds... put in code # [id number]. Rec'd msg  DO NOT MATCH or CAN LOCATE PATIENT. Submitted PA under [place] " Prior Authorization Not Required". Called pharmacist to clarify what is needed. Spoke w/ Erlene Quan he states that she didn't need a PA. The copay was to high $1000. So they got approve under [place] and will cover. The issue is that the medication will not leave until Monday, and pt was on her last dose today. If we had sample of the medication she would just need 1 box until she received in mail..Check for sample nd we did have some. Called pt inform her the status. Left 1 box up front fr pick=up.Marland KitchenJohny Chess

## 2021-08-15 DIAGNOSIS — M25562 Pain in left knee: Secondary | ICD-10-CM | POA: Diagnosis not present

## 2021-08-16 LAB — VITAMIN B1: Vitamin B1 (Thiamine): 129 nmol/L — ABNORMAL HIGH (ref 8–30)

## 2021-08-17 DIAGNOSIS — M25562 Pain in left knee: Secondary | ICD-10-CM | POA: Diagnosis not present

## 2021-08-22 DIAGNOSIS — M25562 Pain in left knee: Secondary | ICD-10-CM | POA: Diagnosis not present

## 2021-08-23 ENCOUNTER — Encounter: Payer: Self-pay | Admitting: Internal Medicine

## 2021-08-23 ENCOUNTER — Other Ambulatory Visit: Payer: Self-pay

## 2021-08-23 ENCOUNTER — Ambulatory Visit (INDEPENDENT_AMBULATORY_CARE_PROVIDER_SITE_OTHER): Payer: Medicare Other | Admitting: Internal Medicine

## 2021-08-23 VITALS — BP 148/90 | HR 81 | Temp 97.8°F | Ht 60.0 in | Wt 165.0 lb

## 2021-08-23 DIAGNOSIS — I25119 Atherosclerotic heart disease of native coronary artery with unspecified angina pectoris: Secondary | ICD-10-CM | POA: Diagnosis not present

## 2021-08-23 DIAGNOSIS — R6883 Chills (without fever): Secondary | ICD-10-CM

## 2021-08-23 DIAGNOSIS — I10 Essential (primary) hypertension: Secondary | ICD-10-CM

## 2021-08-23 DIAGNOSIS — T8149XA Infection following a procedure, other surgical site, initial encounter: Secondary | ICD-10-CM | POA: Diagnosis not present

## 2021-08-23 LAB — CBC WITH DIFFERENTIAL/PLATELET
Basophils Absolute: 0 10*3/uL (ref 0.0–0.1)
Basophils Relative: 0.8 % (ref 0.0–3.0)
Eosinophils Absolute: 0.1 10*3/uL (ref 0.0–0.7)
Eosinophils Relative: 2.4 % (ref 0.0–5.0)
HCT: 35.4 % — ABNORMAL LOW (ref 36.0–46.0)
Hemoglobin: 11.4 g/dL — ABNORMAL LOW (ref 12.0–15.0)
Lymphocytes Relative: 35.3 % (ref 12.0–46.0)
Lymphs Abs: 1.9 10*3/uL (ref 0.7–4.0)
MCHC: 32.3 g/dL (ref 30.0–36.0)
MCV: 86.9 fl (ref 78.0–100.0)
Monocytes Absolute: 0.5 10*3/uL (ref 0.1–1.0)
Monocytes Relative: 8.7 % (ref 3.0–12.0)
Neutro Abs: 2.8 10*3/uL (ref 1.4–7.7)
Neutrophils Relative %: 52.8 % (ref 43.0–77.0)
Platelets: 272 10*3/uL (ref 150.0–400.0)
RBC: 4.07 Mil/uL (ref 3.87–5.11)
RDW: 14.1 % (ref 11.5–15.5)
WBC: 5.2 10*3/uL (ref 4.0–10.5)

## 2021-08-23 LAB — C-REACTIVE PROTEIN: CRP: <1 mg/dL (ref 0.5–20.0)

## 2021-08-23 LAB — SEDIMENTATION RATE: Sed Rate: 20 mm/hr (ref 0–30)

## 2021-08-23 NOTE — Assessment & Plan Note (Signed)
Exam resolved, no further indication for antibx for now,  to f/u any worsening symptoms or concerns

## 2021-08-23 NOTE — Assessment & Plan Note (Signed)
BP Readings from Last 3 Encounters:  08/23/21 (!) 148/90  08/11/21 140/90  06/14/21 128/82   Uncontrolled here, pt states < 140/90 at home, pt to continue medical treatment amlodipine, losartan, as declines change

## 2021-08-23 NOTE — Progress Notes (Signed)
Patient ID: Jennifer Davenport, female   DOB: 12/27/1950, 70 y.o.   MRN: [id number]        Chief Complaint: follow up left knee postop cellulitis       HPI:  Jennifer Davenport is a 70 y.o. female here to f/u as PCP not available, is s/p left knee TKR approx 6 wks, and unfortunately had episode left knee cellulitis as documented per Dr Ronnald Ramp, now finished antibx and doing overall well with resolved erythema/swelling tender to anterior left knee, but still not feeling better stamina and has occasional chills.   Pt denies fever, wt loss, night sweats, loss of appetite, or other constitutional symptoms  BP < 140/90 at home per pt.  Pt denies chest pain, increased sob or doe, wheezing, orthopnea, PND, increased LE swelling, palpitations, dizziness or syncope.  No ST, cough and Denies urinary symptoms such as dysuria, frequency, urgency, flank pain, hematuria or n/v, fever, chills.         Wt Readings from Last 3 Encounters:  08/23/21 165 lb (74.8 kg)  08/11/21 171 lb (77.6 kg)  06/14/21 171 lb 6.4 oz (77.7 kg)   BP Readings from Last 3 Encounters:  08/23/21 (!) 148/90  08/11/21 140/90  06/14/21 128/82         Past Medical History:  Diagnosis Date   Abnormal Pap smear of cervix    Allergy    Atrophic vaginitis    Bulging disc 10/2012   CERVICAL   Bursitis of hip    Cataract    mild starting of    Endometriosis    GERD (gastroesophageal reflux disease)    Hyperlipidemia    Hypertension    IBS (irritable bowel syndrome)    Past Surgical History:  Procedure Laterality Date   ABDOMINAL HYSTERECTOMY  11/1982   AUB & endometriosis, ovaries remain   APPENDECTOMY  1/72   BREAST LUMPECTOMY WITH RADIOACTIVE SEED LOCALIZATION Right 04/29/2021   Procedure: RIGHT BREAST LUMPECTOMY WITH RADIOACTIVE SEED LOCALIZATION;  Surgeon: Jovita Kussmaul, MD;  Location: [facility];  Service: General;  Laterality: Right;   BUNIONECTOMY Left 04/2004   left foot   CATARACT EXTRACTION     CERVIX  LESION DESTRUCTION     COLONOSCOPY     CRYOTHERAPY     for abnormal pap smear   eyelid surgery Bilateral 2018   HYSTEROTOMY  5/72   fetal demise, also BTL   LAPAROSCOPIC CHOLECYSTECTOMY  02/2005   POLYPECTOMY     TMJ ARTHROPLASTY  6/94   secondary to hockey injury 15 years earlier   TUBAL LIGATION Bilateral 5/72   UPPER GASTROINTESTINAL ENDOSCOPY      reports that she has never smoked. She has never used smokeless tobacco. She reports that she does not currently use alcohol. She reports that she does not use drugs. family history includes Breast cancer (age of onset: 1) in her mother; CVA in her mother; Colon polyps in her mother; Diverticulitis in her mother; Heart disease in her brother, maternal grandfather, maternal uncle, mother, and sister; Hypercholesterolemia in her mother; Hypertension in her brother, father, maternal grandfather, maternal uncle, mother, and sister; Lung cancer (age of onset: 15) in her brother; Osteoarthritis in her mother and sister. Allergies  Allergen Reactions   Ceftriaxone Sodium     Anaphylaxis shock Can take Amoxicillin ok   Levaquin [Levofloxacin] Anaphylaxis   Rocephin [Ceftriaxone] Anaphylaxis   Biaxin [Clarithromycin]     Chest pain   Sulfonamide Derivatives Nausea Only  Aspirin     Cramps and diarrhea   Benadryl [Diphenhydramine]     Not tolerating well   Ciprofloxacin     myalgias   Codeine     nausea   Dairy Aid [Tilactase]    Gluten Meal    Levaquin [Levofloxacin In D5w]     hives   Paxil [Paroxetine Hcl]     Headache   Pneumovax 23 [Pneumococcal Vac Polyvalent]     Rash    Rocephin [Ceftriaxone Sodium In Dextrose]    Simvastatin     Leg pains   Current Outpatient Medications on File Prior to Visit  Medication Sig Dispense Refill   acetaminophen (TYLENOL) 500 MG tablet Take 500 mg by mouth every 6 (six) hours as needed.     amLODipine (NORVASC) 5 MG tablet Take 1 tablet (5 mg total) by mouth daily. 90 tablet 3   aspirin 81  MG EC tablet Take 1 by mouth daily     atorvastatin (LIPITOR) 80 MG tablet TAKE ONE TABLET BY MOUTH DAILY AT 6PM 90 tablet 3   Azelaic Acid 15 % cream Apply topically 1 day or 1 dose. After skin is thoroughly washed and patted dry, gently but thoroughly massage a thin film of azelaic acid cream into the affected area twice daily, in the morning and evening.     b complex vitamins tablet Take 1 tablet by mouth daily.     Cholecalciferol (VITAMIN D) 50 MCG (2000 UT) tablet      diclofenac Sodium (VOLTAREN) 1 % GEL Apply 1 application topically 4 (four) times daily. 100 g 3   dicyclomine (BENTYL) 10 MG capsule TAKE 1 CAPSULE EVERY 6-8 HOURS AS NEEDED FOR CRAMPING AND SPASMS 90 capsule 1   DULoxetine (CYMBALTA) 20 MG capsule TAKE ONE CAPSULE BY MOUTH DAILY 90 capsule 3   fluticasone (FLONASE) 50 MCG/ACT nasal spray Place 1-2 sprays into both nostrils daily.      furosemide (LASIX) 20 MG tablet TAKE 1/2 TO 1 TABLET BY MOUTH DAILY AS NEEDED FOR EDEMA 90 tablet 0   guaiFENesin (MUCINEX) 600 MG 12 hr tablet Take 2 tablets (1,200 mg total) by mouth 2 (two) times daily as needed. 60 tablet 0   hydroxychloroquine (PLAQUENIL) 200 MG tablet Take 200 mg by mouth daily. Take 1 tablet a day     Loratadine 10 MG CAPS Take 1 by mouth daily as needed     LORazepam (ATIVAN) 1 MG tablet TAKE 1/2 TO 1 TABLET BY MOUTH TWO TIMES A DAY AS NEEDED FOR ANXIETY 30 tablet 2   losartan (COZAAR) 100 MG tablet TAKE ONE TABLET BY MOUTH DAILY 90 tablet 3   omeprazole (PRILOSEC) 10 MG capsule      traMADol (ULTRAM) 50 MG tablet Take 1 tablet (50 mg total) by mouth every 6 (six) hours as needed. 20 tablet 0   triamcinolone cream (KENALOG) 0.1 %      No current facility-administered medications on file prior to visit.        ROS:  All others reviewed and negative.  Objective        PE:  BP (!) 148/90 (BP Location: Left Arm, Patient Position: Sitting, Cuff Size: Large)   Pulse 81   Temp 97.8 F (36.6 C) (Oral)   Ht 5' (1.524  m)   Wt 165 lb (74.8 kg)   LMP 11/24/1982   SpO2 100%   BMI 32.22 kg/m  Constitutional: Pt appears in NAD               HENT: Head: NCAT.                Right Ear: External ear normal.                 Left Ear: External ear normal.                Eyes: . Pupils are equal, round, and reactive to light. Conjunctivae and EOM are normal               Nose: without d/c or deformity               Neck: Neck supple. Gross normal ROM               Cardiovascular: Normal rate and regular rhythm.                 Pulmonary/Chest: Effort normal and breath sounds without rales or wheezing.                Abd:  Soft, NT, ND, + BS, no organomegaly               Neurological: Pt is alert. At baseline orientation, motor grossly intact               Skin: Skin is warm. No rashes, no other new lesions, LE edema - none               Psychiatric: Pt behavior is normal without agitation   Micro: none  Cardiac tracings I have personally interpreted today:  none  Pertinent Radiological findings (summarize): none   Lab Results  Component Value Date   WBC 5.2 08/23/2021   HGB 11.4 (L) 08/23/2021   HCT 35.4 (L) 08/23/2021   PLT 272.0 08/23/2021   GLUCOSE 87 06/27/2021   CHOL 153 03/14/2021   TRIG 80.0 03/14/2021   HDL 82.30 03/14/2021   LDLCALC 55 03/14/2021   ALT 19 06/27/2021   AST 17 06/27/2021   NA 138 06/27/2021   K 4.2 06/27/2021   CL 104 06/27/2021   CREATININE 0.61 06/27/2021   BUN 12 06/27/2021   CO2 26 06/27/2021   TSH 2.25 06/27/2021   INR 1.0 06/27/2021   HGBA1C 5.9 06/27/2021   Assessment/Plan:  Jennifer Davenport is a 70 y.o. White or Caucasian [1] female with  has a past medical history of Abnormal Pap smear of cervix, Allergy, Atrophic vaginitis, Bulging disc (10/2012), Bursitis of hip, Cataract, Endometriosis, GERD (gastroesophageal reflux disease), Hyperlipidemia, Hypertension, and IBS (irritable bowel syndrome).  Chills Etiology unclear, exam benign, for  blood culture, cbc, esr and crp   Essential hypertension BP Readings from Last 3 Encounters:  08/23/21 (!) 148/90  08/11/21 140/90  06/14/21 128/82   Uncontrolled here, pt states < 140/90 at home, pt to continue medical treatment amlodipine, losartan, as declines change   Postoperative wound infection Exam resolved, no further indication for antibx for now,  to f/u any worsening symptoms or concerns  Followup: Return if symptoms worsen or fail to improve.  Cathlean Cower, MD 08/23/2021 8:21 PM [facility]

## 2021-08-23 NOTE — Assessment & Plan Note (Signed)
Etiology unclear, exam benign, for blood culture, cbc, esr and crp

## 2021-08-23 NOTE — Patient Instructions (Signed)
Your infection appears to have resolved  Please continue all other medications as before, and refills have been done if requested.  Please have the pharmacy call with any other refills you may need.  Please continue your efforts at being more active, low cholesterol diet, and weight control  Please keep your appointments with your specialists as you may have planned  Please go to the LAB at the blood drawing area for the tests to be done  You will be contacted by phone if any changes need to be made immediately.  Otherwise, you will receive a letter about your results with an explanation, but please check with MyChart first.  Please remember to sign up for MyChart if you have not done so, as this will be important to you in the future with finding out test results, communicating by private email, and scheduling acute appointments online when needed.  Please see your PCP next month as planned

## 2021-08-24 DIAGNOSIS — M25562 Pain in left knee: Secondary | ICD-10-CM | POA: Diagnosis not present

## 2021-08-26 DIAGNOSIS — M1712 Unilateral primary osteoarthritis, left knee: Secondary | ICD-10-CM | POA: Diagnosis not present

## 2021-08-26 DIAGNOSIS — Z4789 Encounter for other orthopedic aftercare: Secondary | ICD-10-CM | POA: Diagnosis not present

## 2021-08-26 DIAGNOSIS — Z96652 Presence of left artificial knee joint: Secondary | ICD-10-CM | POA: Diagnosis not present

## 2021-08-29 LAB — CULTURE, BLOOD (SINGLE)

## 2021-08-31 DIAGNOSIS — M150 Primary generalized (osteo)arthritis: Secondary | ICD-10-CM | POA: Diagnosis not present

## 2021-08-31 DIAGNOSIS — Z6831 Body mass index (BMI) 31.0-31.9, adult: Secondary | ICD-10-CM | POA: Diagnosis not present

## 2021-08-31 DIAGNOSIS — M310 Hypersensitivity angiitis: Secondary | ICD-10-CM | POA: Diagnosis not present

## 2021-08-31 DIAGNOSIS — M2550 Pain in unspecified joint: Secondary | ICD-10-CM | POA: Diagnosis not present

## 2021-08-31 DIAGNOSIS — Z79899 Other long term (current) drug therapy: Secondary | ICD-10-CM | POA: Diagnosis not present

## 2021-08-31 DIAGNOSIS — M0609 Rheumatoid arthritis without rheumatoid factor, multiple sites: Secondary | ICD-10-CM | POA: Diagnosis not present

## 2021-08-31 DIAGNOSIS — E669 Obesity, unspecified: Secondary | ICD-10-CM | POA: Diagnosis not present

## 2021-08-31 DIAGNOSIS — M25562 Pain in left knee: Secondary | ICD-10-CM | POA: Diagnosis not present

## 2021-09-01 DIAGNOSIS — Z20822 Contact with and (suspected) exposure to covid-19: Secondary | ICD-10-CM | POA: Diagnosis not present

## 2021-09-02 DIAGNOSIS — M25562 Pain in left knee: Secondary | ICD-10-CM | POA: Diagnosis not present

## 2021-09-06 DIAGNOSIS — M25562 Pain in left knee: Secondary | ICD-10-CM | POA: Diagnosis not present

## 2021-09-09 DIAGNOSIS — M25562 Pain in left knee: Secondary | ICD-10-CM | POA: Diagnosis not present

## 2021-09-12 DIAGNOSIS — M25562 Pain in left knee: Secondary | ICD-10-CM | POA: Diagnosis not present

## 2021-09-13 ENCOUNTER — Other Ambulatory Visit: Payer: Self-pay

## 2021-09-13 ENCOUNTER — Ambulatory Visit (INDEPENDENT_AMBULATORY_CARE_PROVIDER_SITE_OTHER): Payer: Medicare Other | Admitting: Internal Medicine

## 2021-09-13 ENCOUNTER — Encounter: Payer: Self-pay | Admitting: Internal Medicine

## 2021-09-13 DIAGNOSIS — R6883 Chills (without fever): Secondary | ICD-10-CM

## 2021-09-13 DIAGNOSIS — I25119 Atherosclerotic heart disease of native coronary artery with unspecified angina pectoris: Secondary | ICD-10-CM | POA: Diagnosis not present

## 2021-09-13 DIAGNOSIS — F410 Panic disorder [episodic paroxysmal anxiety] without agoraphobia: Secondary | ICD-10-CM

## 2021-09-13 DIAGNOSIS — G8929 Other chronic pain: Secondary | ICD-10-CM | POA: Diagnosis not present

## 2021-09-13 DIAGNOSIS — M25562 Pain in left knee: Secondary | ICD-10-CM | POA: Diagnosis not present

## 2021-09-13 DIAGNOSIS — I10 Essential (primary) hypertension: Secondary | ICD-10-CM

## 2021-09-13 MED ORDER — LORAZEPAM 1 MG PO TABS: ORAL_TABLET | ORAL | 2 refills | Status: DC

## 2021-09-13 NOTE — Assessment & Plan Note (Signed)
Resolved

## 2021-09-13 NOTE — Progress Notes (Signed)
Subjective:  Patient ID: Jennifer Davenport, female    DOB: 1951/03/20  Age: 70 y.o. MRN: [id number]  CC: Follow-up (3 month f/u)   HPI Jennifer Davenport presents for L TKR 07/14/21 - in PT. C/o pain - worse after PT sessions 7/10 F/u HTN, anxiety, dyslipidemia  Outpatient Medications Prior to Visit  Medication Sig Dispense Refill   acetaminophen (TYLENOL) 500 MG tablet Take 500 mg by mouth every 6 (six) hours as needed.     amLODipine (NORVASC) 5 MG tablet Take 1 tablet (5 mg total) by mouth daily. 90 tablet 3   aspirin 81 MG EC tablet Take 1 by mouth daily     atorvastatin (LIPITOR) 80 MG tablet TAKE ONE TABLET BY MOUTH DAILY AT 6PM 90 tablet 3   Azelaic Acid 15 % cream Apply topically 1 day or 1 dose. After skin is thoroughly washed and patted dry, gently but thoroughly massage a thin film of azelaic acid cream into the affected area twice daily, in the morning and evening.     b complex vitamins tablet Take 1 tablet by mouth daily.     Cholecalciferol (VITAMIN D) 50 MCG (2000 UT) tablet      diclofenac Sodium (VOLTAREN) 1 % GEL Apply 1 application topically 4 (four) times daily. 100 g 3   dicyclomine (BENTYL) 10 MG capsule TAKE 1 CAPSULE EVERY 6-8 HOURS AS NEEDED FOR CRAMPING AND SPASMS 90 capsule 1   DULoxetine (CYMBALTA) 20 MG capsule TAKE ONE CAPSULE BY MOUTH DAILY 90 capsule 3   fluticasone (FLONASE) 50 MCG/ACT nasal spray Place 1-2 sprays into both nostrils daily.      furosemide (LASIX) 20 MG tablet TAKE 1/2 TO 1 TABLET BY MOUTH DAILY AS NEEDED FOR EDEMA 90 tablet 0   guaiFENesin (MUCINEX) 600 MG 12 hr tablet Take 2 tablets (1,200 mg total) by mouth 2 (two) times daily as needed. 60 tablet 0   hydroxychloroquine (PLAQUENIL) 200 MG tablet Take 200 mg by mouth daily. Take 1 tablet a day     Loratadine 10 MG CAPS Take 1 by mouth daily as needed     losartan (COZAAR) 100 MG tablet TAKE ONE TABLET BY MOUTH DAILY 90 tablet 3   omeprazole (PRILOSEC) 10 MG capsule      traMADol  (ULTRAM) 50 MG tablet Take 1 tablet (50 mg total) by mouth every 6 (six) hours as needed. 20 tablet 0   triamcinolone cream (KENALOG) 0.1 %      LORazepam (ATIVAN) 1 MG tablet TAKE 1/2 TO 1 TABLET BY MOUTH TWO TIMES A DAY AS NEEDED FOR ANXIETY 30 tablet 2   No facility-administered medications prior to visit.    ROS: Review of Systems  Constitutional:  Negative for activity change, appetite change, chills, fatigue and unexpected weight change.  HENT:  Negative for congestion, mouth sores and sinus pressure.   Eyes:  Negative for visual disturbance.  Respiratory:  Negative for cough and chest tightness.   Gastrointestinal:  Negative for abdominal pain and nausea.  Genitourinary:  Negative for difficulty urinating, frequency and vaginal pain.  Musculoskeletal:  Positive for arthralgias and gait problem. Negative for back pain.  Skin:  Negative for pallor and rash.  Neurological:  Negative for dizziness, tremors, weakness, numbness and headaches.  Psychiatric/Behavioral:  Negative for confusion, sleep disturbance and suicidal ideas. The patient is nervous/anxious.    Objective:  BP 130/82 (BP Location: Left Arm)    Pulse 82    Temp 98 F (36.7  C) (Oral)    Ht 5' (1.524 m)    Wt 162 lb (73.5 kg)    LMP 11/24/1982    SpO2 96%    BMI 31.64 kg/m   BP Readings from Last 3 Encounters:  09/13/21 130/82  08/23/21 (!) 148/90  08/11/21 140/90    Wt Readings from Last 3 Encounters:  09/13/21 162 lb (73.5 kg)  08/23/21 165 lb (74.8 kg)  08/11/21 171 lb (77.6 kg)    Physical Exam Constitutional:      General: She is not in acute distress.    Appearance: She is well-developed.  HENT:     Head: Normocephalic.     Right Ear: External ear normal.     Left Ear: External ear normal.     Nose: Nose normal.  Eyes:     General:        Right eye: No discharge.        Left eye: No discharge.     Conjunctiva/sclera: Conjunctivae normal.     Pupils: Pupils are equal, round, and reactive to  light.  Neck:     Thyroid: No thyromegaly.     Vascular: No JVD.     Trachea: No tracheal deviation.  Cardiovascular:     Rate and Rhythm: Normal rate and regular rhythm.     Heart sounds: Normal heart sounds.  Pulmonary:     Effort: No respiratory distress.     Breath sounds: No stridor. No wheezing.  Abdominal:     General: Bowel sounds are normal. There is no distension.     Palpations: Abdomen is soft. There is no mass.     Tenderness: There is no abdominal tenderness. There is no guarding or rebound.  Musculoskeletal:        General: Tenderness present.     Cervical back: Normal range of motion and neck supple. No rigidity.  Lymphadenopathy:     Cervical: No cervical adenopathy.  Skin:    Findings: No erythema or rash.  Neurological:     Cranial Nerves: No cranial nerve deficit.     Motor: No abnormal muscle tone.     Coordination: Coordination normal.     Deep Tendon Reflexes: Reflexes normal.  Psychiatric:        Behavior: Behavior normal.        Thought Content: Thought content normal.        Judgment: Judgment normal.  L knee w/a healed scar; pain w/ROM  Lab Results  Component Value Date   WBC 5.2 08/23/2021   HGB 11.4 (L) 08/23/2021   HCT 35.4 (L) 08/23/2021   PLT 272.0 08/23/2021   GLUCOSE 87 06/27/2021   CHOL 153 03/14/2021   TRIG 80.0 03/14/2021   HDL 82.30 03/14/2021   LDLCALC 55 03/14/2021   ALT 19 06/27/2021   AST 17 06/27/2021   NA 138 06/27/2021   K 4.2 06/27/2021   CL 104 06/27/2021   CREATININE 0.61 06/27/2021   BUN 12 06/27/2021   CO2 26 06/27/2021   TSH 2.25 06/27/2021   INR 1.0 06/27/2021   HGBA1C 5.9 06/27/2021    DG Knee Complete 4 Views Left  Result Date: 08/11/2021 CLINICAL DATA:  Left knee pain/swelling EXAM: LEFT KNEE - COMPLETE 4+ VIEW COMPARISON:  None. FINDINGS: There is a 3 component total knee arthroplasty in normal alignment without evidence of loosening or periprosthetic fracture. There is cortical irregularity along the  medial joint margin at the prosthetic-bone interface in the medial femoral condyle and medial  tibial plateau. Small joint effusion. IMPRESSION: Normal 3 component total knee arthroplasty alignment without evidence of loosening or periprosthetic fracture. Small joint effusion. Cortical irregularity along the medial joint margin at the prosthetic-bone interface in the medial femoral condyle and medial tibial plateau, favored to be postoperative change. Correlation with immediate postoperative imaging, if available, would be useful. Electronically Signed   By: Maurine Simmering M.D.   On: 08/11/2021 12:12    Assessment & Plan:   Problem List Items Addressed This Visit     Anxiety attack    Take a low dose Tramadol - HA w>25 mg dose prior to PT Tylenol prn Clonazepam prn      Relevant Medications   LORazepam (ATIVAN) 1 MG tablet   CAD (coronary artery disease)    OK w/a low dose Tramadol - HA w>25 mg dose      Chills    Resolved      Essential hypertension    Cont on Amlodipine, Losartan      Knee pain, left    Use Tramadol 25 mg dose prn prior to PT         Meds ordered this encounter  Medications   LORazepam (ATIVAN) 1 MG tablet    Sig: TAKE 1/2 TO 1 TABLET BY MOUTH TWO TIMES A DAY AS NEEDED FOR ANXIETY    Dispense:  30 tablet    Refill:  2      Follow-up: No follow-ups on file.  Walker Kehr, MD

## 2021-09-13 NOTE — Assessment & Plan Note (Signed)
Take a low dose Tramadol - HA w>25 mg dose prior to PT Tylenol prn Clonazepam prn

## 2021-09-13 NOTE — Assessment & Plan Note (Signed)
Cont on Amlodipine, Losartan  

## 2021-09-13 NOTE — Assessment & Plan Note (Signed)
Use Tramadol 25 mg dose prn prior to PT

## 2021-09-13 NOTE — Assessment & Plan Note (Signed)
OK w/a low dose Tramadol - HA w>25 mg dose

## 2021-09-15 DIAGNOSIS — M25562 Pain in left knee: Secondary | ICD-10-CM | POA: Diagnosis not present

## 2021-09-21 DIAGNOSIS — M25562 Pain in left knee: Secondary | ICD-10-CM | POA: Diagnosis not present

## 2021-10-04 DIAGNOSIS — M25562 Pain in left knee: Secondary | ICD-10-CM | POA: Diagnosis not present

## 2021-10-07 DIAGNOSIS — Z20822 Contact with and (suspected) exposure to covid-19: Secondary | ICD-10-CM | POA: Diagnosis not present

## 2021-10-22 ENCOUNTER — Other Ambulatory Visit: Payer: Self-pay | Admitting: Internal Medicine

## 2021-10-26 DIAGNOSIS — M1711 Unilateral primary osteoarthritis, right knee: Secondary | ICD-10-CM | POA: Diagnosis not present

## 2021-11-02 DIAGNOSIS — M1711 Unilateral primary osteoarthritis, right knee: Secondary | ICD-10-CM | POA: Diagnosis not present

## 2021-11-09 DIAGNOSIS — M1711 Unilateral primary osteoarthritis, right knee: Secondary | ICD-10-CM | POA: Diagnosis not present

## 2021-11-18 DIAGNOSIS — Z20822 Contact with and (suspected) exposure to covid-19: Secondary | ICD-10-CM | POA: Diagnosis not present

## 2021-12-13 DIAGNOSIS — Z20822 Contact with and (suspected) exposure to covid-19: Secondary | ICD-10-CM | POA: Diagnosis not present

## 2021-12-14 ENCOUNTER — Encounter: Payer: Self-pay | Admitting: Internal Medicine

## 2021-12-14 ENCOUNTER — Other Ambulatory Visit: Payer: Self-pay

## 2021-12-14 ENCOUNTER — Ambulatory Visit (INDEPENDENT_AMBULATORY_CARE_PROVIDER_SITE_OTHER): Payer: Medicare Other | Admitting: Internal Medicine

## 2021-12-14 DIAGNOSIS — K9041 Non-celiac gluten sensitivity: Secondary | ICD-10-CM

## 2021-12-14 DIAGNOSIS — F410 Panic disorder [episodic paroxysmal anxiety] without agoraphobia: Secondary | ICD-10-CM | POA: Diagnosis not present

## 2021-12-14 DIAGNOSIS — I10 Essential (primary) hypertension: Secondary | ICD-10-CM

## 2021-12-14 DIAGNOSIS — E785 Hyperlipidemia, unspecified: Secondary | ICD-10-CM | POA: Diagnosis not present

## 2021-12-14 LAB — LIPID PANEL
Cholesterol: 147 mg/dL (ref 0–200)
HDL: 84.9 mg/dL (ref >39.00)
LDL Cholesterol: 45 mg/dL (ref 0–99)
NonHDL: 62.39
Total CHOL/HDL Ratio: 2
Triglycerides: 85 mg/dL (ref 0.0–149.0)
VLDL: 17 mg/dL (ref 0.0–40.0)

## 2021-12-14 LAB — CBC WITH DIFFERENTIAL/PLATELET
Basophils Absolute: 0.1 10*3/uL (ref 0.0–0.1)
Basophils Relative: 0.9 % (ref 0.0–3.0)
Eosinophils Absolute: 0.2 10*3/uL (ref 0.0–0.7)
Eosinophils Relative: 3.2 % (ref 0.0–5.0)
HCT: 37 % (ref 36.0–46.0)
Hemoglobin: 12.1 g/dL (ref 12.0–15.0)
Lymphocytes Relative: 29.2 % (ref 12.0–46.0)
Lymphs Abs: 1.7 10*3/uL (ref 0.7–4.0)
MCHC: 32.7 g/dL (ref 30.0–36.0)
MCV: 85.7 fl (ref 78.0–100.0)
Monocytes Absolute: 0.5 10*3/uL (ref 0.1–1.0)
Monocytes Relative: 9 % (ref 3.0–12.0)
Neutro Abs: 3.5 10*3/uL (ref 1.4–7.7)
Neutrophils Relative %: 57.7 % (ref 43.0–77.0)
Platelets: 266 10*3/uL (ref 150.0–400.0)
RBC: 4.31 Mil/uL (ref 3.87–5.11)
RDW: 14.3 % (ref 11.5–15.5)
WBC: 6 10*3/uL (ref 4.0–10.5)

## 2021-12-14 LAB — COMPREHENSIVE METABOLIC PANEL
ALT: 17 U/L (ref 0–35)
AST: 19 U/L (ref 0–37)
Albumin: 4.5 g/dL (ref 3.5–5.2)
Alkaline Phosphatase: 65 U/L (ref 39–117)
BUN: 11 mg/dL (ref 6–23)
CO2: 29 mEq/L (ref 19–32)
Calcium: 9.4 mg/dL (ref 8.4–10.5)
Chloride: 103 mEq/L (ref 96–112)
Creatinine, Ser: 0.58 mg/dL (ref 0.40–1.20)
GFR: 92.28 mL/min (ref >60.00)
Glucose, Bld: 88 mg/dL (ref 70–99)
Potassium: 4 mEq/L (ref 3.5–5.1)
Sodium: 137 mEq/L (ref 135–145)
Total Bilirubin: 0.5 mg/dL (ref 0.2–1.2)
Total Protein: 7.7 g/dL (ref 6.0–8.3)

## 2021-12-14 NOTE — Assessment & Plan Note (Signed)
Lorazepam prn ? Potential benefits of a long term benzodiazepines  use as well as potential risks  and complications were explained to the patient and were aknowledged. ?On a low dose Cymbalta ?

## 2021-12-14 NOTE — Assessment & Plan Note (Signed)
Chronic ?Cont on Amlodipine, Losartan ?

## 2021-12-14 NOTE — Assessment & Plan Note (Signed)
Chronic ?On gluten free, milk free and lectin free diet ?

## 2021-12-14 NOTE — Progress Notes (Signed)
? ?Subjective:  ?Patient ID: Jennifer Davenport, female    DOB: 11/28/1950  Age: 71 y.o. MRN: [id number] ? ?CC: Follow-up (No concerns/) ? ? ?HPI ?Jennifer Davenport presents for gluten intolerance, HTN, OA ? ?Outpatient Medications Prior to Visit  ?Medication Sig Dispense Refill  ? acetaminophen (TYLENOL) 500 MG tablet Take 500 mg by mouth every 6 (six) hours as needed.    ? amLODipine (NORVASC) 5 MG tablet Take 1 tablet (5 mg total) by mouth daily. 90 tablet 3  ? aspirin 81 MG EC tablet Take 1 by mouth daily    ? atorvastatin (LIPITOR) 80 MG tablet TAKE ONE TABLET BY MOUTH DAILY AT 6PM 90 tablet 3  ? Azelaic Acid 15 % cream Apply topically 1 day or 1 dose. After skin is thoroughly washed and patted dry, gently but thoroughly massage a thin film of azelaic acid cream into the affected area twice daily, in the morning and evening.    ? b complex vitamins tablet Take 1 tablet by mouth daily.    ? Cholecalciferol (VITAMIN D) 50 MCG (2000 UT) tablet     ? diclofenac Sodium (VOLTAREN) 1 % GEL Apply 1 application topically 4 (four) times daily. 100 g 3  ? dicyclomine (BENTYL) 10 MG capsule TAKE 1 CAPSULE EVERY 6-8 HOURS AS NEEDED FOR CRAMPING AND SPASMS 90 capsule 1  ? DULoxetine (CYMBALTA) 20 MG capsule TAKE ONE CAPSULE BY MOUTH DAILY 90 capsule 3  ? fluticasone (FLONASE) 50 MCG/ACT nasal spray Place 1-2 sprays into both nostrils daily.     ? furosemide (LASIX) 20 MG tablet TAKE 1/2 TO 1 TABLET BY MOUTH DAILY AS NEEDED FOR EDEMA 90 tablet 0  ? guaiFENesin (MUCINEX) 600 MG 12 hr tablet Take 2 tablets (1,200 mg total) by mouth 2 (two) times daily as needed. 60 tablet 0  ? hydroxychloroquine (PLAQUENIL) 200 MG tablet Take 200 mg by mouth daily. Take 1 tablet a day    ? Loratadine 10 MG CAPS Take 1 by mouth daily as needed    ? LORazepam (ATIVAN) 1 MG tablet TAKE 1/2 TO 1 TABLET BY MOUTH TWO TIMES A DAY AS NEEDED FOR ANXIETY 30 tablet 2  ? losartan (COZAAR) 100 MG tablet TAKE ONE TABLET BY MOUTH DAILY 90 tablet 3  ?  omeprazole (PRILOSEC) 10 MG capsule     ? traMADol (ULTRAM) 50 MG tablet Take 1 tablet (50 mg total) by mouth every 6 (six) hours as needed. 20 tablet 0  ? triamcinolone cream (KENALOG) 0.1 %     ? ?No facility-administered medications prior to visit.  ? ? ?ROS: ?Review of Systems ? ?Objective:  ?BP 136/78   Pulse 83   Temp 98.5 ?F (36.9 ?C) (Oral)   Ht '5\' 1"'$  (1.549 m)   Wt 167 lb 8 oz (76 kg)   LMP 11/24/1982   SpO2 99%   BMI 31.65 kg/m?  ? ?BP Readings from Last 3 Encounters:  ?12/14/21 136/78  ?09/13/21 130/82  ?08/23/21 (!) 148/90  ? ? ?Wt Readings from Last 3 Encounters:  ?12/14/21 167 lb 8 oz (76 kg)  ?09/13/21 162 lb (73.5 kg)  ?08/23/21 165 lb (74.8 kg)  ? ? ?Physical Exam ? ?Lab Results  ?Component Value Date  ? WBC 5.2 08/23/2021  ? HGB 11.4 (L) 08/23/2021  ? HCT 35.4 (L) 08/23/2021  ? PLT 272.0 08/23/2021  ? GLUCOSE 87 06/27/2021  ? CHOL 153 03/14/2021  ? TRIG 80.0 03/14/2021  ? HDL 82.30 03/14/2021  ?  [place] 55 03/14/2021  ? ALT 19 06/27/2021  ? AST 17 06/27/2021  ? NA 138 06/27/2021  ? K 4.2 06/27/2021  ? CL 104 06/27/2021  ? CREATININE 0.61 06/27/2021  ? BUN 12 06/27/2021  ? CO2 26 06/27/2021  ? TSH 2.25 06/27/2021  ? INR 1.0 06/27/2021  ? HGBA1C 5.9 06/27/2021  ? ? ?DG Knee Complete 4 Views Left ? ?Result Date: 08/11/2021 ?CLINICAL DATA:  Left knee pain/swelling EXAM: LEFT KNEE - COMPLETE 4+ VIEW COMPARISON:  None. FINDINGS: There is a 3 component total knee arthroplasty in normal alignment without evidence of loosening or periprosthetic fracture. There is cortical irregularity along the medial joint margin at the prosthetic-bone interface in the medial femoral condyle and medial tibial plateau. Small joint effusion. IMPRESSION: Normal 3 component total knee arthroplasty alignment without evidence of loosening or periprosthetic fracture. Small joint effusion. Cortical irregularity along the medial joint margin at the prosthetic-bone interface in the medial femoral condyle and medial tibial  plateau, favored to be postoperative change. Correlation with immediate postoperative imaging, if available, would be useful. Electronically Signed   By: Maurine Simmering M.D.   On: 08/11/2021 12:12  ? ? ?Assessment & Plan:  ? ?Problem List Items Addressed This Visit   ? ? Dyslipidemia  ?  Cont on Lipitor ?  ?  ? Relevant Orders  ? CBC with Differential/Platelet  ? Comprehensive metabolic panel  ? Lipid panel  ? Essential hypertension  ?  Chronic ?Cont on Amlodipine, Losartan ?  ?  ? Relevant Orders  ? CBC with Differential/Platelet  ? Comprehensive metabolic panel  ? Lipid panel  ? Gluten intolerance  ?  Chronic ?On gluten free, milk free and lectin free diet ?  ?  ? Anxiety attack  ?  Lorazepam prn ? Potential benefits of a long term benzodiazepines  use as well as potential risks  and complications were explained to the patient and were aknowledged. ?On a low dose Cymbalta ?  ?  ?  ? ? ?No orders of the defined types were placed in this encounter. ?  ? ? ?Follow-up: Return in about 6 months (around 06/16/2022) for Wellness Exam. ? ?Walker Kehr, MD ? ?

## 2021-12-14 NOTE — Assessment & Plan Note (Signed)
Cont on Lipitor 

## 2022-01-05 DIAGNOSIS — Z20828 Contact with and (suspected) exposure to other viral communicable diseases: Secondary | ICD-10-CM | POA: Diagnosis not present

## 2022-01-06 DIAGNOSIS — Z20822 Contact with and (suspected) exposure to covid-19: Secondary | ICD-10-CM | POA: Diagnosis not present

## 2022-01-06 DIAGNOSIS — M1711 Unilateral primary osteoarthritis, right knee: Secondary | ICD-10-CM | POA: Diagnosis not present

## 2022-01-06 DIAGNOSIS — Z96652 Presence of left artificial knee joint: Secondary | ICD-10-CM | POA: Diagnosis not present

## 2022-01-06 DIAGNOSIS — Z471 Aftercare following joint replacement surgery: Secondary | ICD-10-CM | POA: Diagnosis not present

## 2022-01-09 ENCOUNTER — Encounter: Payer: Self-pay | Admitting: Internal Medicine

## 2022-01-11 ENCOUNTER — Encounter (HOSPITAL_COMMUNITY): Payer: Self-pay

## 2022-01-11 DIAGNOSIS — Z20822 Contact with and (suspected) exposure to covid-19: Secondary | ICD-10-CM | POA: Diagnosis not present

## 2022-01-13 ENCOUNTER — Telehealth: Payer: Self-pay | Admitting: Internal Medicine

## 2022-01-13 DIAGNOSIS — Z20822 Contact with and (suspected) exposure to covid-19: Secondary | ICD-10-CM | POA: Diagnosis not present

## 2022-01-13 NOTE — Telephone Encounter (Signed)
[facility] called and needs the last office note faxed to them on this patient ?

## 2022-01-13 NOTE — Telephone Encounter (Signed)
Office note faxed.

## 2022-01-17 NOTE — Telephone Encounter (Signed)
Carrie from [facility] states she has received clearance, but needs recent office visits and labs sent over.  ? ?Fax #- [phone] ?

## 2022-01-17 NOTE — Telephone Encounter (Signed)
Faxed over OV  w/labs to [phone]..Chryl Heck ?

## 2022-01-19 ENCOUNTER — Other Ambulatory Visit: Payer: Self-pay | Admitting: Internal Medicine

## 2022-01-20 DIAGNOSIS — R059 Cough, unspecified: Secondary | ICD-10-CM | POA: Diagnosis not present

## 2022-01-20 DIAGNOSIS — R051 Acute cough: Secondary | ICD-10-CM | POA: Diagnosis not present

## 2022-01-20 DIAGNOSIS — Z20822 Contact with and (suspected) exposure to covid-19: Secondary | ICD-10-CM | POA: Diagnosis not present

## 2022-01-21 ENCOUNTER — Other Ambulatory Visit: Payer: Self-pay | Admitting: Internal Medicine

## 2022-01-26 DIAGNOSIS — L821 Other seborrheic keratosis: Secondary | ICD-10-CM | POA: Diagnosis not present

## 2022-01-26 DIAGNOSIS — D1722 Benign lipomatous neoplasm of skin and subcutaneous tissue of left arm: Secondary | ICD-10-CM | POA: Diagnosis not present

## 2022-01-26 DIAGNOSIS — L920 Granuloma annulare: Secondary | ICD-10-CM | POA: Diagnosis not present

## 2022-01-26 DIAGNOSIS — L570 Actinic keratosis: Secondary | ICD-10-CM | POA: Diagnosis not present

## 2022-02-06 DIAGNOSIS — D689 Coagulation defect, unspecified: Secondary | ICD-10-CM | POA: Diagnosis not present

## 2022-02-06 DIAGNOSIS — Z01818 Encounter for other preprocedural examination: Secondary | ICD-10-CM | POA: Diagnosis not present

## 2022-02-06 DIAGNOSIS — E0821 Diabetes mellitus due to underlying condition with diabetic nephropathy: Secondary | ICD-10-CM | POA: Diagnosis not present

## 2022-02-10 ENCOUNTER — Ambulatory Visit: Payer: Medicare Other | Admitting: Internal Medicine

## 2022-02-14 DIAGNOSIS — G8918 Other acute postprocedural pain: Secondary | ICD-10-CM | POA: Diagnosis not present

## 2022-02-14 DIAGNOSIS — M1711 Unilateral primary osteoarthritis, right knee: Secondary | ICD-10-CM | POA: Diagnosis not present

## 2022-02-17 DIAGNOSIS — M25561 Pain in right knee: Secondary | ICD-10-CM | POA: Diagnosis not present

## 2022-02-21 DIAGNOSIS — M25561 Pain in right knee: Secondary | ICD-10-CM | POA: Diagnosis not present

## 2022-02-23 DIAGNOSIS — M25561 Pain in right knee: Secondary | ICD-10-CM | POA: Diagnosis not present

## 2022-02-27 DIAGNOSIS — M25561 Pain in right knee: Secondary | ICD-10-CM | POA: Diagnosis not present

## 2022-03-01 DIAGNOSIS — M25561 Pain in right knee: Secondary | ICD-10-CM | POA: Diagnosis not present

## 2022-03-01 DIAGNOSIS — Z4789 Encounter for other orthopedic aftercare: Secondary | ICD-10-CM | POA: Diagnosis not present

## 2022-03-03 DIAGNOSIS — M25561 Pain in right knee: Secondary | ICD-10-CM | POA: Diagnosis not present

## 2022-03-06 DIAGNOSIS — M25561 Pain in right knee: Secondary | ICD-10-CM | POA: Diagnosis not present

## 2022-03-08 DIAGNOSIS — M25561 Pain in right knee: Secondary | ICD-10-CM | POA: Diagnosis not present

## 2022-03-10 DIAGNOSIS — M25561 Pain in right knee: Secondary | ICD-10-CM | POA: Diagnosis not present

## 2022-03-13 DIAGNOSIS — M25561 Pain in right knee: Secondary | ICD-10-CM | POA: Diagnosis not present

## 2022-03-16 DIAGNOSIS — M25561 Pain in right knee: Secondary | ICD-10-CM | POA: Diagnosis not present

## 2022-03-20 DIAGNOSIS — M25561 Pain in right knee: Secondary | ICD-10-CM | POA: Diagnosis not present

## 2022-03-23 DIAGNOSIS — M25561 Pain in right knee: Secondary | ICD-10-CM | POA: Diagnosis not present

## 2022-04-04 DIAGNOSIS — M25561 Pain in right knee: Secondary | ICD-10-CM | POA: Diagnosis not present

## 2022-04-06 DIAGNOSIS — M25561 Pain in right knee: Secondary | ICD-10-CM | POA: Diagnosis not present

## 2022-04-11 DIAGNOSIS — M25561 Pain in right knee: Secondary | ICD-10-CM | POA: Diagnosis not present

## 2022-04-13 DIAGNOSIS — M25561 Pain in right knee: Secondary | ICD-10-CM | POA: Diagnosis not present

## 2022-04-18 DIAGNOSIS — M25561 Pain in right knee: Secondary | ICD-10-CM | POA: Diagnosis not present

## 2022-04-20 DIAGNOSIS — M310 Hypersensitivity angiitis: Secondary | ICD-10-CM | POA: Diagnosis not present

## 2022-04-20 DIAGNOSIS — M0609 Rheumatoid arthritis without rheumatoid factor, multiple sites: Secondary | ICD-10-CM | POA: Diagnosis not present

## 2022-04-20 DIAGNOSIS — Z6832 Body mass index (BMI) 32.0-32.9, adult: Secondary | ICD-10-CM | POA: Diagnosis not present

## 2022-04-20 DIAGNOSIS — E669 Obesity, unspecified: Secondary | ICD-10-CM | POA: Diagnosis not present

## 2022-04-20 DIAGNOSIS — M25561 Pain in right knee: Secondary | ICD-10-CM | POA: Diagnosis not present

## 2022-04-20 DIAGNOSIS — M1991 Primary osteoarthritis, unspecified site: Secondary | ICD-10-CM | POA: Diagnosis not present

## 2022-04-21 ENCOUNTER — Other Ambulatory Visit: Payer: Self-pay | Admitting: Internal Medicine

## 2022-04-27 DIAGNOSIS — M25561 Pain in right knee: Secondary | ICD-10-CM | POA: Diagnosis not present

## 2022-05-04 DIAGNOSIS — M25561 Pain in right knee: Secondary | ICD-10-CM | POA: Diagnosis not present

## 2022-05-10 DIAGNOSIS — Z4789 Encounter for other orthopedic aftercare: Secondary | ICD-10-CM | POA: Diagnosis not present

## 2022-05-17 ENCOUNTER — Other Ambulatory Visit: Payer: Self-pay | Admitting: Internal Medicine

## 2022-05-17 DIAGNOSIS — Z1231 Encounter for screening mammogram for malignant neoplasm of breast: Secondary | ICD-10-CM

## 2022-05-18 ENCOUNTER — Telehealth: Payer: Self-pay | Admitting: Internal Medicine

## 2022-05-18 NOTE — Telephone Encounter (Signed)
Left message for patient to call back to schedule Medicare Annual Wellness Visit   Last AWV  03/04/18  Please schedule at anytime with [facility] if patient calls the office back.     Any questions, please call me at [phone]

## 2022-05-27 ENCOUNTER — Other Ambulatory Visit: Payer: Self-pay | Admitting: Internal Medicine

## 2022-06-05 DIAGNOSIS — Z961 Presence of intraocular lens: Secondary | ICD-10-CM | POA: Diagnosis not present

## 2022-06-05 DIAGNOSIS — Z79899 Other long term (current) drug therapy: Secondary | ICD-10-CM | POA: Diagnosis not present

## 2022-06-05 DIAGNOSIS — H524 Presbyopia: Secondary | ICD-10-CM | POA: Diagnosis not present

## 2022-06-05 DIAGNOSIS — H5212 Myopia, left eye: Secondary | ICD-10-CM | POA: Diagnosis not present

## 2022-06-05 DIAGNOSIS — H31003 Unspecified chorioretinal scars, bilateral: Secondary | ICD-10-CM | POA: Diagnosis not present

## 2022-06-05 DIAGNOSIS — H26493 Other secondary cataract, bilateral: Secondary | ICD-10-CM | POA: Diagnosis not present

## 2022-06-05 LAB — HM DIABETES EYE EXAM

## 2022-06-06 ENCOUNTER — Ambulatory Visit
Admission: RE | Admit: 2022-06-06 | Discharge: 2022-06-06 | Disposition: A | Discharge status: Home / Self Care | Payer: Medicare Other | Source: Ambulatory Visit | Attending: Internal Medicine | Admitting: Internal Medicine

## 2022-06-06 DIAGNOSIS — Z1231 Encounter for screening mammogram for malignant neoplasm of breast: Secondary | ICD-10-CM | POA: Diagnosis not present

## 2022-06-19 ENCOUNTER — Ambulatory Visit (INDEPENDENT_AMBULATORY_CARE_PROVIDER_SITE_OTHER): Payer: Medicare Other

## 2022-06-19 ENCOUNTER — Ambulatory Visit (INDEPENDENT_AMBULATORY_CARE_PROVIDER_SITE_OTHER): Payer: Medicare Other | Admitting: Internal Medicine

## 2022-06-19 ENCOUNTER — Encounter: Payer: Self-pay | Admitting: Internal Medicine

## 2022-06-19 DIAGNOSIS — F410 Panic disorder [episodic paroxysmal anxiety] without agoraphobia: Secondary | ICD-10-CM

## 2022-06-19 DIAGNOSIS — M159 Polyosteoarthritis, unspecified: Secondary | ICD-10-CM

## 2022-06-19 DIAGNOSIS — G4700 Insomnia, unspecified: Secondary | ICD-10-CM | POA: Diagnosis not present

## 2022-06-19 DIAGNOSIS — I10 Essential (primary) hypertension: Secondary | ICD-10-CM | POA: Diagnosis not present

## 2022-06-19 DIAGNOSIS — Z0000 Encounter for general adult medical examination without abnormal findings: Secondary | ICD-10-CM | POA: Diagnosis not present

## 2022-06-19 MED ORDER — LORAZEPAM 1 MG PO TABS: ORAL_TABLET | ORAL | 3 refills | Status: DC

## 2022-06-19 NOTE — Assessment & Plan Note (Signed)
S/p B TKR by Dr Theda Sers

## 2022-06-19 NOTE — Assessment & Plan Note (Signed)
Cymbalta anxiety attacks w/SOB - seldom Lorazepam prn  Potential benefits of a long term benzodiazepines  use as well as potential risks  and complications were explained to the patient and were aknowledged. Reduce caffeine

## 2022-06-19 NOTE — Assessment & Plan Note (Signed)
Chronic Lorazepam prn  Potential benefits of a long term benzodiazepines  use as well as potential risks  and complications were explained to the patient and were aknowledged.

## 2022-06-19 NOTE — Assessment & Plan Note (Signed)
Cont on Amlodipine, Losartan  

## 2022-06-19 NOTE — Progress Notes (Signed)
Subjective:  Patient ID: Jennifer Davenport, female    DOB: November 14, 1950  Age: 71 y.o. MRN: [id number]  CC: Follow-up (6 MONTH F/U)   HPI Lux Skilton presents for HTN, anxiety, depression S/p B TKR by Dr Theda Sers  Outpatient Medications Prior to Visit  Medication Sig Dispense Refill   acetaminophen (TYLENOL) 500 MG tablet Take 500 mg by mouth every 6 (six) hours as needed.     amLODipine (NORVASC) 5 MG tablet TAKE ONE TABLET BY MOUTH DAILY 90 tablet 3   aspirin 81 MG EC tablet Take 1 by mouth daily     atorvastatin (LIPITOR) 80 MG tablet TAKE ONE TABLET BY MOUTH DAILY AT 6:00PM 90 tablet 3   Azelaic Acid 15 % cream Apply topically 1 day or 1 dose. After skin is thoroughly washed and patted dry, gently but thoroughly massage a thin film of azelaic acid cream into the affected area twice daily, in the morning and evening.     b complex vitamins tablet Take 1 tablet by mouth daily.     Cholecalciferol (VITAMIN D) 50 MCG (2000 UT) tablet      diclofenac Sodium (VOLTAREN) 1 % GEL Apply 1 application topically 4 (four) times daily. 100 g 3   dicyclomine (BENTYL) 10 MG capsule TAKE ONE CAPSULE BY MOUTH EVERY 6 TO 8 HOURS AS NEEDED FOR CRAMPING AND SPASMS 90 capsule 1   DULoxetine (CYMBALTA) 20 MG capsule TAKE ONE CAPSULE BY MOUTH DAILY 90 capsule 3   fluticasone (FLONASE) 50 MCG/ACT nasal spray Place 1-2 sprays into both nostrils daily.      furosemide (LASIX) 20 MG tablet TAKE 1/2 TO 1 TABLET BY MOUTH DAILY AS NEEDED FOR EDEMA 90 tablet 0   guaiFENesin (MUCINEX) 600 MG 12 hr tablet Take 2 tablets (1,200 mg total) by mouth 2 (two) times daily as needed. 60 tablet 0   hydroxychloroquine (PLAQUENIL) 200 MG tablet Take 200 mg by mouth daily. Take 1 tablet a day     Loratadine 10 MG CAPS Take 1 by mouth daily as needed     losartan (COZAAR) 100 MG tablet TAKE ONE TABLET BY MOUTH DAILY 90 tablet 1   omeprazole (PRILOSEC) 10 MG capsule      triamcinolone cream (KENALOG) 0.1 %       LORazepam (ATIVAN) 1 MG tablet TAKE 1/2 TO 1 TABLET BY MOUTH TWO TIMES A DAY AS NEEDED FOR ANXIETY 30 tablet 2   No facility-administered medications prior to visit.    ROS: Review of Systems  Constitutional:  Negative for activity change, appetite change, chills, fatigue and unexpected weight change.  HENT:  Negative for congestion, mouth sores and sinus pressure.   Eyes:  Negative for visual disturbance.  Respiratory:  Negative for cough and chest tightness.   Gastrointestinal:  Negative for abdominal pain and nausea.  Genitourinary:  Negative for difficulty urinating, frequency and vaginal pain.  Musculoskeletal:  Negative for back pain and gait problem.  Skin:  Negative for pallor and rash.  Neurological:  Negative for dizziness, tremors, weakness, numbness and headaches.  Psychiatric/Behavioral:  Positive for sleep disturbance. Negative for confusion, decreased concentration and suicidal ideas. The patient is nervous/anxious.     Objective:  BP 118/78 (BP Location: Left Arm)   Pulse 93   Temp 98 F (36.7 C) (Oral)   Ht '5\' 1"'$  (1.549 m)   Wt 170 lb 6.4 oz (77.3 kg)   LMP 11/24/1982   SpO2 97%   BMI 32.20 kg/m  BP Readings from Last 3 Encounters:  06/19/22 118/78  12/14/21 136/78  09/13/21 130/82    Wt Readings from Last 3 Encounters:  06/19/22 170 lb 6.4 oz (77.3 kg)  12/14/21 167 lb 8 oz (76 kg)  09/13/21 162 lb (73.5 kg)    Physical Exam Constitutional:      General: She is not in acute distress.    Appearance: She is well-developed. She is obese.  HENT:     Head: Normocephalic.     Right Ear: External ear normal.     Left Ear: External ear normal.     Nose: Nose normal.  Eyes:     General:        Right eye: No discharge.        Left eye: No discharge.     Conjunctiva/sclera: Conjunctivae normal.     Pupils: Pupils are equal, round, and reactive to light.  Neck:     Thyroid: No thyromegaly.     Vascular: No JVD.     Trachea: No tracheal deviation.   Cardiovascular:     Rate and Rhythm: Normal rate and regular rhythm.     Heart sounds: Normal heart sounds.  Pulmonary:     Effort: No respiratory distress.     Breath sounds: No stridor. No wheezing.  Abdominal:     General: Bowel sounds are normal. There is no distension.     Palpations: Abdomen is soft. There is no mass.     Tenderness: There is no abdominal tenderness. There is no guarding or rebound.  Musculoskeletal:        General: No tenderness.     Cervical back: Normal range of motion and neck supple. No rigidity.  Lymphadenopathy:     Cervical: No cervical adenopathy.  Skin:    Findings: No erythema or rash.  Neurological:     Cranial Nerves: No cranial nerve deficit.     Motor: No abnormal muscle tone.     Coordination: Coordination normal.     Deep Tendon Reflexes: Reflexes normal.  Psychiatric:        Behavior: Behavior normal.        Thought Content: Thought content normal.        Judgment: Judgment normal.   B knee scars  Lab Results  Component Value Date   WBC 6.0 12/14/2021   HGB 12.1 12/14/2021   HCT 37.0 12/14/2021   PLT 266.0 12/14/2021   GLUCOSE 88 12/14/2021   CHOL 147 12/14/2021   TRIG 85.0 12/14/2021   HDL 84.90 12/14/2021   LDLCALC 45 12/14/2021   ALT 17 12/14/2021   AST 19 12/14/2021   NA 137 12/14/2021   K 4.0 12/14/2021   CL 103 12/14/2021   CREATININE 0.58 12/14/2021   BUN 11 12/14/2021   CO2 29 12/14/2021   TSH 2.25 06/27/2021   INR 1.0 06/27/2021   HGBA1C 5.9 06/27/2021    MM 3D SCREEN BREAST BILATERAL  Result Date: 06/08/2022 CLINICAL DATA:  Screening. EXAM: DIGITAL SCREENING BILATERAL MAMMOGRAM WITH TOMOSYNTHESIS AND CAD TECHNIQUE: Bilateral screening digital craniocaudal and mediolateral oblique mammograms were obtained. Bilateral screening digital breast tomosynthesis was performed. The images were evaluated with computer-aided detection. COMPARISON:  Previous exam(s). ACR Breast Density Category b: There are scattered areas  of fibroglandular density. FINDINGS: There are no findings suspicious for malignancy. IMPRESSION: No mammographic evidence of malignancy. A result letter of this screening mammogram will be mailed directly to the patient. RECOMMENDATION: Screening mammogram in one year. (Code:SM-B-01Y) BI-RADS  CATEGORY  1: Negative. Electronically Signed   By: Ammie Ferrier M.D.   On: 06/08/2022 09:38    Assessment & Plan:   Problem List Items Addressed This Visit     Anxiety attack    Cymbalta anxiety attacks w/SOB - seldom Lorazepam prn  Potential benefits of a long term benzodiazepines  use as well as potential risks  and complications were explained to the patient and were aknowledged. Reduce caffeine      Relevant Medications   LORazepam (ATIVAN) 1 MG tablet   Essential hypertension    Cont on Amlodipine, Losartan      Insomnia    Chronic Lorazepam prn  Potential benefits of a long term benzodiazepines  use as well as potential risks  and complications were explained to the patient and were aknowledged.      Osteoarthritis    S/p B TKR by Dr Theda Sers         Meds ordered this encounter  Medications   LORazepam (ATIVAN) 1 MG tablet    Sig: TAKE 1/2 TO 1 TABLET BY MOUTH TWO TIMES A DAY AS NEEDED FOR ANXIETY    Dispense:  30 tablet    Refill:  3      Follow-up: Return in about 6 months (around 12/18/2022) for a follow-up visit.  Walker Kehr, MD

## 2022-06-19 NOTE — Progress Notes (Cosign Needed Addendum)
Subjective:   Jennifer Davenport is a 71 y.o. female who presents for Medicare Annual (Subsequent) preventive examination.  I connected with Jennifer Davenport today by telephone and verified that I am speaking with the correct person using two identifiers. I discussed the limitations, risks, security and privacy concerns of performing an evaluation and management service by telephone and the availability of in person appointments. I also discussed with the patient that there may be a patient responsible charge related to this service. The patient expressed understanding and agreed to proceed. Location patient: home Location provider: [place] Persons participating in the visit: Jennifer Davenport and Jennifer Davenport, [place].  Time Spent with patient on telephone encounter: 10 mins  Review of Systems    No ROS. Medicare Wellness Telephone Visit. Additional risk factors are reflected in social history. Cardiac Risk Factors include: advanced age (>33mn, >>90women)     Objective:    There were no vitals filed for this visit. There is no height or weight on file to calculate BMI.     06/19/2022    2:20 PM 04/29/2021   10:10 AM 04/21/2021    3:25 PM 10/06/2018    2:03 AM 07/19/2017    9:55 AM  Advanced Directives  Does Patient Have a Medical Advance Directive? Yes Yes Yes Yes No  Type of Advance Directive Living will [place] will Living will;Healthcare Power of [place]   Does patient want to make changes to medical advance directive? No - Patient declined No - Patient declined No - Patient declined    Copy of [place]?  No - copy requested No - copy requested    Would patient like information on creating a medical advance directive?     No - Patient declined    Current Medications (verified) Outpatient Encounter Medications as of 06/19/2022  Medication Sig   acetaminophen (TYLENOL) 500 MG tablet Take 500  mg by mouth every 6 (six) hours as needed.   amLODipine (NORVASC) 5 MG tablet TAKE ONE TABLET BY MOUTH DAILY   aspirin 81 MG EC tablet Take 1 by mouth daily   atorvastatin (LIPITOR) 80 MG tablet TAKE ONE TABLET BY MOUTH DAILY AT 6:00PM   Azelaic Acid 15 % cream Apply topically 1 day or 1 dose. After skin is thoroughly washed and patted dry, gently but thoroughly massage a thin film of azelaic acid cream into the affected area twice daily, in the morning and evening.   b complex vitamins tablet Take 1 tablet by mouth daily.   Cholecalciferol (VITAMIN D) 50 MCG (2000 UT) tablet    diclofenac Sodium (VOLTAREN) 1 % GEL Apply 1 application topically 4 (four) times daily.   dicyclomine (BENTYL) 10 MG capsule TAKE ONE CAPSULE BY MOUTH EVERY 6 TO 8 HOURS AS NEEDED FOR CRAMPING AND SPASMS   DULoxetine (CYMBALTA) 20 MG capsule TAKE ONE CAPSULE BY MOUTH DAILY   fluticasone (FLONASE) 50 MCG/ACT nasal spray Place 1-2 sprays into both nostrils daily.    furosemide (LASIX) 20 MG tablet TAKE 1/2 TO 1 TABLET BY MOUTH DAILY AS NEEDED FOR EDEMA   guaiFENesin (MUCINEX) 600 MG 12 hr tablet Take 2 tablets (1,200 mg total) by mouth 2 (two) times daily as needed.   hydroxychloroquine (PLAQUENIL) 200 MG tablet Take 200 mg by mouth daily. Take 1 tablet a day   Loratadine 10 MG CAPS Take 1 by mouth daily as needed   LORazepam (ATIVAN) 1  MG tablet TAKE 1/2 TO 1 TABLET BY MOUTH TWO TIMES A DAY AS NEEDED FOR ANXIETY   losartan (COZAAR) 100 MG tablet TAKE ONE TABLET BY MOUTH DAILY   omeprazole (PRILOSEC) 10 MG capsule    triamcinolone cream (KENALOG) 0.1 %    No facility-administered encounter medications on file as of 06/19/2022.    Allergies (verified) Ceftriaxone sodium, Levaquin [levofloxacin], Rocephin [ceftriaxone], Biaxin [clarithromycin], Sulfonamide derivatives, Aspirin, Benadryl [diphenhydramine], Ciprofloxacin, Codeine, Dairy aid [tilactase], Gluten meal, Levaquin [levofloxacin in d5w], Paxil [paroxetine hcl],  Pneumovax 23 [pneumococcal vac polyvalent], Rocephin [ceftriaxone sodium in dextrose], and Simvastatin   History: Past Medical History:  Diagnosis Date   Abnormal Pap smear of cervix    Allergy    Atrophic vaginitis    Bulging disc 10/2012   CERVICAL   Bursitis of hip    Cataract    mild starting of    Endometriosis    GERD (gastroesophageal reflux disease)    Hyperlipidemia    Hypertension    IBS (irritable bowel syndrome)    Past Surgical History:  Procedure Laterality Date   ABDOMINAL HYSTERECTOMY  11/1982   AUB & endometriosis, ovaries remain   APPENDECTOMY  1/72   BREAST LUMPECTOMY WITH RADIOACTIVE SEED LOCALIZATION Right 04/29/2021   Procedure: RIGHT BREAST LUMPECTOMY WITH RADIOACTIVE SEED LOCALIZATION;  Surgeon: Jovita Kussmaul, MD;  Location: [place];  Service: General;  Laterality: Right;   BUNIONECTOMY Left 04/2004   left foot   CATARACT EXTRACTION     CERVIX LESION DESTRUCTION     COLONOSCOPY     CRYOTHERAPY     for abnormal pap smear   eyelid surgery Bilateral 2018   HYSTEROTOMY  5/72   fetal demise, also BTL   LAPAROSCOPIC CHOLECYSTECTOMY  02/2005   POLYPECTOMY     TMJ ARTHROPLASTY  6/94   secondary to hockey injury 15 years earlier   TUBAL LIGATION Bilateral 5/72   UPPER GASTROINTESTINAL ENDOSCOPY     Family History  Problem Relation Age of Onset   Diverticulitis Mother    Breast cancer Mother 54   Hypertension Mother    Heart disease Mother    Osteoarthritis Mother    CVA Mother    Hypercholesterolemia Mother    Colon polyps Mother    Heart disease Brother    Hypertension Brother    Lung cancer Brother 34       lung cancer, MI, ETOH   Heart disease Sister    Hypertension Sister    Osteoarthritis Sister    Hypertension Father    Hypertension Maternal Uncle    Heart disease Maternal Uncle    Hypertension Maternal Grandfather    Heart disease Maternal Grandfather    Colon cancer Neg Hx    Rectal cancer Neg Hx    Stomach cancer  Neg Hx    Social History   Socioeconomic History   Marital status: Married    Spouse name: Not on file   Number of children: Not on file   Years of education: Not on file   Highest education level: Not on file  Occupational History   Not on file  Tobacco Use   Smoking status: Never   Smokeless tobacco: Never  Vaping Use   Vaping Use: Never used  Substance and Sexual Activity   Alcohol use: Not Currently   Drug use: No   Sexual activity: Yes    Birth control/protection: Surgical    Comment: hysterectomy   Other Topics Concern  Not on file  Social History Narrative   Lives with husband at home.  Retired.  Education BS degree.  2-3 cups coffee daily.     Social Determinants of Health   Financial Resource Strain: Low Risk  (06/19/2022)   Overall Financial Resource Strain (CARDIA)    Difficulty of Paying Living Expenses: Not hard at all  Food Insecurity: No Food Insecurity (06/19/2022)   Hunger Vital Sign    Worried About Running Out of Food in the Last Year: Never true    Ran Out of Food in the Last Year: Never true  Transportation Needs: No Transportation Needs (06/19/2022)   PRAPARE - Hydrologist (Medical): No    Lack of Transportation (Non-Medical): No  Physical Activity: Sufficiently Active (06/19/2022)   Exercise Vital Sign    Days of Exercise per Week: 6 days    Minutes of Exercise per Session: 30 min  Stress: No Stress Concern Present (06/19/2022)   [place]    Feeling of Stress : Only a little  Social Connections: Socially Integrated (06/19/2022)   Social Connection and Isolation Panel [NHANES]    Frequency of Communication with Friends and Family: More than three times a week    Frequency of Social Gatherings with Friends and Family: Once a week    Attends Religious Services: More than 4 times per year    Active Member of [employer] or Organizations: Yes    Attends Programme researcher, broadcasting/film/video: More than 4 times per year    Marital Status: Married    Tobacco Counseling Counseling given: Not Answered   Clinical Intake:  Pre-visit preparation completed: Yes  Pain : No/denies pain     Nutritional Risks: None Diabetes: No  How often do you need to have someone help you when you read instructions, pamphlets, or other written materials from your doctor or pharmacy?: 1 - Never What is the last grade level you completed in school?: 2 college degrees  Diabetic?no  Interpreter Needed?: No  Information entered by :: Jennifer Davenport, [place]   Activities of Daily Living    06/19/2022    2:20 PM 06/19/2022    9:51 AM  In your present state of health, do you have any difficulty performing the following activities:  Hearing? 0 0  Vision? 0 0  Difficulty concentrating or making decisions? 0 0  Walking or climbing stairs? 0 0  Dressing or bathing? 0 0  Doing errands, shopping? 0 0  Preparing Food and eating ? N N  Using the Toilet? N N  In the past six months, have you accidently leaked urine? N N  Do you have problems with loss of bowel control? N N  Managing your Medications? N N  Managing your Finances? N N  Housekeeping or managing your Housekeeping? N N    Patient Care Team: Plotnikov, Evie Lacks, MD as PCP - General (Internal Medicine) Troy Sine, MD as PCP - Cardiology (Cardiology) Gavin Pound, MD as Consulting Physician (Rheumatology) Sydnee Cabal, MD as Consulting Physician (Orthopedic Surgery)  Indicate any recent Medical Services you may have received from other than [facility] providers in the past year (date may be approximate).     Assessment:   This is a routine wellness examination for Veryl.  Hearing/Vision screen Patient denied any hearing difficulty. No hearing aids. Patient does not wear any corrective lenses/contacts.  Dietary issues and exercise activities discussed: Current Exercise Habits: Home  exercise  routine, Type of exercise: walking, Time (Minutes): 30, Frequency (Times/Week): 6, Weekly Exercise (Minutes/Week): 180, Intensity: Mild, Exercise limited by: None identified   Goals Addressed               This Visit's Progress     Patient Stated (pt-stated)        Patient states she would like to get back into hiking.        Depression Screen    06/19/2022    2:19 PM 12/14/2021    8:53 AM 03/14/2021    8:52 AM 03/06/2019    9:38 AM 03/04/2018    3:59 PM 02/20/2017    8:28 AM  PHQ 2/9 Scores  PHQ - 2 Score 0 0 0 0 0 0  PHQ- 9 Score   0       Fall Risk    06/19/2022    2:20 PM 06/19/2022    9:51 AM 06/18/2022    3:12 PM 12/14/2021    8:53 AM 03/14/2021    8:53 AM  Fall Risk   Falls in the past year? 0   0 0  Number falls in past yr: 0 0 0  0  Injury with Fall? 0    0  Risk for fall due to : No Fall Risks    No Fall Risks  Follow up Falls evaluation completed        FALL RISK PREVENTION PERTAINING TO THE HOME:  Any stairs in or around the home? No  If so, are there any without handrails?  N/A Home free of loose throw rugs in walkways, pet beds, electrical cords, etc? Yes  Adequate lighting in your home to reduce risk of falls? Yes   ASSISTIVE DEVICES UTILIZED TO PREVENT FALLS:  Life alert? No  Use of a cane, walker or w/c? No  Grab bars in the bathroom? Yes  Shower chair or bench in shower? Yes  Elevated toilet seat or a handicapped toilet? No   TIMED UP AND GO:  Was the test performed? No phone visit.  Length of time to ambulate 10 feet: N/A sec.   Patient stated that she has no issues with gait or balance; does not use any assistive devices.  Cognitive Function:  Patient is cogitatively intact.      06/19/2022    2:21 PM  6CIT Screen  What Year? 0 points  What month? 0 points  What time? 0 points  Count back from 20 0 points  Months in reverse 0 points  Repeat phrase 0 points  Total Score 0 points    Immunizations Immunization History   Administered Date(s) Administered   DTaP 09/25/2005   Fluad Quad(high Dose 65+) 07/14/2019, 07/10/2020, 06/29/2021   Influenza, High Dose Seasonal PF 07/31/2017, 08/02/2018   Influenza,inj,Quad PF,6+ Mos 07/26/2016   PFIZER Comirnaty(Gray Top)Covid-19 Tri-Sucrose Vaccine 01/14/2021   PFIZER(Purple Top)SARS-COV-2 Vaccination 10/15/2019, 11/05/2019, 06/27/2020   Pfizer Covid-19 Vaccine Bivalent Booster 63yr & up 06/15/2021   Pneumococcal Conjugate-13 11/02/2017   Pneumococcal Polysaccharide-23 03/07/2019   Td 09/25/2012    TDAP status: Up to date  Flu Vaccine status: Due, Education has been provided regarding the importance of this vaccine. Advised may receive this vaccine at local pharmacy or Health Dept. Aware to provide a copy of the vaccination record if obtained from local pharmacy or Health Dept. Verbalized acceptance and understanding.  Pneumococcal vaccine status: Up to date  Covid-19 vaccine status: Completed vaccines  Qualifies for Shingles Vaccine? Yes  Zostavax completed No   Shingrix Completed?: No.    Education has been provided regarding the importance of this vaccine. Patient has been advised to call insurance company to determine out of pocket expense if they have not yet received this vaccine. Advised may also receive vaccine at local pharmacy or Health Dept. Verbalized acceptance and understanding.  Screening Tests Health Maintenance  Topic Date Due   FOOT EXAM  Never done   Diabetic kidney evaluation - Urine ACR  Never done   OPHTHALMOLOGY EXAM  05/26/2017   HEMOGLOBIN A1C  12/26/2021   COVID-19 Vaccine (6 - Pfizer risk series) 07/05/2022 (Originally 08/10/2021)   INFLUENZA VACCINE  07/19/2022 (Originally 04/25/2022)   Zoster Vaccines- Shingrix (1 of 2) 09/18/2022 (Originally 06/13/1970)   TETANUS/TDAP  09/25/2022   Diabetic kidney evaluation - GFR measurement  12/15/2022   COLONOSCOPY (Pts 45-1yr Insurance coverage will need to be confirmed)  05/16/2023    MAMMOGRAM  06/06/2024   Pneumonia Vaccine 71 Years old  Completed   DEXA SCAN  Completed   Hepatitis C Screening  Completed   HPV VACCINES  Aged Out    Health Maintenance  Health Maintenance Due  Topic Date Due   FOOT EXAM  Never done   Diabetic kidney evaluation - Urine ACR  Never done   OPHTHALMOLOGY EXAM  05/26/2017   HEMOGLOBIN A1C  12/26/2021    Colorectal cancer screening: Type of screening: Colonoscopy. Completed 05/15/2018. Repeat every 5 years  Mammogram status: Completed 06/06/22. Repeat every year  Bone Density status: Completed 03/11/2013. Results reflect: Bone density results: NORMAL. Repeat every N/A years.  Lung Cancer Screening: (Low Dose CT Chest recommended if Age 70-80years, 30 pack-year currently smoking OR have quit w/in 15years.) does not qualify.   Lung Cancer Screening Referral: N/A  Additional Screening:  Hepatitis C Screening: does qualify; Completed 02/15/2016  Vision Screening: Recommended annual ophthalmology exams for early detection of glaucoma and other disorders of the eye. Is the patient up to date with their annual eye exam?  Yes  Who is the provider or what is the name of the office in which the patient attends annual eye exams[facility] If pt is not established with a provider, would they like to be referred to a provider to establish care? No .   Dental Screening: Recommended annual dental exams for proper oral hygiene  Community Resource Referral / Chronic Care Management: CRR required this visit?  No   CCM required this visit?  No      Plan:     I have personally reviewed and noted the following in the patient's chart:   Medical and social history Use of alcohol, tobacco or illicit drugs  Current medications and supplements including opioid prescriptions. Patient is not currently taking opioid prescriptions. Functional ability and status Nutritional status Physical activity Advanced directives List of other  physicians Hospitalizations, surgeries, and ER visits in previous 12 months Vitals Screenings to include cognitive, depression, and falls Referrals and appointments  In addition, I have reviewed and discussed with patient certain preventive protocols, quality metrics, and best practice recommendations. A written personalized care plan for preventive services as well as general preventive health recommendations were provided to patient.     HRossie Muskrat CAnnawan  06/19/2022   Nurse Notes: N/A  Medical screening examination/treatment/procedure(s) were performed by non-physician practitioner and as supervising physician I was immediately available for consultation/collaboration.  I agree with above. ALew Dawes MD

## 2022-06-20 ENCOUNTER — Encounter: Payer: Self-pay | Admitting: Internal Medicine

## 2022-06-20 DIAGNOSIS — M7061 Trochanteric bursitis, right hip: Secondary | ICD-10-CM | POA: Insufficient documentation

## 2022-06-20 DIAGNOSIS — M1611 Unilateral primary osteoarthritis, right hip: Secondary | ICD-10-CM | POA: Diagnosis not present

## 2022-06-21 ENCOUNTER — Other Ambulatory Visit (HOSPITAL_BASED_OUTPATIENT_CLINIC_OR_DEPARTMENT_OTHER): Payer: Self-pay

## 2022-06-21 MED ORDER — INFLUENZA VAC A&B SA ADJ QUAD 0.5 ML IM PRSY
PREFILLED_SYRINGE | INTRAMUSCULAR | 0 refills | Status: DC
  Filled 2022-06-21: qty 0.5, 1d supply, fill #0

## 2022-07-07 ENCOUNTER — Other Ambulatory Visit (HOSPITAL_BASED_OUTPATIENT_CLINIC_OR_DEPARTMENT_OTHER): Payer: Self-pay

## 2022-07-07 DIAGNOSIS — Z23 Encounter for immunization: Secondary | ICD-10-CM | POA: Diagnosis not present

## 2022-07-07 MED ORDER — COVID-19 MRNA 2023-2024 VACCINE (COMIRNATY) 0.3 ML INJECTION
INTRAMUSCULAR | 0 refills | Status: DC
Start: 2022-07-07 — End: 2023-03-20
  Filled 2022-07-07: qty 0.3, 1d supply, fill #0

## 2022-07-18 ENCOUNTER — Other Ambulatory Visit (HOSPITAL_BASED_OUTPATIENT_CLINIC_OR_DEPARTMENT_OTHER): Payer: Self-pay

## 2022-07-20 ENCOUNTER — Other Ambulatory Visit (HOSPITAL_BASED_OUTPATIENT_CLINIC_OR_DEPARTMENT_OTHER): Payer: Self-pay

## 2022-07-20 DIAGNOSIS — Z23 Encounter for immunization: Secondary | ICD-10-CM | POA: Diagnosis not present

## 2022-07-20 MED ORDER — INFLUENZA VAC A&B SA ADJ QUAD 0.5 ML IM PRSY
PREFILLED_SYRINGE | INTRAMUSCULAR | 0 refills | Status: DC
  Filled 2022-07-20: qty 0.5, 1d supply, fill #0

## 2022-08-04 ENCOUNTER — Other Ambulatory Visit (HOSPITAL_BASED_OUTPATIENT_CLINIC_OR_DEPARTMENT_OTHER): Payer: Self-pay

## 2022-08-05 ENCOUNTER — Other Ambulatory Visit (HOSPITAL_BASED_OUTPATIENT_CLINIC_OR_DEPARTMENT_OTHER): Payer: Self-pay

## 2022-08-15 ENCOUNTER — Other Ambulatory Visit (HOSPITAL_BASED_OUTPATIENT_CLINIC_OR_DEPARTMENT_OTHER): Payer: Self-pay

## 2022-08-15 MED ORDER — AREXVY 120 MCG/0.5ML IM SUSR
INTRAMUSCULAR | 0 refills | Status: DC
  Filled 2022-08-15: qty 0.5, 1d supply, fill #0

## 2022-08-24 DIAGNOSIS — Z4789 Encounter for other orthopedic aftercare: Secondary | ICD-10-CM | POA: Diagnosis not present

## 2022-10-15 ENCOUNTER — Encounter: Payer: Self-pay | Admitting: Internal Medicine

## 2022-10-16 ENCOUNTER — Telehealth: Payer: Medicare HMO | Admitting: Nurse Practitioner

## 2022-10-16 DIAGNOSIS — U071 COVID-19: Secondary | ICD-10-CM

## 2022-10-16 MED ORDER — MOLNUPIRAVIR EUA 200MG CAPSULE: 4.0000 | ORAL_CAPSULE | Freq: Two times a day (BID) | ORAL | 0 refills | Status: AC

## 2022-10-16 MED ORDER — BENZONATATE 100 MG PO CAPS: 100.0000 mg | ORAL_CAPSULE | Freq: Three times a day (TID) | ORAL | 0 refills | Status: DC | PRN

## 2022-10-16 NOTE — Progress Notes (Signed)
Virtual Visit Consent   Jennifer Davenport, you are scheduled for a virtual visit with a [facility] provider today. Just as with appointments in the office, your consent must be obtained to participate. Your consent will be active for this visit and any virtual visit you may have with one of our providers in the next 365 days. If you have a MyChart account, a copy of this consent can be sent to you electronically.  As this is a virtual visit, video technology does not allow for your provider to perform a traditional examination. This may limit your provider's ability to fully assess your condition. If your provider identifies any concerns that need to be evaluated in person or the need to arrange testing (such as labs, EKG, etc.), we will make arrangements to do so. Although advances in technology are sophisticated, we cannot ensure that it will always work on either your end or our end. If the connection with a video visit is poor, the visit may have to be switched to a telephone visit. With either a video or telephone visit, we are not always able to ensure that we have a secure connection.  By engaging in this virtual visit, you consent to the provision of healthcare and authorize for your insurance to be billed (if applicable) for the services provided during this visit. Depending on your insurance coverage, you may receive a charge related to this service.  I need to obtain your verbal consent now. Are you willing to proceed with your visit today? [name] has provided verbal consent on 10/16/2022 for a virtual visit (video or telephone). Apolonio Schneiders, FNP  Date: 10/16/2022 10:17 AM  Virtual Visit via Video Note   I, Apolonio Schneiders, connected with  Kirk Sampley  ([id number], 1951/07/26) on 10/16/22 at 10:15 AM EST by a video-enabled telemedicine application and verified that I am speaking with the correct person using two identifiers.  Location: Patient: Virtual Visit  Location Patient: Home Provider: Virtual Visit Location Provider: Home Office   I discussed the limitations of evaluation and management by telemedicine and the availability of in person appointments. The patient expressed understanding and agreed to proceed.    History of Present Illness: Jennifer Davenport is a 72 y.o. who identifies as a female who was assigned female at birth, and is being seen today for COVID-19.  She took a test yesterday at home and was positive.  Symptom onset was 10/14/21  Symptoms include: Chills, headache, cough, lethargy, minimal appetite   She has not had COVID in the past  She has been vaccinated for COVID including the booster in 05/2022  She has a history of HTN  Has had pneumonia in the past  Has had ear surgeries and tubes in the past   Denies a history of kidney disease   Problems:  Patient Active Problem List   Diagnosis Date Noted   Insomnia 06/19/2022   Chills 08/23/2021   Pain and swelling of knee, left 08/11/2021   Rigors 08/11/2021   Deficiency anemia 08/11/2021   Postoperative wound infection 08/11/2021   Infection of skin and subcutaneous tissue 08/11/2021   Preop exam for internal medicine 06/14/2021   Knee pain, left 03/14/2021   Breast mass, right 03/14/2021   Cough 09/09/2020   Trigger finger 06/02/2020   Daytime somnolence 01/31/2020   Hand pain, right 12/09/2019   Anxiety attack 10/08/2018   Neck pain 10/08/2018   Syncope 04/03/2018   CAD (coronary artery disease) 12/06/2017  Near syncope 08/03/2017   Gluten intolerance 08/03/2017   Droopy eyelid, bilateral 08/03/2017   Actinic keratosis 08/22/2016   Carotid bruit 08/17/2015   Well adult exam 02/11/2015   Benign paroxysmal positional vertigo 04/06/2014   NUMBNESS, HAND 03/06/2007   Dyslipidemia 09/28/2006   Essential hypertension 09/28/2006   Allergic rhinitis 09/28/2006   DISORDER, 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   GERD 09/28/2006   Irritable bowel syndrome 09/28/2006    Osteoarthritis 09/28/2006   Arthropathy 09/28/2006   LOW BACK PAIN 09/28/2006    Allergies:  Allergies  Allergen Reactions   Ceftriaxone Sodium     Anaphylaxis shock Can take Amoxicillin ok   Levaquin [Levofloxacin] Anaphylaxis   Rocephin [Ceftriaxone] Anaphylaxis   Biaxin [Clarithromycin]     Chest pain   Sulfonamide Derivatives Nausea Only   Aspirin     Cramps and diarrhea   Benadryl [Diphenhydramine]     Not tolerating well   Ciprofloxacin     myalgias Other reaction(s): Unknown   Codeine     nausea   Dairy Aid [Tilactase]    Gluten Meal    Levaquin [Levofloxacin In D5w]     hives   Paxil [Paroxetine Hcl]     Headache   Pneumovax 23 [Pneumococcal Vac Polyvalent]     Rash    Rocephin [Ceftriaxone Sodium In Dextrose]    Simvastatin     Leg pains   Medications:  Current Outpatient Medications:    acetaminophen (TYLENOL) 500 MG tablet, Take 500 mg by mouth every 6 (six) hours as needed., Disp: , Rfl:    amLODipine (NORVASC) 5 MG tablet, TAKE ONE TABLET BY MOUTH DAILY, Disp: 90 tablet, Rfl: 3   aspirin 81 MG EC tablet, Take 1 by mouth daily, Disp: , Rfl:    atorvastatin (LIPITOR) 80 MG tablet, TAKE ONE TABLET BY MOUTH DAILY AT 6:00PM, Disp: 90 tablet, Rfl: 3   Azelaic Acid 15 % cream, Apply topically 1 day or 1 dose. After skin is thoroughly washed and patted dry, gently but thoroughly massage a thin film of azelaic acid cream into the affected area twice daily, in the morning and evening., Disp: , Rfl:    b complex vitamins tablet, Take 1 tablet by mouth daily., Disp: , Rfl:    Cholecalciferol (VITAMIN D) 50 MCG (2000 UT) tablet, , Disp: , Rfl:    COVID-19 mRNA vaccine 2023-2024 (COMIRNATY) SUSP injection, Inject into the muscle., Disp: 0.3 mL, Rfl: 0   diclofenac Sodium (VOLTAREN) 1 % GEL, Apply 1 application topically 4 (four) times daily., Disp: 100 g, Rfl: 3   dicyclomine (BENTYL) 10 MG capsule, TAKE ONE CAPSULE BY MOUTH EVERY 6 TO 8 HOURS AS NEEDED FOR CRAMPING AND  SPASMS, Disp: 90 capsule, Rfl: 1   DULoxetine (CYMBALTA) 20 MG capsule, TAKE ONE CAPSULE BY MOUTH DAILY, Disp: 90 capsule, Rfl: 3   fluticasone (FLONASE) 50 MCG/ACT nasal spray, Place 1-2 sprays into both nostrils daily. , Disp: , Rfl:    furosemide (LASIX) 20 MG tablet, TAKE 1/2 TO 1 TABLET BY MOUTH DAILY AS NEEDED FOR EDEMA, Disp: 90 tablet, Rfl: 0   guaiFENesin (MUCINEX) 600 MG 12 hr tablet, Take 2 tablets (1,200 mg total) by mouth 2 (two) times daily as needed., Disp: 60 tablet, Rfl: 0   hydroxychloroquine (PLAQUENIL) 200 MG tablet, Take 200 mg by mouth daily. Take 1 tablet a day, Disp: , Rfl:    influenza vaccine adjuvanted (FLUAD) 0.5 ML injection, Inject into the muscle., Disp: 0.5 mL, Rfl: 0  influenza vaccine adjuvanted (FLUAD) 0.5 ML injection, Inject into the muscle., Disp: 0.5 mL, Rfl: 0   Loratadine 10 MG CAPS, Take 1 by mouth daily as needed, Disp: , Rfl:    LORazepam (ATIVAN) 1 MG tablet, TAKE 1/2 TO 1 TABLET BY MOUTH TWO TIMES A DAY AS NEEDED FOR ANXIETY, Disp: 30 tablet, Rfl: 3   losartan (COZAAR) 100 MG tablet, TAKE ONE TABLET BY MOUTH DAILY, Disp: 90 tablet, Rfl: 1   omeprazole (PRILOSEC) 10 MG capsule, , Disp: , Rfl:    RSV vaccine recomb adjuvanted (AREXVY) 120 MCG/0.5ML injection, Inject into the muscle., Disp: 0.5 mL, Rfl: 0   triamcinolone cream (KENALOG) 0.1 %, , Disp: , Rfl:   Observations/Objective: Patient is well-developed, well-nourished in no acute distress.  Resting comfortably  at home.  Head is normocephalic, atraumatic.  No labored breathing.  Speech is clear and coherent with logical content.  Patient is alert and oriented at baseline.    Assessment and Plan: 1. COVID-19  - molnupiravir EUA (LAGEVRIO) 200 mg CAPS capsule; Take 4 capsules (800 mg total) by mouth 2 (two) times daily for 5 days.  Dispense: 40 capsule; Refill: 0 - benzonatate (TESSALON) 100 MG capsule; Take 1 capsule (100 mg total) by mouth 3 (three) times daily as needed.  Dispense: 30  capsule; Refill: 0    Continue to treat symptoms with over the counter medications as discussed  Coricidin HBP   Follow Up Instructions: I discussed the assessment and treatment plan with the patient. The patient was provided an opportunity to ask questions and all were answered. The patient agreed with the plan and demonstrated an understanding of the instructions.  A copy of instructions were sent to the patient via MyChart unless otherwise noted below.    The patient was advised to call back or seek an in-person evaluation if the symptoms worsen or if the condition fails to improve as anticipated.  Time:  I spent 20 minutes with the patient via telehealth technology discussing the above problems/concerns.    Apolonio Schneiders, FNP

## 2022-10-23 DIAGNOSIS — E669 Obesity, unspecified: Secondary | ICD-10-CM | POA: Diagnosis not present

## 2022-10-23 DIAGNOSIS — M0609 Rheumatoid arthritis without rheumatoid factor, multiple sites: Secondary | ICD-10-CM | POA: Diagnosis not present

## 2022-10-23 DIAGNOSIS — Z6833 Body mass index (BMI) 33.0-33.9, adult: Secondary | ICD-10-CM | POA: Diagnosis not present

## 2022-10-23 DIAGNOSIS — Z79899 Other long term (current) drug therapy: Secondary | ICD-10-CM | POA: Diagnosis not present

## 2022-10-23 DIAGNOSIS — M1991 Primary osteoarthritis, unspecified site: Secondary | ICD-10-CM | POA: Diagnosis not present

## 2022-10-23 DIAGNOSIS — M310 Hypersensitivity angiitis: Secondary | ICD-10-CM | POA: Diagnosis not present

## 2022-11-21 DIAGNOSIS — R7989 Other specified abnormal findings of blood chemistry: Secondary | ICD-10-CM | POA: Diagnosis not present

## 2022-11-23 ENCOUNTER — Other Ambulatory Visit: Payer: Self-pay | Admitting: Internal Medicine

## 2022-12-01 ENCOUNTER — Other Ambulatory Visit (HOSPITAL_COMMUNITY): Payer: Self-pay

## 2022-12-13 DIAGNOSIS — M25571 Pain in right ankle and joints of right foot: Secondary | ICD-10-CM | POA: Insufficient documentation

## 2022-12-13 DIAGNOSIS — Z4789 Encounter for other orthopedic aftercare: Secondary | ICD-10-CM | POA: Diagnosis not present

## 2022-12-18 ENCOUNTER — Encounter: Payer: Self-pay | Admitting: Internal Medicine

## 2022-12-18 ENCOUNTER — Ambulatory Visit (INDEPENDENT_AMBULATORY_CARE_PROVIDER_SITE_OTHER): Payer: Medicare Other | Admitting: Internal Medicine

## 2022-12-18 VITALS — BP 122/76 | HR 73 | Temp 98.0°F | Ht 61.0 in | Wt 173.0 lb

## 2022-12-18 DIAGNOSIS — K9041 Non-celiac gluten sensitivity: Secondary | ICD-10-CM | POA: Diagnosis not present

## 2022-12-18 DIAGNOSIS — E785 Hyperlipidemia, unspecified: Secondary | ICD-10-CM

## 2022-12-18 DIAGNOSIS — M1991 Primary osteoarthritis, unspecified site: Secondary | ICD-10-CM | POA: Insufficient documentation

## 2022-12-18 DIAGNOSIS — K219 Gastro-esophageal reflux disease without esophagitis: Secondary | ICD-10-CM

## 2022-12-18 DIAGNOSIS — F410 Panic disorder [episodic paroxysmal anxiety] without agoraphobia: Secondary | ICD-10-CM

## 2022-12-18 DIAGNOSIS — I25119 Atherosclerotic heart disease of native coronary artery with unspecified angina pectoris: Secondary | ICD-10-CM

## 2022-12-18 DIAGNOSIS — I10 Essential (primary) hypertension: Secondary | ICD-10-CM | POA: Diagnosis not present

## 2022-12-18 DIAGNOSIS — D539 Nutritional anemia, unspecified: Secondary | ICD-10-CM

## 2022-12-18 LAB — CBC WITH DIFFERENTIAL/PLATELET
Basophils Absolute: 0.1 10*3/uL (ref 0.0–0.1)
Basophils Relative: 1.1 % (ref 0.0–3.0)
Eosinophils Absolute: 0.2 10*3/uL (ref 0.0–0.7)
Eosinophils Relative: 3.5 % (ref 0.0–5.0)
HCT: 39.7 % (ref 36.0–46.0)
Hemoglobin: 13.1 g/dL (ref 12.0–15.0)
Lymphocytes Relative: 30.3 % (ref 12.0–46.0)
Lymphs Abs: 1.8 10*3/uL (ref 0.7–4.0)
MCHC: 32.9 g/dL (ref 30.0–36.0)
MCV: 86.5 fl (ref 78.0–100.0)
Monocytes Absolute: 0.5 10*3/uL (ref 0.1–1.0)
Monocytes Relative: 8.2 % (ref 3.0–12.0)
Neutro Abs: 3.3 10*3/uL (ref 1.4–7.7)
Neutrophils Relative %: 56.9 % (ref 43.0–77.0)
Platelets: 288 10*3/uL (ref 150.0–400.0)
RBC: 4.59 Mil/uL (ref 3.87–5.11)
RDW: 14.5 % (ref 11.5–15.5)
WBC: 5.9 10*3/uL (ref 4.0–10.5)

## 2022-12-18 LAB — COMPREHENSIVE METABOLIC PANEL
ALT: 18 U/L (ref 0–35)
AST: 21 U/L (ref 0–37)
Albumin: 4.7 g/dL (ref 3.5–5.2)
Alkaline Phosphatase: 65 U/L (ref 39–117)
BUN: 14 mg/dL (ref 6–23)
CO2: 29 mEq/L (ref 19–32)
Calcium: 10 mg/dL (ref 8.4–10.5)
Chloride: 102 mEq/L (ref 96–112)
Creatinine, Ser: 0.66 mg/dL (ref 0.40–1.20)
GFR: 88.82 mL/min (ref >60.00)
Glucose, Bld: 92 mg/dL (ref 70–99)
Potassium: 4.3 mEq/L (ref 3.5–5.1)
Sodium: 138 mEq/L (ref 135–145)
Total Bilirubin: 0.4 mg/dL (ref 0.2–1.2)
Total Protein: 7.7 g/dL (ref 6.0–8.3)

## 2022-12-18 LAB — URINALYSIS, ROUTINE W REFLEX MICROSCOPIC
Bilirubin Urine: NEGATIVE
Hgb urine dipstick: NEGATIVE
Ketones, ur: NEGATIVE
Nitrite: NEGATIVE
RBC / HPF: NONE SEEN (ref 0–?)
Specific Gravity, Urine: 1.01 (ref 1.000–1.030)
Total Protein, Urine: NEGATIVE
Urine Glucose: NEGATIVE
Urobilinogen, UA: 0.2 (ref 0.0–1.0)
pH: 7 (ref 5.0–8.0)

## 2022-12-18 LAB — LIPID PANEL
Cholesterol: 154 mg/dL (ref 0–200)
HDL: 88.4 mg/dL (ref >39.00)
LDL Cholesterol: 53 mg/dL (ref 0–99)
NonHDL: 65.26
Total CHOL/HDL Ratio: 2
Triglycerides: 61 mg/dL (ref 0.0–149.0)
VLDL: 12.2 mg/dL (ref 0.0–40.0)

## 2022-12-18 MED ORDER — AMLODIPINE BESYLATE 5 MG PO TABS: 5.0000 mg | ORAL_TABLET | Freq: Every day | ORAL | 3 refills | Status: DC

## 2022-12-18 MED ORDER — ATORVASTATIN CALCIUM 80 MG PO TABS: ORAL_TABLET | ORAL | 3 refills | Status: DC

## 2022-12-18 MED ORDER — DULOXETINE HCL 20 MG PO CPEP: 20.0000 mg | ORAL_CAPSULE | Freq: Every day | ORAL | 3 refills | Status: DC

## 2022-12-18 NOTE — Assessment & Plan Note (Signed)
Cont on Amlodipine, Losartan 

## 2022-12-18 NOTE — Progress Notes (Signed)
Subjective:  Patient ID: Phillips Climes, female    DOB: [birth year]  Age: 72 y.o. MRN: [id number]  CC: Follow-up   HPI Halimo Denke presents for HTN, dyslipidemia, depression  Outpatient Medications Prior to Visit  Medication Sig Dispense Refill   acetaminophen (TYLENOL) 500 MG tablet Take 500 mg by mouth every 6 (six) hours as needed.     aspirin 81 MG EC tablet Take 1 by mouth daily     Azelaic Acid 15 % cream Apply topically 1 day or 1 dose. After skin is thoroughly washed and patted dry, gently but thoroughly massage a thin film of azelaic acid cream into the affected area twice daily, in the morning and evening.     b complex vitamins tablet Take 1 tablet by mouth daily.     Cholecalciferol (VITAMIN D) 50 MCG (2000 UT) tablet      COVID-19 mRNA vaccine 2023-2024 (COMIRNATY) SUSP injection Inject into the muscle. 0.3 mL 0   diclofenac Sodium (VOLTAREN) 1 % GEL Apply 1 application topically 4 (four) times daily. 100 g 3   dicyclomine (BENTYL) 10 MG capsule TAKE ONE CAPSULE BY MOUTH EVERY 6 TO 8 HOURS AS NEEDED FOR CRAMPING AND SPASMS 90 capsule 1   fluticasone (FLONASE) 50 MCG/ACT nasal spray Place 1-2 sprays into both nostrils daily.      furosemide (LASIX) 20 MG tablet TAKE 1/2 TO 1 TABLET BY MOUTH DAILY AS NEEDED FOR EDEMA 90 tablet 0   hydroxychloroquine (PLAQUENIL) 200 MG tablet Take 200 mg by mouth daily. Take 1 tablet a day     influenza vaccine adjuvanted (FLUAD) 0.5 ML injection Inject into the muscle. 0.5 mL 0   influenza vaccine adjuvanted (FLUAD) 0.5 ML injection Inject into the muscle. 0.5 mL 0   Loratadine 10 MG CAPS Take 1 by mouth daily as needed     LORazepam (ATIVAN) 1 MG tablet TAKE 1/2 TO 1 TABLET BY MOUTH TWO TIMES A DAY AS NEEDED FOR ANXIETY 30 tablet 3   losartan (COZAAR) 100 MG tablet TAKE 1 TABLET BY MOUTH DAILY 90 tablet 1   omeprazole (PRILOSEC) 10 MG capsule      triamcinolone cream (KENALOG) 0.1 %      amLODipine (NORVASC) 5 MG tablet TAKE  ONE TABLET BY MOUTH DAILY 90 tablet 3   atorvastatin (LIPITOR) 80 MG tablet TAKE ONE TABLET BY MOUTH DAILY AT 6:00PM 90 tablet 3   DULoxetine (CYMBALTA) 20 MG capsule TAKE ONE CAPSULE BY MOUTH DAILY 90 capsule 3   benzonatate (TESSALON) 100 MG capsule Take 1 capsule (100 mg total) by mouth 3 (three) times daily as needed. 30 capsule 0   guaiFENesin (MUCINEX) 600 MG 12 hr tablet Take 2 tablets (1,200 mg total) by mouth 2 (two) times daily as needed. 60 tablet 0   RSV vaccine recomb adjuvanted (AREXVY) 120 MCG/0.5ML injection Inject into the muscle. 0.5 mL 0   No facility-administered medications prior to visit.    ROS: Review of Systems  Constitutional:  Negative for activity change, appetite change, chills, fatigue and unexpected weight change.  HENT:  Negative for congestion, mouth sores and sinus pressure.   Eyes:  Negative for visual disturbance.  Respiratory:  Negative for cough and chest tightness.   Gastrointestinal:  Negative for abdominal pain and nausea.  Genitourinary:  Negative for difficulty urinating, frequency and vaginal pain.  Musculoskeletal:  Negative for back pain and gait problem.  Skin:  Negative for pallor and rash.  Neurological:  Negative  for dizziness, tremors, weakness, numbness and headaches.  Psychiatric/Behavioral:  Negative for confusion and sleep disturbance.     Objective:  BP 122/76 (BP Location: Left Arm, Patient Position: Sitting, Cuff Size: Normal)   Pulse 73   Temp 98 F (36.7 C) (Oral)   Ht 5\' 1"  (1.549 m)   Wt 173 lb (78.5 kg)   LMP 11/24/1982   SpO2 98%   BMI 32.69 kg/m   BP Readings from Last 3 Encounters:  12/18/22 122/76  06/19/22 118/78  12/14/21 136/78    Wt Readings from Last 3 Encounters:  12/18/22 173 lb (78.5 kg)  06/19/22 170 lb 6.4 oz (77.3 kg)  12/14/21 167 lb 8 oz (76 kg)    Physical Exam Constitutional:      General: She is not in acute distress.    Appearance: She is well-developed. She is obese.  HENT:      Head: Normocephalic.     Right Ear: External ear normal.     Left Ear: External ear normal.     Nose: Nose normal.  Eyes:     General:        Right eye: No discharge.        Left eye: No discharge.     Conjunctiva/sclera: Conjunctivae normal.     Pupils: Pupils are equal, round, and reactive to light.  Neck:     Thyroid: No thyromegaly.     Vascular: No JVD.     Trachea: No tracheal deviation.  Cardiovascular:     Rate and Rhythm: Normal rate and regular rhythm.     Heart sounds: Normal heart sounds.  Pulmonary:     Effort: No respiratory distress.     Breath sounds: No stridor. No wheezing.  Abdominal:     General: Bowel sounds are normal. There is no distension.     Palpations: Abdomen is soft. There is no mass.     Tenderness: There is no abdominal tenderness. There is no guarding or rebound.  Musculoskeletal:        General: No tenderness.     Cervical back: Normal range of motion and neck supple. No rigidity.  Lymphadenopathy:     Cervical: No cervical adenopathy.  Skin:    Findings: No erythema or rash.  Neurological:     Cranial Nerves: No cranial nerve deficit.     Motor: No abnormal muscle tone.     Coordination: Coordination normal.     Deep Tendon Reflexes: Reflexes normal.  Psychiatric:        Behavior: Behavior normal.        Thought Content: Thought content normal.        Judgment: Judgment normal.     Lab Results  Component Value Date   WBC 6.0 12/14/2021   HGB 12.1 12/14/2021   HCT 37.0 12/14/2021   PLT 266.0 12/14/2021   GLUCOSE 88 12/14/2021   CHOL 147 12/14/2021   TRIG 85.0 12/14/2021   HDL 84.90 12/14/2021   LDLCALC 45 12/14/2021   ALT 17 12/14/2021   AST 19 12/14/2021   NA 137 12/14/2021   K 4.0 12/14/2021   CL 103 12/14/2021   CREATININE 0.58 12/14/2021   BUN 11 12/14/2021   CO2 29 12/14/2021   TSH 2.25 06/27/2021   INR 1.0 06/27/2021   HGBA1C 5.9 06/27/2021    MM 3D SCREEN BREAST BILATERAL  Result Date: 06/08/2022 CLINICAL  DATA:  Screening. EXAM: DIGITAL SCREENING BILATERAL MAMMOGRAM WITH TOMOSYNTHESIS AND CAD TECHNIQUE: Bilateral screening digital  craniocaudal and mediolateral oblique mammograms were obtained. Bilateral screening digital breast tomosynthesis was performed. The images were evaluated with computer-aided detection. COMPARISON:  Previous exam(s). ACR Breast Density Category b: There are scattered areas of fibroglandular density. FINDINGS: There are no findings suspicious for malignancy. IMPRESSION: No mammographic evidence of malignancy. A result letter of this screening mammogram will be mailed directly to the patient. RECOMMENDATION: Screening mammogram in one year. (Code:SM-B-01Y) BI-RADS CATEGORY  1: Negative. Electronically Signed   By: Ammie Ferrier M.D.   On: 06/08/2022 09:38    Assessment & Plan:   Problem List Items Addressed This Visit       Cardiovascular and Mediastinum   Essential hypertension    Cont on Amlodipine, Losartan      Relevant Medications   amLODipine (NORVASC) 5 MG tablet   atorvastatin (LIPITOR) 80 MG tablet   CAD (coronary artery disease) - Primary    On Lipitor, ASA      Relevant Medications   amLODipine (NORVASC) 5 MG tablet   atorvastatin (LIPITOR) 80 MG tablet   Other Relevant Orders   TSH   Urinalysis   CBC with Differential/Platelet   Lipid panel   Comprehensive metabolic panel     Digestive   GERD    On Nexium        Other   Gluten intolerance    On gluten free, milk free and lectin free diet      Dyslipidemia   Relevant Medications   atorvastatin (LIPITOR) 80 MG tablet   Other Relevant Orders   TSH   Urinalysis   CBC with Differential/Platelet   Lipid panel   Comprehensive metabolic panel   Deficiency anemia    Monitor CBC      Relevant Orders   CBC with Differential/Platelet   Anxiety attack    Lorazepam prn  Potential benefits of a long term benzodiazepines  use as well as potential risks  and complications were explained  to the patient and were aknowledged. On a low dose Cymbalta OK w/a low dose Tramadol - HA w>25 mg dose      Relevant Medications   DULoxetine (CYMBALTA) 20 MG capsule      Meds ordered this encounter  Medications   amLODipine (NORVASC) 5 MG tablet    Sig: Take 1 tablet (5 mg total) by mouth daily.    Dispense:  90 tablet    Refill:  3   atorvastatin (LIPITOR) 80 MG tablet    Sig: TAKE ONE TABLET BY MOUTH DAILY AT 6:00PM    Dispense:  90 tablet    Refill:  3   DULoxetine (CYMBALTA) 20 MG capsule    Sig: Take 1 capsule (20 mg total) by mouth daily.    Dispense:  90 capsule    Refill:  3      Follow-up: Return in about 3 months (around 03/20/2023) for a follow-up visit.  Walker Kehr, MD

## 2022-12-18 NOTE — Assessment & Plan Note (Signed)
Monitor CBC 

## 2022-12-18 NOTE — Patient Instructions (Signed)
Due a tDAP

## 2022-12-18 NOTE — Assessment & Plan Note (Signed)
On gluten free, milk free and lectin free diet 

## 2022-12-18 NOTE — Assessment & Plan Note (Signed)
On Lipitor, ASA 

## 2022-12-18 NOTE — Assessment & Plan Note (Signed)
Lorazepam prn  Potential benefits of a long term benzodiazepines  use as well as potential risks  and complications were explained to the patient and were aknowledged. On a low dose Cymbalta OK w/a low dose Tramadol - HA w>25 mg dose

## 2022-12-18 NOTE — Assessment & Plan Note (Signed)
On Nexium 

## 2022-12-19 LAB — TSH: TSH: 1.7 u[IU]/mL (ref 0.35–5.50)

## 2023-01-01 DIAGNOSIS — L821 Other seborrheic keratosis: Secondary | ICD-10-CM | POA: Diagnosis not present

## 2023-01-01 DIAGNOSIS — L239 Allergic contact dermatitis, unspecified cause: Secondary | ICD-10-CM | POA: Diagnosis not present

## 2023-01-01 DIAGNOSIS — L814 Other melanin hyperpigmentation: Secondary | ICD-10-CM | POA: Diagnosis not present

## 2023-01-01 DIAGNOSIS — L578 Other skin changes due to chronic exposure to nonionizing radiation: Secondary | ICD-10-CM | POA: Diagnosis not present

## 2023-01-01 DIAGNOSIS — D225 Melanocytic nevi of trunk: Secondary | ICD-10-CM | POA: Diagnosis not present

## 2023-01-01 DIAGNOSIS — D485 Neoplasm of uncertain behavior of skin: Secondary | ICD-10-CM | POA: Diagnosis not present

## 2023-01-03 DIAGNOSIS — M7670 Peroneal tendinitis, unspecified leg: Secondary | ICD-10-CM | POA: Insufficient documentation

## 2023-01-03 DIAGNOSIS — M79671 Pain in right foot: Secondary | ICD-10-CM | POA: Diagnosis not present

## 2023-01-03 DIAGNOSIS — M7671 Peroneal tendinitis, right leg: Secondary | ICD-10-CM | POA: Diagnosis not present

## 2023-01-03 DIAGNOSIS — M25571 Pain in right ankle and joints of right foot: Secondary | ICD-10-CM | POA: Diagnosis not present

## 2023-01-15 DIAGNOSIS — H04123 Dry eye syndrome of bilateral lacrimal glands: Secondary | ICD-10-CM | POA: Diagnosis not present

## 2023-01-22 ENCOUNTER — Other Ambulatory Visit (HOSPITAL_BASED_OUTPATIENT_CLINIC_OR_DEPARTMENT_OTHER): Payer: Self-pay

## 2023-01-22 MED ORDER — BOOSTRIX 5-2.5-18.5 LF-MCG/0.5 IM SUSY
0.5000 mL | PREFILLED_SYRINGE | INTRAMUSCULAR | 0 refills | Status: DC
  Filled 2023-01-22: qty 0.5, 1d supply, fill #0

## 2023-01-23 DIAGNOSIS — M7671 Peroneal tendinitis, right leg: Secondary | ICD-10-CM | POA: Diagnosis not present

## 2023-01-23 DIAGNOSIS — M25571 Pain in right ankle and joints of right foot: Secondary | ICD-10-CM | POA: Diagnosis not present

## 2023-01-25 DIAGNOSIS — M25571 Pain in right ankle and joints of right foot: Secondary | ICD-10-CM | POA: Diagnosis not present

## 2023-01-25 DIAGNOSIS — M7671 Peroneal tendinitis, right leg: Secondary | ICD-10-CM | POA: Diagnosis not present

## 2023-01-30 DIAGNOSIS — M7671 Peroneal tendinitis, right leg: Secondary | ICD-10-CM | POA: Diagnosis not present

## 2023-01-30 DIAGNOSIS — M25571 Pain in right ankle and joints of right foot: Secondary | ICD-10-CM | POA: Diagnosis not present

## 2023-02-01 DIAGNOSIS — M25571 Pain in right ankle and joints of right foot: Secondary | ICD-10-CM | POA: Diagnosis not present

## 2023-02-01 DIAGNOSIS — M7671 Peroneal tendinitis, right leg: Secondary | ICD-10-CM | POA: Diagnosis not present

## 2023-02-05 DIAGNOSIS — Z1151 Encounter for screening for human papillomavirus (HPV): Secondary | ICD-10-CM | POA: Diagnosis not present

## 2023-02-05 DIAGNOSIS — Z6834 Body mass index (BMI) 34.0-34.9, adult: Secondary | ICD-10-CM | POA: Diagnosis not present

## 2023-02-05 DIAGNOSIS — Z1272 Encounter for screening for malignant neoplasm of vagina: Secondary | ICD-10-CM | POA: Diagnosis not present

## 2023-02-05 DIAGNOSIS — Z779 Other contact with and (suspected) exposures hazardous to health: Secondary | ICD-10-CM | POA: Diagnosis not present

## 2023-02-05 DIAGNOSIS — N958 Other specified menopausal and perimenopausal disorders: Secondary | ICD-10-CM | POA: Diagnosis not present

## 2023-02-05 DIAGNOSIS — R87616 Satisfactory cervical smear but lacking transformation zone: Secondary | ICD-10-CM | POA: Diagnosis not present

## 2023-02-05 DIAGNOSIS — R29890 Loss of height: Secondary | ICD-10-CM | POA: Diagnosis not present

## 2023-02-06 DIAGNOSIS — M25571 Pain in right ankle and joints of right foot: Secondary | ICD-10-CM | POA: Diagnosis not present

## 2023-02-06 DIAGNOSIS — M7671 Peroneal tendinitis, right leg: Secondary | ICD-10-CM | POA: Diagnosis not present

## 2023-02-09 DIAGNOSIS — M7671 Peroneal tendinitis, right leg: Secondary | ICD-10-CM | POA: Diagnosis not present

## 2023-02-09 DIAGNOSIS — M25571 Pain in right ankle and joints of right foot: Secondary | ICD-10-CM | POA: Diagnosis not present

## 2023-02-26 ENCOUNTER — Encounter: Payer: Self-pay | Admitting: Gastroenterology

## 2023-03-20 ENCOUNTER — Ambulatory Visit (INDEPENDENT_AMBULATORY_CARE_PROVIDER_SITE_OTHER): Payer: Medicare Other | Admitting: Internal Medicine

## 2023-03-20 ENCOUNTER — Encounter: Payer: Self-pay | Admitting: Internal Medicine

## 2023-03-20 VITALS — BP 130/84 | HR 72 | Temp 98.6°F | Ht 61.0 in | Wt 177.0 lb

## 2023-03-20 DIAGNOSIS — I10 Essential (primary) hypertension: Secondary | ICD-10-CM | POA: Diagnosis not present

## 2023-03-20 DIAGNOSIS — E785 Hyperlipidemia, unspecified: Secondary | ICD-10-CM

## 2023-03-20 DIAGNOSIS — F410 Panic disorder [episodic paroxysmal anxiety] without agoraphobia: Secondary | ICD-10-CM | POA: Diagnosis not present

## 2023-03-20 DIAGNOSIS — I25119 Atherosclerotic heart disease of native coronary artery with unspecified angina pectoris: Secondary | ICD-10-CM | POA: Diagnosis not present

## 2023-03-20 MED ORDER — DICYCLOMINE HCL 10 MG PO CAPS: ORAL_CAPSULE | ORAL | 2 refills | Status: AC

## 2023-03-20 MED ORDER — LOSARTAN POTASSIUM 100 MG PO TABS: 100.0000 mg | ORAL_TABLET | Freq: Every day | ORAL | 3 refills | Status: DC

## 2023-03-20 MED ORDER — ATORVASTATIN CALCIUM 80 MG PO TABS: ORAL_TABLET | ORAL | 3 refills | Status: DC

## 2023-03-20 NOTE — Assessment & Plan Note (Signed)
On Lipitor, ASA 

## 2023-03-20 NOTE — Progress Notes (Signed)
Subjective:  Patient ID: Jennifer Davenport, female    DOB: 1951/09/04  Age: 72 y.o. MRN: [id number]  CC: Follow-up (3 MNTH F/U)   HPI Jennifer Davenport presents for HTN, CAD, dyslipidemia  Outpatient Medications Prior to Visit  Medication Sig Dispense Refill   acetaminophen (TYLENOL) 500 MG tablet Take 500 mg by mouth every 6 (six) hours as needed.     amLODipine (NORVASC) 5 MG tablet Take 1 tablet (5 mg total) by mouth daily. 90 tablet 3   aspirin 81 MG EC tablet Take 1 by mouth daily     Azelaic Acid 15 % cream Apply topically 1 day or 1 dose. After skin is thoroughly washed and patted dry, gently but thoroughly massage a thin film of azelaic acid cream into the affected area twice daily, in the morning and evening.     b complex vitamins tablet Take 1 tablet by mouth daily.     Cholecalciferol (VITAMIN D) 50 MCG (2000 UT) tablet      diclofenac Sodium (VOLTAREN) 1 % GEL Apply 1 application topically 4 (four) times daily. 100 g 3   DULoxetine (CYMBALTA) 20 MG capsule Take 1 capsule (20 mg total) by mouth daily. 90 capsule 3   fluticasone (FLONASE) 50 MCG/ACT nasal spray Place 1-2 sprays into both nostrils daily.      furosemide (LASIX) 20 MG tablet TAKE 1/2 TO 1 TABLET BY MOUTH DAILY AS NEEDED FOR EDEMA 90 tablet 0   hydroxychloroquine (PLAQUENIL) 200 MG tablet Take 200 mg by mouth daily. Take 1 tablet a day     influenza vaccine adjuvanted (FLUAD) 0.5 ML injection Inject into the muscle. 0.5 mL 0   Loratadine 10 MG CAPS Take 1 by mouth daily as needed     LORazepam (ATIVAN) 1 MG tablet TAKE 1/2 TO 1 TABLET BY MOUTH TWO TIMES A DAY AS NEEDED FOR ANXIETY 30 tablet 3   omeprazole (PRILOSEC) 10 MG capsule      Tdap (BOOSTRIX) 5-2.5-18.5 LF-MCG/0.5 injection Inject 0.5 mLs into the muscle. 0.5 mL 0   triamcinolone cream (KENALOG) 0.1 %      atorvastatin (LIPITOR) 80 MG tablet TAKE ONE TABLET BY MOUTH DAILY AT 6:00PM 90 tablet 3   COVID-19 mRNA vaccine 2023-2024 (COMIRNATY) SUSP  injection Inject into the muscle. 0.3 mL 0   dicyclomine (BENTYL) 10 MG capsule TAKE ONE CAPSULE BY MOUTH EVERY 6 TO 8 HOURS AS NEEDED FOR CRAMPING AND SPASMS 90 capsule 1   losartan (COZAAR) 100 MG tablet TAKE 1 TABLET BY MOUTH DAILY 90 tablet 1   influenza vaccine adjuvanted (FLUAD) 0.5 ML injection Inject into the muscle. 0.5 mL 0   No facility-administered medications prior to visit.    ROS: Review of Systems  Constitutional:  Negative for activity change, appetite change, chills, fatigue and unexpected weight change.  HENT:  Negative for congestion, mouth sores and sinus pressure.   Eyes:  Negative for visual disturbance.  Respiratory:  Negative for cough and chest tightness.   Gastrointestinal:  Negative for abdominal pain and nausea.  Genitourinary:  Negative for difficulty urinating, frequency and vaginal pain.  Musculoskeletal:  Positive for arthralgias. Negative for back pain and gait problem.  Skin:  Negative for pallor and rash.  Neurological:  Negative for dizziness, tremors, weakness, numbness and headaches.  Psychiatric/Behavioral:  Negative for confusion and sleep disturbance.     Objective:  BP 130/84 (BP Location: Left Arm, Patient Position: Sitting, Cuff Size: Large)   Pulse 72  Temp 98.6 F (37 C) (Oral)   Ht 5\' 1"  (1.549 m)   Wt 177 lb (80.3 kg)   LMP 11/24/1982   SpO2 97%   BMI 33.44 kg/m   BP Readings from Last 3 Encounters:  03/20/23 130/84  12/18/22 122/76  06/19/22 118/78    Wt Readings from Last 3 Encounters:  03/20/23 177 lb (80.3 kg)  12/18/22 173 lb (78.5 kg)  06/19/22 170 lb 6.4 oz (77.3 kg)    Physical Exam Constitutional:      General: She is not in acute distress.    Appearance: She is well-developed. She is obese.  HENT:     Head: Normocephalic.     Right Ear: External ear normal.     Left Ear: External ear normal.     Nose: Nose normal.  Eyes:     General:        Right eye: No discharge.        Left eye: No discharge.      Conjunctiva/sclera: Conjunctivae normal.     Pupils: Pupils are equal, round, and reactive to light.  Neck:     Thyroid: No thyromegaly.     Vascular: No JVD.     Trachea: No tracheal deviation.  Cardiovascular:     Rate and Rhythm: Normal rate and regular rhythm.     Heart sounds: Normal heart sounds.  Pulmonary:     Effort: No respiratory distress.     Breath sounds: No stridor. No wheezing.  Abdominal:     General: Bowel sounds are normal. There is no distension.     Palpations: Abdomen is soft. There is no mass.     Tenderness: There is no abdominal tenderness. There is no guarding or rebound.  Musculoskeletal:        General: No tenderness.     Cervical back: Normal range of motion and neck supple. No rigidity.  Lymphadenopathy:     Cervical: No cervical adenopathy.  Skin:    Findings: No erythema or rash.  Neurological:     Mental Status: She is oriented to person, place, and time.     Cranial Nerves: No cranial nerve deficit.     Motor: No abnormal muscle tone.     Coordination: Coordination normal.     Deep Tendon Reflexes: Reflexes normal.  Psychiatric:        Behavior: Behavior normal.        Thought Content: Thought content normal.        Judgment: Judgment normal.   Hand joints w/pain  Lab Results  Component Value Date   WBC 5.9 12/18/2022   HGB 13.1 12/18/2022   HCT 39.7 12/18/2022   PLT 288.0 12/18/2022   GLUCOSE 92 12/18/2022   CHOL 154 12/18/2022   TRIG 61.0 12/18/2022   HDL 88.40 12/18/2022   LDLCALC 53 12/18/2022   ALT 18 12/18/2022   AST 21 12/18/2022   NA 138 12/18/2022   K 4.3 12/18/2022   CL 102 12/18/2022   CREATININE 0.66 12/18/2022   BUN 14 12/18/2022   CO2 29 12/18/2022   TSH 1.70 12/18/2022   INR 1.0 06/27/2021   HGBA1C 5.9 06/27/2021    MM 3D SCREEN BREAST BILATERAL  Result Date: 06/08/2022 CLINICAL DATA:  Screening. EXAM: DIGITAL SCREENING BILATERAL MAMMOGRAM WITH TOMOSYNTHESIS AND CAD TECHNIQUE: Bilateral screening digital  craniocaudal and mediolateral oblique mammograms were obtained. Bilateral screening digital breast tomosynthesis was performed. The images were evaluated with computer-aided detection. COMPARISON:  Previous exam(s). ACR  Breast Density Category b: There are scattered areas of fibroglandular density. FINDINGS: There are no findings suspicious for malignancy. IMPRESSION: No mammographic evidence of malignancy. A result letter of this screening mammogram will be mailed directly to the patient. RECOMMENDATION: Screening mammogram in one year. (Code:SM-B-01Y) BI-RADS CATEGORY  1: Negative. Electronically Signed   By: Frederico Hamman M.D.   On: 06/08/2022 09:38    Assessment & Plan:   Problem List Items Addressed This Visit     Dyslipidemia - Primary    Cont on Lipitor      Relevant Medications   atorvastatin (LIPITOR) 80 MG tablet   Essential hypertension    Cont on Amlodipine, Losartan      Relevant Medications   atorvastatin (LIPITOR) 80 MG tablet   losartan (COZAAR) 100 MG tablet   CAD (coronary artery disease)    On Lipitor, ASA      Relevant Medications   atorvastatin (LIPITOR) 80 MG tablet   losartan (COZAAR) 100 MG tablet   Anxiety attack    Lorazepam prn  Potential benefits of a long term benzodiazepines  use as well as potential risks  and complications were explained to the patient and were aknowledged. On a low dose Cymbalta OK w/a low dose Tramadol - HA w>25 mg dose         Meds ordered this encounter  Medications   atorvastatin (LIPITOR) 80 MG tablet    Sig: TAKE ONE TABLET BY MOUTH DAILY AT 6:00PM    Dispense:  90 tablet    Refill:  3   dicyclomine (BENTYL) 10 MG capsule    Sig: TAKE ONE CAPSULE BY MOUTH EVERY 6 TO 8 HOURS AS NEEDED FOR CRAMPING AND SPASMS    Dispense:  90 capsule    Refill:  2   losartan (COZAAR) 100 MG tablet    Sig: Take 1 tablet (100 mg total) by mouth daily.    Dispense:  90 tablet    Refill:  3      Follow-up: Return in about 6  months (around 09/19/2023) for a follow-up visit.  Sonda Primes, MD

## 2023-03-20 NOTE — Assessment & Plan Note (Signed)
Cont on Lipitor 

## 2023-03-20 NOTE — Assessment & Plan Note (Signed)
Lorazepam prn  Potential benefits of a long term benzodiazepines  use as well as potential risks  and complications were explained to the patient and were aknowledged. On a low dose Cymbalta OK w/a low dose Tramadol - HA w>25 mg dose 

## 2023-03-20 NOTE — Assessment & Plan Note (Signed)
Cont on Amlodipine, Losartan 

## 2023-04-23 DIAGNOSIS — M064 Inflammatory polyarthropathy: Secondary | ICD-10-CM | POA: Diagnosis not present

## 2023-04-23 DIAGNOSIS — E669 Obesity, unspecified: Secondary | ICD-10-CM | POA: Diagnosis not present

## 2023-04-23 DIAGNOSIS — Z6835 Body mass index (BMI) 35.0-35.9, adult: Secondary | ICD-10-CM | POA: Diagnosis not present

## 2023-04-23 DIAGNOSIS — M310 Hypersensitivity angiitis: Secondary | ICD-10-CM | POA: Diagnosis not present

## 2023-04-23 DIAGNOSIS — M79642 Pain in left hand: Secondary | ICD-10-CM | POA: Diagnosis not present

## 2023-04-23 DIAGNOSIS — M79671 Pain in right foot: Secondary | ICD-10-CM | POA: Diagnosis not present

## 2023-04-23 DIAGNOSIS — R5383 Other fatigue: Secondary | ICD-10-CM | POA: Diagnosis not present

## 2023-04-23 DIAGNOSIS — M1991 Primary osteoarthritis, unspecified site: Secondary | ICD-10-CM | POA: Diagnosis not present

## 2023-04-23 DIAGNOSIS — M79641 Pain in right hand: Secondary | ICD-10-CM | POA: Diagnosis not present

## 2023-04-23 DIAGNOSIS — M79672 Pain in left foot: Secondary | ICD-10-CM | POA: Diagnosis not present

## 2023-04-23 DIAGNOSIS — M0609 Rheumatoid arthritis without rheumatoid factor, multiple sites: Secondary | ICD-10-CM | POA: Diagnosis not present

## 2023-04-23 DIAGNOSIS — Z79899 Other long term (current) drug therapy: Secondary | ICD-10-CM | POA: Diagnosis not present

## 2023-04-24 ENCOUNTER — Ambulatory Visit (AMBULATORY_SURGERY_CENTER): Payer: Medicare Other

## 2023-04-24 VITALS — Ht 60.5 in | Wt 175.0 lb

## 2023-04-24 DIAGNOSIS — Z86010 Personal history of colonic polyps: Secondary | ICD-10-CM

## 2023-04-24 MED ORDER — NA SULFATE-K SULFATE-MG SULF 17.5-3.13-1.6 GM/177ML PO SOLN
1.0000 | Freq: Once | ORAL | 0 refills | Status: AC
Start: 2023-04-24 — End: 2023-04-24

## 2023-04-24 NOTE — Progress Notes (Signed)
No egg or soy allergy known to patient  No issues known to pt with past sedation with any surgeries or procedures Patient denies ever being told they had issues or difficulty with intubation  No FH of Malignant Hyperthermia Pt is not on diet pills Pt is not on  home 02  Pt is not on blood thinners  Pt denies issues with constipation  No A fib or A flutter Have any cardiac testing pending--no  LOA: independent  Prep: suprep   Patient's chart reviewed by Cathlyn Parsons CNRA prior to previsit and patient appropriate for the [facility].  Previsit completed and red dot placed by patient's name on their procedure day (on provider's schedule).     PV competed with patient. Prep instructions sent via mychart and home address. Goodrx coupon for [employer] provided to use for price reduction if needed.

## 2023-05-07 DIAGNOSIS — M0609 Rheumatoid arthritis without rheumatoid factor, multiple sites: Secondary | ICD-10-CM | POA: Diagnosis not present

## 2023-05-09 ENCOUNTER — Ambulatory Visit (AMBULATORY_SURGERY_CENTER): Payer: Medicare Other | Admitting: Gastroenterology

## 2023-05-09 ENCOUNTER — Encounter: Payer: Self-pay | Admitting: Gastroenterology

## 2023-05-09 VITALS — BP 162/76 | HR 71 | Temp 96.4°F | Resp 12 | Ht 61.0 in | Wt 175.0 lb

## 2023-05-09 DIAGNOSIS — Z09 Encounter for follow-up examination after completed treatment for conditions other than malignant neoplasm: Secondary | ICD-10-CM | POA: Diagnosis not present

## 2023-05-09 DIAGNOSIS — D124 Benign neoplasm of descending colon: Secondary | ICD-10-CM | POA: Diagnosis not present

## 2023-05-09 DIAGNOSIS — Z86010 Personal history of colonic polyps: Secondary | ICD-10-CM

## 2023-05-09 DIAGNOSIS — M069 Rheumatoid arthritis, unspecified: Secondary | ICD-10-CM | POA: Diagnosis not present

## 2023-05-09 DIAGNOSIS — D123 Benign neoplasm of transverse colon: Secondary | ICD-10-CM | POA: Diagnosis not present

## 2023-05-09 DIAGNOSIS — I10 Essential (primary) hypertension: Secondary | ICD-10-CM | POA: Diagnosis not present

## 2023-05-09 MED ORDER — SODIUM CHLORIDE 0.9 % IV SOLN
500.0000 mL | Freq: Once | INTRAVENOUS | Status: DC
Start: 2023-05-09 — End: 2023-05-09

## 2023-05-09 NOTE — Progress Notes (Signed)
History & Physical  Primary Care Physician:  Tresa Garter, MD Primary Gastroenterologist: Claudette Head, MD  Impression / Plan:  Personal history of adenomatous colon polyps for colonoscopy.   CHIEF COMPLAINT:  Personal history of colon polyps   HPI: Jennifer Davenport is a 72 y.o. female with a personal history of adenomatous colon polyps for colonoscopy.here for colonoscopy.    Past Medical History:  Diagnosis Date   Abnormal Pap smear of cervix    Allergy    Atrophic vaginitis    Bulging disc 10/2012   CERVICAL   Bursitis of hip    Cataract    mild starting of    Endometriosis    GERD (gastroesophageal reflux disease)    Hyperlipidemia    Hypertension    IBS (irritable bowel syndrome)     Past Surgical History:  Procedure Laterality Date   ABDOMINAL HYSTERECTOMY  11/1982   AUB & endometriosis, ovaries remain   APPENDECTOMY  1/72   BREAST LUMPECTOMY WITH RADIOACTIVE SEED LOCALIZATION Right 04/29/2021   Procedure: RIGHT BREAST LUMPECTOMY WITH RADIOACTIVE SEED LOCALIZATION;  Surgeon: Griselda Miner, MD;  Location: [facility];  Service: General;  Laterality: Right;   BUNIONECTOMY Left 04/2004   left foot   CATARACT EXTRACTION     CERVIX LESION DESTRUCTION     COLONOSCOPY     CRYOTHERAPY     for abnormal pap smear   eyelid surgery Bilateral 2018   HYSTEROTOMY  5/72   fetal demise, also BTL   LAPAROSCOPIC CHOLECYSTECTOMY  02/2005   POLYPECTOMY     TMJ ARTHROPLASTY  6/94   secondary to hockey injury 15 years earlier   TUBAL LIGATION Bilateral 5/72   UPPER GASTROINTESTINAL ENDOSCOPY      Prior to Admission medications   Medication Sig Start Date End Date Taking? Authorizing Provider  acetaminophen (TYLENOL) 500 MG tablet Take 500 mg by mouth every 6 (six) hours as needed.   Yes [provider]  amLODipine (NORVASC) 5 MG tablet Take 1 tablet (5 mg total) by mouth daily. 12/18/22  Yes Plotnikov, Georgina Quint, MD  atorvastatin (LIPITOR)  80 MG tablet TAKE ONE TABLET BY MOUTH DAILY AT 6:00PM 03/20/23  Yes Plotnikov, Georgina Quint, MD  b complex vitamins tablet Take 1 tablet by mouth daily.   Yes [provider]  Cholecalciferol (VITAMIN D) 50 MCG (2000 UT) tablet Take 2,000 Units by mouth every other day. 10/29/18  Yes [provider]  diclofenac Sodium (VOLTAREN) 1 % GEL Apply 1 application topically 4 (four) times daily. 12/09/19  Yes Plotnikov, Georgina Quint, MD  DULoxetine (CYMBALTA) 20 MG capsule Take 1 capsule (20 mg total) by mouth daily. 12/18/22  Yes Plotnikov, Georgina Quint, MD  fluticasone (FLONASE) 50 MCG/ACT nasal spray Place 1-2 sprays into both nostrils daily.    Yes [provider]  hydroxychloroquine (PLAQUENIL) 200 MG tablet Take 200 mg by mouth 2 (two) times daily. Take 1 tablet a day 08/27/20  Yes [provider]  losartan (COZAAR) 100 MG tablet Take 1 tablet (100 mg total) by mouth daily. 03/20/23  Yes Plotnikov, Georgina Quint, MD  omeprazole (PRILOSEC) 10 MG capsule  12/25/19  Yes [provider]  aspirin 81 MG EC tablet Take 1 by mouth daily 01/24/19   [provider]  Azelaic Acid 15 % cream Apply topically 1 day or 1 dose. After skin is thoroughly washed and patted dry, gently but thoroughly massage a thin film of azelaic acid cream  into the affected area twice daily, in the morning and evening. Patient not taking: Reported on 04/24/2023    [provider]  dicyclomine (BENTYL) 10 MG capsule TAKE ONE CAPSULE BY MOUTH EVERY 6 TO 8 HOURS AS NEEDED FOR CRAMPING AND SPASMS 03/20/23   Plotnikov, Georgina Quint, MD  furosemide (LASIX) 20 MG tablet TAKE 1/2 TO 1 TABLET BY MOUTH DAILY AS NEEDED FOR EDEMA Patient not taking: Reported on 04/24/2023 04/21/22   Plotnikov, Georgina Quint, MD  influenza vaccine adjuvanted (FLUAD) 0.5 ML injection Inject into the muscle. 06/21/22   Judyann Munson, MD  LORazepam (ATIVAN) 1 MG tablet TAKE 1/2 TO 1 TABLET BY MOUTH TWO TIMES A DAY AS NEEDED FOR ANXIETY  06/19/22   Plotnikov, Georgina Quint, MD  Tdap (BOOSTRIX) 5-2.5-18.5 LF-MCG/0.5 injection Inject 0.5 mLs into the muscle. 01/22/23   Judyann Munson, MD  triamcinolone cream (KENALOG) 0.1 %  03/06/18   [provider]    Current Outpatient Medications  Medication Sig Dispense Refill   acetaminophen (TYLENOL) 500 MG tablet Take 500 mg by mouth every 6 (six) hours as needed.     amLODipine (NORVASC) 5 MG tablet Take 1 tablet (5 mg total) by mouth daily. 90 tablet 3   atorvastatin (LIPITOR) 80 MG tablet TAKE ONE TABLET BY MOUTH DAILY AT 6:00PM 90 tablet 3   b complex vitamins tablet Take 1 tablet by mouth daily.     Cholecalciferol (VITAMIN D) 50 MCG (2000 UT) tablet Take 2,000 Units by mouth every other day.     diclofenac Sodium (VOLTAREN) 1 % GEL Apply 1 application topically 4 (four) times daily. 100 g 3   DULoxetine (CYMBALTA) 20 MG capsule Take 1 capsule (20 mg total) by mouth daily. 90 capsule 3   fluticasone (FLONASE) 50 MCG/ACT nasal spray Place 1-2 sprays into both nostrils daily.      hydroxychloroquine (PLAQUENIL) 200 MG tablet Take 200 mg by mouth 2 (two) times daily. Take 1 tablet a day     losartan (COZAAR) 100 MG tablet Take 1 tablet (100 mg total) by mouth daily. 90 tablet 3   omeprazole (PRILOSEC) 10 MG capsule      aspirin 81 MG EC tablet Take 1 by mouth daily     Azelaic Acid 15 % cream Apply topically 1 day or 1 dose. After skin is thoroughly washed and patted dry, gently but thoroughly massage a thin film of azelaic acid cream into the affected area twice daily, in the morning and evening. (Patient not taking: Reported on 04/24/2023)     dicyclomine (BENTYL) 10 MG capsule TAKE ONE CAPSULE BY MOUTH EVERY 6 TO 8 HOURS AS NEEDED FOR CRAMPING AND SPASMS 90 capsule 2   furosemide (LASIX) 20 MG tablet TAKE 1/2 TO 1 TABLET BY MOUTH DAILY AS NEEDED FOR EDEMA (Patient not taking: Reported on 04/24/2023) 90 tablet 0   influenza vaccine adjuvanted (FLUAD) 0.5 ML injection Inject into the  muscle. 0.5 mL 0   LORazepam (ATIVAN) 1 MG tablet TAKE 1/2 TO 1 TABLET BY MOUTH TWO TIMES A DAY AS NEEDED FOR ANXIETY 30 tablet 3   Tdap (BOOSTRIX) 5-2.5-18.5 LF-MCG/0.5 injection Inject 0.5 mLs into the muscle. 0.5 mL 0   triamcinolone cream (KENALOG) 0.1 %      Current Facility-Administered Medications  Medication Dose Route Frequency Provider Last Rate Last Admin   0.9 %  sodium chloride infusion  500 mL Intravenous Once Meryl Dare, MD        Allergies as  of 05/09/2023 - Review Complete 05/09/2023  Allergen Reaction Noted   Ceftriaxone sodium     Levaquin [levofloxacin] Anaphylaxis 04/21/2021   Rocephin [ceftriaxone] Anaphylaxis 04/21/2021   Biaxin [clarithromycin]  03/18/2013   Sulfonamide derivatives Nausea Only    Aspirin     Benadryl [diphenhydramine]  03/10/2019   Codeine     Dairy aid [tilactase]  06/29/2015   Gluten meal  06/29/2015   Levaquin [levofloxacin in d5w]  01/30/2014   Molnupiravir  12/18/2022   Pneumovax 23 [pneumococcal vac polyvalent]  03/10/2019   Rocephin [ceftriaxone sodium in dextrose]  04/17/2013   Simvastatin  08/22/2016    Family History  Problem Relation Age of Onset   Diverticulitis Mother    Breast cancer Mother 68   Hypertension Mother    Heart disease Mother    Osteoarthritis Mother    CVA Mother    Hypercholesterolemia Mother    Colon polyps Mother    Heart disease Brother    Hypertension Brother    Lung cancer Brother 59       lung cancer, MI, ETOH   Heart disease Sister    Hypertension Sister    Osteoarthritis Sister    Hypertension Father    Hypertension Maternal Uncle    Heart disease Maternal Uncle    Hypertension Maternal Grandfather    Heart disease Maternal Grandfather    Colon cancer Neg Hx    Rectal cancer Neg Hx    Stomach cancer Neg Hx     Social History   Socioeconomic History   Marital status: Married    Spouse name: Not on file   Number of children: Not on file   Years of education: Not on file    Highest education level: Bachelor's degree (e.g., BA, AB, BS)  Occupational History   Not on file  Tobacco Use   Smoking status: Never   Smokeless tobacco: Never  Vaping Use   Vaping status: Never Used  Substance and Sexual Activity   Alcohol use: Not Currently   Drug use: No   Sexual activity: Yes    Birth control/protection: Surgical    Comment: hysterectomy   Other Topics Concern   Not on file  Social History Narrative   Lives with husband at home.  Retired.  Education BS degree.  2-3 cups coffee daily.     Social Determinants of Health   Financial Resource Strain: Low Risk  (12/14/2022)   Overall Financial Resource Strain (CARDIA)    Difficulty of Paying Living Expenses: Not hard at all  Food Insecurity: No Food Insecurity (12/14/2022)   Hunger Vital Sign    Worried About Running Out of Food in the Last Year: Never true    Ran Out of Food in the Last Year: Never true  Transportation Needs: No Transportation Needs (12/14/2022)   PRAPARE - Administrator, Civil Service (Medical): No    Lack of Transportation (Non-Medical): No  Physical Activity: Sufficiently Active (12/14/2022)   Exercise Vital Sign    Days of Exercise per Week: 5 days    Minutes of Exercise per Session: 30 min  Stress: Stress Concern Present (12/14/2022)   [employer] of Occupational Health - Occupational Stress Questionnaire    Feeling of Stress : To some extent  Social Connections: Socially Integrated (12/14/2022)   Social Connection and Isolation Panel [NHANES]    Frequency of Communication with Friends and Family: More than three times a week    Frequency of Social Gatherings with Friends  and Family: Once a week    Attends Religious Services: More than 4 times per year    Active Member of Clubs or Organizations: Yes    Attends Banker Meetings: More than 4 times per year    Marital Status: Married  Catering manager Violence: Not At Risk (06/19/2022)   Humiliation, Afraid,  Rape, and Kick questionnaire    Fear of Current or Ex-Partner: No    Emotionally Abused: No    Physically Abused: No    Sexually Abused: No    Review of Systems:  All systems reviewed were negative except where noted in HPI.   Physical Exam:  General:  Alert, well-developed, in NAD Head:  Normocephalic and atraumatic. Eyes:  Sclera clear, no icterus.   Conjunctiva pink. Ears:  Normal auditory acuity. Mouth:  No deformity or lesions.  Neck:  Supple; no masses. Lungs:  Clear throughout to auscultation.   No wheezes, crackles, or rhonchi.  Heart:  Regular rate and rhythm; no murmurs. Abdomen:  Soft, nondistended, nontender. No masses, hepatomegaly. No palpable masses.  Normal bowel sounds.    Rectal:  Deferred   Msk:  Symmetrical without gross deformities. Extremities:  Without edema. Neurologic:  Alert and  oriented x 4; grossly normal neurologically. Skin:  Intact without significant lesions or rashes. Psych:  Alert and cooperative. Normal mood and affect.   Venita Lick. Russella Dar  05/09/2023, 8:08 AM See Loretha Stapler, [facility], to contact our on call provider

## 2023-05-09 NOTE — Patient Instructions (Signed)
Resume previous diet and medications. Awaiting pathology results. Repeat Colonoscopy date to be determined based on pathology results. Handouts provided on Colon polyps, Diverticulosis and Hemorrhoids   YOU HAD AN ENDOSCOPIC PROCEDURE TODAY AT [facility]:   Refer to the procedure report that was given to you for any specific questions about what was found during the examination.  If the procedure report does not answer your questions, please call your gastroenterologist to clarify.  If you requested that your care partner not be given the details of your procedure findings, then the procedure report has been included in a sealed envelope for you to review at your convenience later.  YOU SHOULD EXPECT: Some feelings of bloating in the abdomen. Passage of more gas than usual.  Walking can help get rid of the air that was put into your GI tract during the procedure and reduce the bloating. If you had a lower endoscopy (such as a colonoscopy or flexible sigmoidoscopy) you may notice spotting of blood in your stool or on the toilet paper. If you underwent a bowel prep for your procedure, you may not have a normal bowel movement for a few days.  Please Note:  You might notice some irritation and congestion in your nose or some drainage.  This is from the oxygen used during your procedure.  There is no need for concern and it should clear up in a day or so.  SYMPTOMS TO REPORT IMMEDIATELY:  Following lower endoscopy (colonoscopy or flexible sigmoidoscopy):  Excessive amounts of blood in the stool  Significant tenderness or worsening of abdominal pains  Swelling of the abdomen that is new, acute  Fever of 100F or higher  For urgent or emergent issues, a gastroenterologist can be reached at any hour by calling [phone]. Do not use MyChart messaging for urgent concerns.    DIET:  We do recommend a small meal at first, but then you may proceed to your regular diet.  Drink plenty of  fluids but you should avoid alcoholic beverages for 24 hours.  ACTIVITY:  You should plan to take it easy for the rest of today and you should NOT DRIVE or use heavy machinery until tomorrow (because of the sedation medicines used during the test).    FOLLOW UP: Our staff will call the number listed on your records the next business day following your procedure.  We will call around 7:15- 8:00 am to check on you and address any questions or concerns that you may have regarding the information given to you following your procedure. If we do not reach you, we will leave a message.     If any biopsies were taken you will be contacted by phone or by letter within the next 1-3 weeks.  Please call us at [phone] if you have not heard about the biopsies in 3 weeks.    SIGNATURES/CONFIDENTIALITY: You and/or your care partner have signed paperwork which will be entered into your electronic medical record.  These signatures attest to the fact that that the information above on your After Visit Summary has been reviewed and is understood.  Full responsibility of the confidentiality of this discharge information lies with you and/or your care-partner.

## 2023-05-09 NOTE — Op Note (Signed)
[facility] Patient Name: Jennifer Davenport Procedure Date: 05/09/2023 8:07 AM MRN: [id number] Endoscopist: Meryl Dare , MD, [id number] Age: 72 Referring MD:  Date of Birth: 04/15/1951 Gender: Female Account #: [id number] Procedure:                Colonoscopy Indications:              Surveillance: Personal history of adenomatous                            polyps on last colonoscopy 5 years ago Medicines:                Monitored Anesthesia Care Procedure:                Pre-Anesthesia Assessment:                           - Prior to the procedure, a History and Physical                            was performed, and patient medications and                            allergies were reviewed. The patient's tolerance of                            previous anesthesia was also reviewed. The risks                            and benefits of the procedure and the sedation                            options and risks were discussed with the patient.                            All questions were answered, and informed consent                            was obtained. Prior Anticoagulants: The patient has                            taken no anticoagulant or antiplatelet agents. ASA                            Grade Assessment: II - A patient with mild systemic                            disease. After reviewing the risks and benefits,                            the patient was deemed in satisfactory condition to                            undergo the procedure.  After obtaining informed consent, the colonoscope                            was passed under direct vision. Throughout the                            procedure, the patient's blood pressure, pulse, and                            oxygen saturations were monitored continuously. The                            CF [id number] #[id number] was introduced through the anus                            and advanced to the  the cecum, identified by                            appendiceal orifice and ileocecal valve. The                            ileocecal valve, appendiceal orifice, and rectum                            were photographed. The quality of the bowel                            preparation was good. The colonoscopy was performed                            without difficulty. The patient tolerated the                            procedure well. Scope In: 8:13:36 AM Scope Out: 8:26:30 AM Scope Withdrawal Time: 0 hours 9 minutes 30 seconds  Total Procedure Duration: 0 hours 12 minutes 54 seconds  Findings:                 The perianal and digital rectal examinations were                            normal.                           Three sessile polyps were found in the descending                            colon (2) and transverse colon (1). The polyps were                            5 to 6 mm in size. These polyps were removed with a                            cold snare. Resection and retrieval were complete.  A single small localized angioectasia without                            bleeding was found in the ascending colon.                           Multiple medium-mouthed and small-mouthed                            diverticula were found in the left colon. There was                            evidence of diverticular spasm. There was no                            evidence of diverticular bleeding.                           Internal hemorrhoids were found during                            retroflexion. The hemorrhoids were small and Grade                            I (internal hemorrhoids that do not prolapse).                           The exam was otherwise without abnormality on                            direct and retroflexion views. Complications:            No immediate complications. Estimated blood loss:                            None. Estimated Blood Loss:      Estimated blood loss: none. Impression:               - Three 5 to 6 mm polyps in the descending colon                            and in the transverse colon, removed with a cold                            snare. Resected and retrieved.                           - Small ascending colon AVM.                           - Moderate diverticulosis in the left colon.                           - Internal hemorrhoids.                           -  The examination was otherwise normal on direct                            and retroflexion views. Recommendation:           - Repeat colonoscopy after studies are complete for                            surveillance based on pathology results.                           - Patient has a contact number available for                            emergencies. The signs and symptoms of potential                            delayed complications were discussed with the                            patient. Return to normal activities tomorrow.                            Written discharge instructions were provided to the                            patient.                           - High fiber diet.                           - Continue present medications.                           - Await pathology results. Meryl Dare, MD 05/09/2023 8:33:23 AM This report has been signed electronically.

## 2023-05-09 NOTE — Progress Notes (Signed)
Pt's states no medical or surgical changes since previsit or office visit. 

## 2023-05-09 NOTE — Progress Notes (Signed)
Called to room to assist during endoscopic procedure.  Patient ID and intended procedure confirmed with present staff. Received instructions for my participation in the procedure from the performing physician.  

## 2023-05-09 NOTE — Progress Notes (Signed)
Report to PACU, RN, vss, BBS= Clear.  

## 2023-05-10 ENCOUNTER — Telehealth: Payer: Self-pay | Admitting: *Deleted

## 2023-05-10 NOTE — Telephone Encounter (Signed)
  Follow up Call-     05/09/2023    7:23 AM  Call back number  Post procedure Call Back phone  # [phone]  Permission to leave phone message Yes     Patient questions:  Do you have a fever, pain , or abdominal swelling? No. Pain Score  0 *  Have you tolerated food without any problems? Yes.    Have you been able to return to your normal activities? Yes.    Do you have any questions about your discharge instructions: Diet   No. Medications  No. Follow up visit  No.  Do you have questions or concerns about your Care? No.  Actions: * If pain score is 4 or above: No action needed, pain <4.

## 2023-05-22 ENCOUNTER — Encounter: Payer: Self-pay | Admitting: Gastroenterology

## 2023-05-30 DIAGNOSIS — Z4789 Encounter for other orthopedic aftercare: Secondary | ICD-10-CM | POA: Diagnosis not present

## 2023-06-04 DIAGNOSIS — M0609 Rheumatoid arthritis without rheumatoid factor, multiple sites: Secondary | ICD-10-CM | POA: Diagnosis not present

## 2023-06-11 DIAGNOSIS — Z961 Presence of intraocular lens: Secondary | ICD-10-CM | POA: Diagnosis not present

## 2023-06-11 DIAGNOSIS — H31003 Unspecified chorioretinal scars, bilateral: Secondary | ICD-10-CM | POA: Diagnosis not present

## 2023-06-11 DIAGNOSIS — Z79899 Other long term (current) drug therapy: Secondary | ICD-10-CM | POA: Diagnosis not present

## 2023-06-11 DIAGNOSIS — H26493 Other secondary cataract, bilateral: Secondary | ICD-10-CM | POA: Diagnosis not present

## 2023-06-11 LAB — HM DIABETES EYE EXAM

## 2023-06-22 ENCOUNTER — Other Ambulatory Visit (HOSPITAL_BASED_OUTPATIENT_CLINIC_OR_DEPARTMENT_OTHER): Payer: Self-pay

## 2023-06-22 DIAGNOSIS — Z23 Encounter for immunization: Secondary | ICD-10-CM | POA: Diagnosis not present

## 2023-06-22 MED ORDER — COVID-19 MRNA VAC-TRIS(PFIZER) 30 MCG/0.3ML IM SUSY
0.3000 mL | PREFILLED_SYRINGE | Freq: Once | INTRAMUSCULAR | 0 refills | Status: AC
  Filled 2023-06-22: qty 0.3, 1d supply, fill #0

## 2023-06-26 DIAGNOSIS — Z1231 Encounter for screening mammogram for malignant neoplasm of breast: Secondary | ICD-10-CM | POA: Diagnosis not present

## 2023-06-29 ENCOUNTER — Other Ambulatory Visit (HOSPITAL_BASED_OUTPATIENT_CLINIC_OR_DEPARTMENT_OTHER): Payer: Self-pay

## 2023-06-29 DIAGNOSIS — Z23 Encounter for immunization: Secondary | ICD-10-CM | POA: Diagnosis not present

## 2023-06-29 MED ORDER — INFLUENZA VAC A&B SURF ANT ADJ 0.5 ML IM SUSY
0.5000 mL | PREFILLED_SYRINGE | Freq: Once | INTRAMUSCULAR | 0 refills | Status: AC
  Filled 2023-06-29: qty 0.5, 1d supply, fill #0

## 2023-07-20 ENCOUNTER — Other Ambulatory Visit (HOSPITAL_BASED_OUTPATIENT_CLINIC_OR_DEPARTMENT_OTHER): Payer: Self-pay

## 2023-07-20 MED ORDER — ZOSTER VAC RECOMB ADJUVANTED 50 MCG/0.5ML IM SUSR
0.5000 mL | Freq: Once | INTRAMUSCULAR | 0 refills | Status: AC
  Filled 2023-07-20: qty 0.5, 1d supply, fill #0

## 2023-07-24 DIAGNOSIS — M310 Hypersensitivity angiitis: Secondary | ICD-10-CM | POA: Diagnosis not present

## 2023-07-24 DIAGNOSIS — M0609 Rheumatoid arthritis without rheumatoid factor, multiple sites: Secondary | ICD-10-CM | POA: Diagnosis not present

## 2023-07-24 DIAGNOSIS — Z79899 Other long term (current) drug therapy: Secondary | ICD-10-CM | POA: Diagnosis not present

## 2023-07-24 DIAGNOSIS — E669 Obesity, unspecified: Secondary | ICD-10-CM | POA: Diagnosis not present

## 2023-07-24 DIAGNOSIS — Z6834 Body mass index (BMI) 34.0-34.9, adult: Secondary | ICD-10-CM | POA: Diagnosis not present

## 2023-07-24 DIAGNOSIS — M1991 Primary osteoarthritis, unspecified site: Secondary | ICD-10-CM | POA: Diagnosis not present

## 2023-07-30 DIAGNOSIS — M0609 Rheumatoid arthritis without rheumatoid factor, multiple sites: Secondary | ICD-10-CM | POA: Diagnosis not present

## 2023-08-09 ENCOUNTER — Other Ambulatory Visit: Payer: Self-pay | Admitting: Medical Genetics

## 2023-08-09 DIAGNOSIS — Z006 Encounter for examination for normal comparison and control in clinical research program: Secondary | ICD-10-CM

## 2023-09-06 ENCOUNTER — Other Ambulatory Visit (HOSPITAL_COMMUNITY)
Admission: RE | Admit: 2023-09-06 | Discharge: 2023-09-06 | Disposition: A | Discharge status: Home / Self Care | Payer: Self-pay | Source: Ambulatory Visit | Attending: Medical Genetics | Admitting: Medical Genetics

## 2023-09-06 DIAGNOSIS — Z006 Encounter for examination for normal comparison and control in clinical research program: Secondary | ICD-10-CM | POA: Insufficient documentation

## 2023-09-07 ENCOUNTER — Ambulatory Visit (INDEPENDENT_AMBULATORY_CARE_PROVIDER_SITE_OTHER): Payer: Medicare Other

## 2023-09-07 VITALS — Ht 61.5 in | Wt 175.0 lb

## 2023-09-07 DIAGNOSIS — Z0000 Encounter for general adult medical examination without abnormal findings: Secondary | ICD-10-CM

## 2023-09-07 NOTE — Patient Instructions (Signed)
Jennifer Davenport , Thank you for taking time to come for your Medicare Wellness Visit. I appreciate your ongoing commitment to your health goals. Please review the following plan we discussed and let me know if I can assist you in the future.   Referrals/Orders/Follow-Ups/Clinician Recommendations: Please bring a record of last Covid vaccine, mammogram and bone density for our records during your up coming visit with Dr. Posey Rea, as I did not find items in your chart.  It was nice talking with you today.  Merry Christmas.  This is a list of the screening recommended for you and due dates:  Health Maintenance  Topic Date Due   Yearly kidney health urinalysis for diabetes  Never done   Complete foot exam   09/12/2023*   Hemoglobin A1C  09/12/2023*   COVID-19 Vaccine (8 - 2024-25 season) 09/25/2023*   Zoster (Shingles) Vaccine (2 of 2) 09/14/2023   Yearly kidney function blood test for diabetes  12/18/2023   Eye exam for diabetics  06/05/2024   Mammogram  06/06/2024   Medicare Annual Wellness Visit  09/06/2024   Colon Cancer Screening  05/08/2026   DTaP/Tdap/Td vaccine (4 - Td or Tdap) 01/21/2033   Pneumonia Vaccine  Completed   Flu Shot  Completed   DEXA scan (bone density measurement)  Completed   Hepatitis C Screening  Completed   HPV Vaccine  Aged Out  *Topic was postponed. The date shown is not the original due date.    Advanced directives: (Copy Requested) Please bring a copy of your health care power of attorney and living will to the office to be added to your chart at your convenience.  Next Medicare Annual Wellness Visit scheduled for next year: Yes

## 2023-09-07 NOTE — Progress Notes (Signed)
Subjective:   Jennifer Davenport is a 72 y.o. female who presents for Medicare Annual (Subsequent) preventive examination.  Visit Complete: Virtual I connected with  Jennifer Davenport on 09/07/23 by a audio enabled telemedicine application and verified that I am speaking with the correct person using two identifiers.  Patient Location: Home  Provider Location: Office/Clinic  I discussed the limitations of evaluation and management by telemedicine. The patient expressed understanding and agreed to proceed.  Vital Signs: Because this visit was a virtual/telehealth visit, some criteria may be missing or patient reported. Any vitals not documented were not able to be obtained and vitals that have been documented are patient reported.  Patient Medicare AWV questionnaire was completed by the patient on 09/03/2023; I have confirmed that all information answered by patient is correct and no changes since this date.  Cardiac Risk Factors include: advanced age (>9men, >50 women);hypertension;Other (see comment);dyslipidemia, Risk factor comments: CAD,     Objective:    Today's Vitals   09/07/23 0822  Weight: 175 lb (79.4 kg)  Height: 5' 1.5" (1.562 m)   Body mass index is 32.53 kg/m.     09/07/2023    8:27 AM 06/19/2022    2:20 PM 04/29/2021   10:10 AM 04/21/2021    3:25 PM 10/06/2018    2:03 AM 07/19/2017    9:55 AM  Advanced Directives  Does Patient Have a Medical Advance Directive? Yes Yes Yes Yes Yes No  Type of Estate agent of [place];Living will Living will Healthcare Power of [place];Living will Living will;Healthcare Power of [employer] Power of [place];Living will   Does patient want to make changes to medical advance directive?  No - Patient declined No - Patient declined No - Patient declined    Copy of Healthcare Power of Attorney in Chart? No - copy requested  No - copy requested No - copy requested    Would patient like information  on creating a medical advance directive?      No - Patient declined    Current Medications (verified) Outpatient Encounter Medications as of 09/07/2023  Medication Sig   acetaminophen (TYLENOL) 500 MG tablet Take 500 mg by mouth every 6 (six) hours as needed.   amLODipine (NORVASC) 5 MG tablet Take 1 tablet (5 mg total) by mouth daily.   aspirin 81 MG EC tablet Take 1 by mouth daily   atorvastatin (LIPITOR) 80 MG tablet TAKE ONE TABLET BY MOUTH DAILY AT 6:00PM   Azelaic Acid 15 % cream Apply topically 1 day or 1 dose. After skin is thoroughly washed and patted dry, gently but thoroughly massage a thin film of azelaic acid cream into the affected area twice daily, in the morning and evening.   b complex vitamins tablet Take 1 tablet by mouth daily.   Cholecalciferol (VITAMIN D) 50 MCG (2000 UT) tablet Take 2,000 Units by mouth every other day.   diclofenac Sodium (VOLTAREN) 1 % GEL Apply 1 application topically 4 (four) times daily.   dicyclomine (BENTYL) 10 MG capsule TAKE ONE CAPSULE BY MOUTH EVERY 6 TO 8 HOURS AS NEEDED FOR CRAMPING AND SPASMS   DULoxetine (CYMBALTA) 20 MG capsule Take 1 capsule (20 mg total) by mouth daily.   fluticasone (FLONASE) 50 MCG/ACT nasal spray Place 1-2 sprays into both nostrils daily.    furosemide (LASIX) 20 MG tablet TAKE 1/2 TO 1 TABLET BY MOUTH DAILY AS NEEDED FOR EDEMA   hydroxychloroquine (PLAQUENIL) 200 MG tablet Take 200 mg  by mouth 2 (two) times daily. Take 1 tablet a day   influenza vaccine adjuvanted (FLUAD) 0.5 ML injection Inject into the muscle.   LORazepam (ATIVAN) 1 MG tablet TAKE 1/2 TO 1 TABLET BY MOUTH TWO TIMES A DAY AS NEEDED FOR ANXIETY   losartan (COZAAR) 100 MG tablet Take 1 tablet (100 mg total) by mouth daily.   omeprazole (PRILOSEC) 10 MG capsule    Tdap (BOOSTRIX) 5-2.5-18.5 LF-MCG/0.5 injection Inject 0.5 mLs into the muscle.   triamcinolone cream (KENALOG) 0.1 %    No facility-administered encounter medications on file as of  09/07/2023.    Allergies (verified) Ceftriaxone sodium, Levaquin [levofloxacin], Rocephin [ceftriaxone], Biaxin [clarithromycin], Sulfonamide derivatives, Aspirin, Benadryl [diphenhydramine], Codeine, Dairy aid [tilactase], Gluten meal, Levaquin [levofloxacin in d5w], Molnupiravir, Pneumovax 23 [pneumococcal vac polyvalent], Rocephin [ceftriaxone sodium in dextrose], and Simvastatin   History: Past Medical History:  Diagnosis Date   Abnormal Pap smear of cervix    Allergy    Atrophic vaginitis    Bulging disc 10/2012   CERVICAL   Bursitis of hip    Cataract    mild starting of    Endometriosis    GERD (gastroesophageal reflux disease)    Hyperlipidemia    Hypertension    IBS (irritable bowel syndrome)    Past Surgical History:  Procedure Laterality Date   ABDOMINAL HYSTERECTOMY  11/1982   AUB & endometriosis, ovaries remain   APPENDECTOMY  1/72   BREAST LUMPECTOMY WITH RADIOACTIVE SEED LOCALIZATION Right 04/29/2021   Procedure: RIGHT BREAST LUMPECTOMY WITH RADIOACTIVE SEED LOCALIZATION;  Surgeon: Griselda Miner, MD;  Location: [facility];  Service: General;  Laterality: Right;   BUNIONECTOMY Left 04/2004   left foot   CATARACT EXTRACTION     CERVIX LESION DESTRUCTION     COLONOSCOPY     CRYOTHERAPY     for abnormal pap smear   eyelid surgery Bilateral 2018   HYSTEROTOMY  5/72   fetal demise, also BTL   LAPAROSCOPIC CHOLECYSTECTOMY  02/2005   POLYPECTOMY     TMJ ARTHROPLASTY  6/94   secondary to hockey injury 15 years earlier   TUBAL LIGATION Bilateral 5/72   UPPER GASTROINTESTINAL ENDOSCOPY     Family History  Problem Relation Age of Onset   Diverticulitis Mother    Breast cancer Mother 36   Hypertension Mother    Heart disease Mother    Osteoarthritis Mother    CVA Mother    Hypercholesterolemia Mother    Colon polyps Mother    Heart disease Brother    Hypertension Brother    Lung cancer Brother 29       lung cancer, MI, ETOH   Heart disease  Sister    Hypertension Sister    Osteoarthritis Sister    Hypertension Father    Hypertension Maternal Uncle    Heart disease Maternal Uncle    Hypertension Maternal Grandfather    Heart disease Maternal Grandfather    Colon cancer Neg Hx    Rectal cancer Neg Hx    Stomach cancer Neg Hx    Social History   Socioeconomic History   Marital status: Married    Spouse name: Marcy Salvo   Number of children: 2   Years of education: Not on file   Highest education level: Bachelor's degree (e.g., BA, AB, BS)  Occupational History   Not on file  Tobacco Use   Smoking status: Never   Smokeless tobacco: Never  Vaping Use   Vaping status:  Never Used  Substance and Sexual Activity   Alcohol use: Not Currently   Drug use: No   Sexual activity: Yes    Birth control/protection: Surgical    Comment: hysterectomy   Other Topics Concern   Not on file  Social History Narrative   Lives with husband at home.  Retired.  Education BS degree.  2-3 cups coffee daily.     Social Drivers of Corporate investment banker Strain: Low Risk  (12/14/2022)   Overall Financial Resource Strain (CARDIA)    Difficulty of Paying Living Expenses: Not hard at all  Food Insecurity: No Food Insecurity (12/14/2022)   Hunger Vital Sign    Worried About Running Out of Food in the Last Year: Never true    Ran Out of Food in the Last Year: Never true  Transportation Needs: No Transportation Needs (12/14/2022)   PRAPARE - Administrator, Civil Service (Medical): No    Lack of Transportation (Non-Medical): No  Physical Activity: Sufficiently Active (12/14/2022)   Exercise Vital Sign    Days of Exercise per Week: 5 days    Minutes of Exercise per Session: 30 min  Stress: Stress Concern Present (12/14/2022)   [employer] of Occupational Health - Occupational Stress Questionnaire    Feeling of Stress : To some extent  Social Connections: Socially Integrated (12/14/2022)   Social Connection and Isolation  Panel [NHANES]    Frequency of Communication with Friends and Family: More than three times a week    Frequency of Social Gatherings with Friends and Family: Once a week    Attends Religious Services: More than 4 times per year    Active Member of [employer] or Organizations: Yes    Attends Engineer, structural: More than 4 times per year    Marital Status: Married    Tobacco Counseling Counseling given: Not Answered   Clinical Intake:  Pre-visit preparation completed: Yes  Pain : No/denies pain     BMI - recorded: 32.53 Nutritional Status: BMI > 30  Obese Nutritional Risks: None Diabetes: No  How often do you need to have someone help you when you read instructions, pamphlets, or other written materials from your doctor or pharmacy?: 1 - Never  Interpreter Needed?: No  Information entered by :: Chyanne Kohut, RMA   Activities of Daily Living    09/03/2023    8:49 AM  In your present state of health, do you have any difficulty performing the following activities:  Hearing? 0  Vision? 0  Difficulty concentrating or making decisions? 0  Walking or climbing stairs? 0  Dressing or bathing? 0  Doing errands, shopping? 0  Preparing Food and eating ? N  Using the Toilet? N  In the past six months, have you accidently leaked urine? N  Do you have problems with loss of bowel control? N  Managing your Medications? N  Managing your Finances? N  Housekeeping or managing your Housekeeping? N    Patient Care Team: Plotnikov, Georgina Quint, MD as PCP - General (Internal Medicine) Lennette Bihari, MD as PCP - Cardiology (Cardiology) Zenovia Jordan, MD as Consulting Physician (Rheumatology) Eugenia Mcalpine, MD (Inactive) as Consulting Physician (Orthopedic Surgery) Antony Contras, MD as Consulting Physician (Ophthalmology)  Indicate any recent Medical Services you may have received from other than [facility] providers in the past year (date may be approximate).     Assessment:    This is a routine wellness examination for Jennifer Davenport.  Hearing/Vision screen  Hearing Screening - Comments:: Denies hearing difficulties     Goals Addressed               This Visit's Progress     Patient Stated (pt-stated)   On track     Patient states she would like to get back into hiking.       Depression Screen    09/07/2023    8:30 AM 12/18/2022    8:44 AM 06/19/2022    2:19 PM 12/14/2021    8:53 AM 03/14/2021    8:52 AM 03/06/2019    9:38 AM 03/04/2018    3:59 PM  PHQ 2/9 Scores  PHQ - 2 Score 0 0 0 0 0 0 0  PHQ- 9 Score 3    0      Fall Risk    09/03/2023    8:49 AM 12/18/2022    8:44 AM 06/19/2022    2:20 PM 06/19/2022    9:51 AM 06/18/2022    3:12 PM  Fall Risk   Falls in the past year? 0  0 0    Number falls in past yr:  0 0 0  0   Injury with Fall?  0 0    Risk for fall due to :  No Fall Risks No Fall Risks    Follow up  Falls evaluation completed Falls evaluation completed       Patient-reported    MEDICARE RISK AT HOME: Medicare Risk at Home Any stairs in or around the home?: (Patient-Rptd) (P) No Home free of loose throw rugs in walkways, pet beds, electrical cords, etc?: (Patient-Rptd) (P) Yes Adequate lighting in your home to reduce risk of falls?: (Patient-Rptd) (P) Yes Life alert?: (Patient-Rptd) (P) No Use of a cane, walker or w/c?: (Patient-Rptd) (P) No Grab bars in the bathroom?: (Patient-Rptd) (P) Yes Shower chair or bench in shower?: (Patient-Rptd) (P) No Elevated toilet seat or a handicapped toilet?: (Patient-Rptd) (P) No  TIMED UP AND GO:  Was the test performed?  No    Cognitive Function:        09/07/2023    8:28 AM 06/19/2022    2:21 PM  6CIT Screen  What Year? 0 points 0 points  What month?  0 points  What time? 0 points 0 points  Count back from 20 0 points 0 points  Months in reverse 0 points 0 points  Repeat phrase 0 points 0 points  Total Score  0 points    Immunizations Immunization History  Administered  Date(s) Administered   DTaP 09/25/2005   Fluad Quad(high Dose 65+) 07/14/2019, 07/10/2020, 06/29/2021   Fluad Trivalent(High Dose 65+) 06/29/2023   Influenza, High Dose Seasonal PF 07/31/2017, 08/02/2018   Influenza,inj,Quad PF,6+ Mos 07/26/2016   PFIZER Comirnaty(Gray Top)Covid-19 Tri-Sucrose Vaccine 01/14/2021   PFIZER(Purple Top)SARS-COV-2 Vaccination 10/15/2019, 11/05/2019, 06/27/2020   Pfizer Covid-19 Vaccine Bivalent Booster 21yrs & up 06/15/2021   Pfizer(Comirnaty)Fall Seasonal Vaccine 12 years and older 07/07/2022, 06/22/2023   Pneumococcal Conjugate-13 11/02/2017   Pneumococcal Polysaccharide-23 03/07/2019   Respiratory Syncytial Virus Vaccine,Recomb Aduvanted(Arexvy) 08/15/2022   Td 09/25/2012   Tdap 01/22/2023   Zoster Recombinant(Shingrix) 07/20/2023    TDAP status: Up to date  Flu Vaccine status: Up to date  Pneumococcal vaccine status: Up to date  Covid-19 vaccine status: Completed vaccines  Qualifies for Shingles Vaccine? Yes   Zostavax completed Yes   Shingrix Completed?: No.    Education has been provided regarding the importance of this vaccine. Patient has been  advised to call insurance company to determine out of pocket expense if they have not yet received this vaccine. Advised may also receive vaccine at local pharmacy or Health Dept. Verbalized acceptance and understanding.  Screening Tests Health Maintenance  Topic Date Due   Diabetic kidney evaluation - Urine ACR  Never done   FOOT EXAM  09/12/2023 (Originally 06/12/1961)   HEMOGLOBIN A1C  09/12/2023 (Originally 12/26/2021)   COVID-19 Vaccine (8 - 2024-25 season) 09/25/2023 (Originally 08/17/2023)   Zoster Vaccines- Shingrix (2 of 2) 09/14/2023   Diabetic kidney evaluation - eGFR measurement  12/18/2023   OPHTHALMOLOGY EXAM  06/05/2024   MAMMOGRAM  06/06/2024   Medicare Annual Wellness (AWV)  09/06/2024   Colonoscopy  05/08/2026   DTaP/Tdap/Td (4 - Td or Tdap) 01/21/2033   Pneumonia Vaccine 65+ Years  old  Completed   INFLUENZA VACCINE  Completed   DEXA SCAN  Completed   Hepatitis C Screening  Completed   HPV VACCINES  Aged Out    Health Maintenance  Health Maintenance Due  Topic Date Due   Diabetic kidney evaluation - Urine ACR  Never done    Colorectal cancer screening: Type of screening: Colonoscopy. Completed 09/08/2023. Repeat every 3 years  Mammogram status: Completed 05/2023. Repeat every year   Lung Cancer Screening: (Low Dose CT Chest recommended if Age 81-80 years, 20 pack-year currently smoking OR have quit w/in 15years.) does not qualify.   Lung Cancer Screening Referral: N/A  Additional Screening:  Hepatitis C Screening: does qualify; Completed 02/15/2016  Vision Screening: Recommended annual ophthalmology exams for early detection of glaucoma and other disorders of the eye. Is the patient up to date with their annual eye exam?  Yes  Who is the provider or what is the name of the office in which the patient attends annual eye exams? [facility] If pt is not established with a provider, would they like to be referred to a provider to establish care? No .   Dental Screening: Recommended annual dental exams for proper oral hygiene  Diabetic Foot Exam: Diabetic Foot Exam: Overdue, Pt has been advised about the importance in completing this exam. Pt is scheduled for diabetic foot exam on 21/18/2024.  Community Resource Referral / Chronic Care Management: CRR required this visit?  No   CCM required this visit?  No     Plan:     I have personally reviewed and noted the following in the patient's chart:   Medical and social history Use of alcohol, tobacco or illicit drugs  Current medications and supplements including opioid prescriptions. Patient is not currently taking opioid prescriptions. Functional ability and status Nutritional status Physical activity Advanced directives List of other physicians Hospitalizations, surgeries, and ER visits in previous 12  months Vitals Screenings to include cognitive, depression, and falls Referrals and appointments  In addition, I have reviewed and discussed with patient certain preventive protocols, quality metrics, and best practice recommendations. A written personalized care plan for preventive services as well as general preventive health recommendations were provided to patient.     Moshe Wenger L Bristyn Kulesza, CMA   09/07/2023   After Visit Summary: (MyChart) Due to this being a telephonic visit, the after visit summary with patients personalized plan was offered to patient via MyChart   Nurse Notes: Patient is due for her 2nd Shingles vaccine.  She is due for a foot exam, an A1C, and a UACR, which order has been placed to get done during her up coming visit with Dr. Posey Rea next  week.  Patient stated that she has had an eye exam recently with Dr. Randon Goldsmith, however I only see the exam from last year in patients chart.  I will send a request for record to Dr. Randon Goldsmith office today.  She also stated that she was up to date on Covid vaccine, however patient does not pull up in [place].  She had no other concerns to address today.

## 2023-09-12 ENCOUNTER — Ambulatory Visit: Payer: Medicare Other | Admitting: Internal Medicine

## 2023-09-12 ENCOUNTER — Encounter: Payer: Self-pay | Admitting: Internal Medicine

## 2023-09-12 VITALS — BP 110/70 | HR 76 | Temp 98.6°F | Ht 61.5 in | Wt 177.0 lb

## 2023-09-12 DIAGNOSIS — K219 Gastro-esophageal reflux disease without esophagitis: Secondary | ICD-10-CM

## 2023-09-12 DIAGNOSIS — E785 Hyperlipidemia, unspecified: Secondary | ICD-10-CM | POA: Diagnosis not present

## 2023-09-12 DIAGNOSIS — I10 Essential (primary) hypertension: Secondary | ICD-10-CM | POA: Diagnosis not present

## 2023-09-12 DIAGNOSIS — K9041 Non-celiac gluten sensitivity: Secondary | ICD-10-CM

## 2023-09-12 DIAGNOSIS — F419 Anxiety disorder, unspecified: Secondary | ICD-10-CM

## 2023-09-12 LAB — CBC WITH DIFFERENTIAL/PLATELET
Basophils Absolute: 0 10*3/uL (ref 0.0–0.1)
Basophils Relative: 0.8 % (ref 0.0–3.0)
Eosinophils Absolute: 0.2 10*3/uL (ref 0.0–0.7)
Eosinophils Relative: 3.4 % (ref 0.0–5.0)
HCT: 37.4 % (ref 36.0–46.0)
Hemoglobin: 12.2 g/dL (ref 12.0–15.0)
Lymphocytes Relative: 39.6 % (ref 12.0–46.0)
Lymphs Abs: 2.2 10*3/uL (ref 0.7–4.0)
MCHC: 32.7 g/dL (ref 30.0–36.0)
MCV: 87.2 fL (ref 78.0–100.0)
Monocytes Absolute: 0.6 10*3/uL (ref 0.1–1.0)
Monocytes Relative: 10.1 % (ref 3.0–12.0)
Neutro Abs: 2.6 10*3/uL (ref 1.4–7.7)
Neutrophils Relative %: 46.1 % (ref 43.0–77.0)
Platelets: 265 10*3/uL (ref 150.0–400.0)
RBC: 4.29 Mil/uL (ref 3.87–5.11)
RDW: 13.7 % (ref 11.5–15.5)
WBC: 5.7 10*3/uL (ref 4.0–10.5)

## 2023-09-12 LAB — COMPREHENSIVE METABOLIC PANEL
ALT: 19 U/L (ref 0–35)
AST: 19 U/L (ref 0–37)
Albumin: 4.6 g/dL (ref 3.5–5.2)
Alkaline Phosphatase: 65 U/L (ref 39–117)
BUN: 12 mg/dL (ref 6–23)
CO2: 28 meq/L (ref 19–32)
Calcium: 9.5 mg/dL (ref 8.4–10.5)
Chloride: 104 meq/L (ref 96–112)
Creatinine, Ser: 0.64 mg/dL (ref 0.40–1.20)
GFR: 89.02 mL/min (ref >60.00)
Glucose, Bld: 92 mg/dL (ref 70–99)
Potassium: 4.2 meq/L (ref 3.5–5.1)
Sodium: 139 meq/L (ref 135–145)
Total Bilirubin: 0.5 mg/dL (ref 0.2–1.2)
Total Protein: 7.4 g/dL (ref 6.0–8.3)

## 2023-09-12 LAB — TSH: TSH: 1.36 u[IU]/mL (ref 0.35–5.50)

## 2023-09-12 LAB — LIPID PANEL
Cholesterol: 131 mg/dL (ref 0–200)
HDL: 81.4 mg/dL (ref >39.00)
LDL Cholesterol: 36 mg/dL (ref 0–99)
NonHDL: 49.71
Total CHOL/HDL Ratio: 2
Triglycerides: 70 mg/dL (ref 0.0–149.0)
VLDL: 14 mg/dL (ref 0.0–40.0)

## 2023-09-12 LAB — URINALYSIS, ROUTINE W REFLEX MICROSCOPIC
Bilirubin Urine: NEGATIVE
Hgb urine dipstick: NEGATIVE
Ketones, ur: NEGATIVE
Nitrite: NEGATIVE
Specific Gravity, Urine: 1.01 (ref 1.000–1.030)
Total Protein, Urine: NEGATIVE
Urine Glucose: NEGATIVE
Urobilinogen, UA: 0.2 (ref 0.0–1.0)
pH: 7 (ref 5.0–8.0)

## 2023-09-12 MED ORDER — LORAZEPAM 1 MG PO TABS: ORAL_TABLET | ORAL | 3 refills | Status: DC

## 2023-09-12 MED ORDER — SIMPONI 100 MG/ML ~~LOC~~ SOAJ: SUBCUTANEOUS | Status: DC

## 2023-09-12 NOTE — Assessment & Plan Note (Signed)
Cont on Amlodipine, Losartan  

## 2023-09-12 NOTE — Assessment & Plan Note (Signed)
On nexium

## 2023-09-12 NOTE — Assessment & Plan Note (Signed)
On gluten free, milk free and lectin free diet 

## 2023-09-12 NOTE — Progress Notes (Signed)
Subjective:  Patient ID: Jennifer Davenport, female    DOB: 04-02-1951  Age: 72 y.o. MRN: [id number]  CC: Medical Management of Chronic Issues (6 MNTH F/U)   HPI Jennifer Davenport presents for anxiety, HTN, dyslipidemia  Outpatient Medications Prior to Visit  Medication Sig Dispense Refill   acetaminophen (TYLENOL) 500 MG tablet Take 500 mg by mouth every 6 (six) hours as needed.     amLODipine (NORVASC) 5 MG tablet Take 1 tablet (5 mg total) by mouth daily. 90 tablet 3   aspirin 81 MG EC tablet Take 1 by mouth daily     atorvastatin (LIPITOR) 80 MG tablet TAKE ONE TABLET BY MOUTH DAILY AT 6:00PM 90 tablet 3   Azelaic Acid 15 % cream Apply topically 1 day or 1 dose. After skin is thoroughly washed and patted dry, gently but thoroughly massage a thin film of azelaic acid cream into the affected area twice daily, in the morning and evening.     b complex vitamins tablet Take 1 tablet by mouth daily.     Cholecalciferol (VITAMIN D) 50 MCG (2000 UT) tablet Take 2,000 Units by mouth every other day.     diclofenac Sodium (VOLTAREN) 1 % GEL Apply 1 application topically 4 (four) times daily. 100 g 3   dicyclomine (BENTYL) 10 MG capsule TAKE ONE CAPSULE BY MOUTH EVERY 6 TO 8 HOURS AS NEEDED FOR CRAMPING AND SPASMS 90 capsule 2   DULoxetine (CYMBALTA) 20 MG capsule Take 1 capsule (20 mg total) by mouth daily. 90 capsule 3   fluticasone (FLONASE) 50 MCG/ACT nasal spray Place 1-2 sprays into both nostrils daily.      furosemide (LASIX) 20 MG tablet TAKE 1/2 TO 1 TABLET BY MOUTH DAILY AS NEEDED FOR EDEMA 90 tablet 0   hydroxychloroquine (PLAQUENIL) 200 MG tablet Take 200 mg by mouth 2 (two) times daily. Take 1 tablet a day     influenza vaccine adjuvanted (FLUAD) 0.5 ML injection Inject into the muscle. 0.5 mL 0   losartan (COZAAR) 100 MG tablet Take 1 tablet (100 mg total) by mouth daily. 90 tablet 3   omeprazole (PRILOSEC) 10 MG capsule      Tdap (BOOSTRIX) 5-2.5-18.5 LF-MCG/0.5 injection  Inject 0.5 mLs into the muscle. 0.5 mL 0   triamcinolone cream (KENALOG) 0.1 %      LORazepam (ATIVAN) 1 MG tablet TAKE 1/2 TO 1 TABLET BY MOUTH TWO TIMES A DAY AS NEEDED FOR ANXIETY 30 tablet 3   No facility-administered medications prior to visit.    ROS: Review of Systems  Constitutional:  Negative for activity change, appetite change, chills, fatigue and unexpected weight change.  HENT:  Negative for congestion, mouth sores and sinus pressure.   Eyes:  Negative for visual disturbance.  Respiratory:  Negative for cough and chest tightness.   Gastrointestinal:  Negative for abdominal pain and nausea.  Genitourinary:  Negative for difficulty urinating, frequency and vaginal pain.  Musculoskeletal:  Negative for back pain and gait problem.  Skin:  Negative for pallor and rash.  Neurological:  Negative for dizziness, tremors, weakness, numbness and headaches.  Psychiatric/Behavioral:  Negative for confusion and sleep disturbance.     Objective:  BP 110/70 (BP Location: Left Arm, Patient Position: Sitting, Cuff Size: Normal)   Pulse 76   Temp 98.6 F (37 C) (Oral)   Ht 5' 1.5" (1.562 m)   Wt 177 lb (80.3 kg)   LMP 11/24/1982   SpO2 97%   BMI 32.90  kg/m   BP Readings from Last 3 Encounters:  09/12/23 110/70  05/09/23 (!) 162/76  03/20/23 130/84    Wt Readings from Last 3 Encounters:  09/12/23 177 lb (80.3 kg)  09/07/23 175 lb (79.4 kg)  05/09/23 175 lb (79.4 kg)    Physical Exam Constitutional:      General: She is not in acute distress.    Appearance: She is well-developed.  HENT:     Head: Normocephalic.     Right Ear: External ear normal.     Left Ear: External ear normal.     Nose: Nose normal.  Eyes:     General:        Right eye: No discharge.        Left eye: No discharge.     Conjunctiva/sclera: Conjunctivae normal.     Pupils: Pupils are equal, round, and reactive to light.  Neck:     Thyroid: No thyromegaly.     Vascular: No JVD.     Trachea: No  tracheal deviation.  Cardiovascular:     Rate and Rhythm: Normal rate and regular rhythm.     Heart sounds: Normal heart sounds.  Pulmonary:     Effort: No respiratory distress.     Breath sounds: No stridor. No wheezing.  Abdominal:     General: Bowel sounds are normal. There is no distension.     Palpations: Abdomen is soft. There is no mass.     Tenderness: There is no abdominal tenderness. There is no guarding or rebound.  Musculoskeletal:        General: No tenderness.     Cervical back: Normal range of motion and neck supple. No rigidity.  Lymphadenopathy:     Cervical: No cervical adenopathy.  Skin:    Findings: No erythema or rash.  Neurological:     Cranial Nerves: No cranial nerve deficit.     Motor: No abnormal muscle tone.     Coordination: Coordination normal.     Deep Tendon Reflexes: Reflexes normal.  Psychiatric:        Behavior: Behavior normal.        Thought Content: Thought content normal.        Judgment: Judgment normal.     Lab Results  Component Value Date   WBC 5.9 12/18/2022   HGB 13.1 12/18/2022   HCT 39.7 12/18/2022   PLT 288.0 12/18/2022   GLUCOSE 92 12/18/2022   CHOL 154 12/18/2022   TRIG 61.0 12/18/2022   HDL 88.40 12/18/2022   LDLCALC 53 12/18/2022   ALT 18 12/18/2022   AST 21 12/18/2022   NA 138 12/18/2022   K 4.3 12/18/2022   CL 102 12/18/2022   CREATININE 0.66 12/18/2022   BUN 14 12/18/2022   CO2 29 12/18/2022   TSH 1.70 12/18/2022   INR 1.0 06/27/2021   HGBA1C 5.9 06/27/2021    No results found.  Assessment & Plan:   Problem List Items Addressed This Visit     Dyslipidemia   Relevant Orders   TSH   Lipid panel   Essential hypertension - Primary   Cont on Amlodipine, Losartan      Relevant Orders   TSH   Urinalysis   CBC with Differential/Platelet   Lipid panel   Comprehensive metabolic panel   GERD   On nexium      Gluten intolerance   On gluten free, milk free and lectin free diet      Anxiety  disorder  Lorazepam prn  Potential benefits of a long term benzodiazepines  use as well as potential risks  and complications were explained to the patient and were aknowledged.       Relevant Medications   LORazepam (ATIVAN) 1 MG tablet      Meds ordered this encounter  Medications   LORazepam (ATIVAN) 1 MG tablet    Sig: TAKE 1/2 TO 1 TABLET BY MOUTH TWO TIMES A DAY AS NEEDED FOR ANXIETY    Dispense:  30 tablet    Refill:  3   Golimumab (SIMPONI) 100 MG/ML SOAJ    Sig: Q 2 months - Dr Nickola Major      Follow-up: Return in about 4 months (around 01/11/2024) for a follow-up visit.  Sonda Primes, MD

## 2023-09-12 NOTE — Assessment & Plan Note (Signed)
Lorazepam prn  Potential benefits of a long term benzodiazepines  use as well as potential risks  and complications were explained to the patient and were aknowledged.  

## 2023-09-15 LAB — GENECONNECT MOLECULAR SCREEN: Genetic Analysis Overall Interpretation: NEGATIVE

## 2023-09-17 ENCOUNTER — Encounter: Payer: Self-pay | Admitting: Internal Medicine

## 2023-09-17 NOTE — Telephone Encounter (Signed)
 Care team updated and letter sent for eye exam notes.

## 2023-09-24 DIAGNOSIS — Z79899 Other long term (current) drug therapy: Secondary | ICD-10-CM | POA: Diagnosis not present

## 2023-09-24 DIAGNOSIS — M0609 Rheumatoid arthritis without rheumatoid factor, multiple sites: Secondary | ICD-10-CM | POA: Diagnosis not present

## 2023-10-08 ENCOUNTER — Other Ambulatory Visit (HOSPITAL_BASED_OUTPATIENT_CLINIC_OR_DEPARTMENT_OTHER): Payer: Self-pay

## 2023-10-08 MED ORDER — SHINGRIX 50 MCG/0.5ML IM SUSR
0.5000 mL | Freq: Once | INTRAMUSCULAR | 0 refills | Status: AC
  Filled 2023-10-08: qty 0.5, 1d supply, fill #0

## 2023-10-15 DIAGNOSIS — Z96652 Presence of left artificial knee joint: Secondary | ICD-10-CM | POA: Diagnosis not present

## 2023-11-08 DIAGNOSIS — H26492 Other secondary cataract, left eye: Secondary | ICD-10-CM | POA: Diagnosis not present

## 2023-11-08 DIAGNOSIS — H26491 Other secondary cataract, right eye: Secondary | ICD-10-CM | POA: Diagnosis not present

## 2023-11-19 DIAGNOSIS — M0609 Rheumatoid arthritis without rheumatoid factor, multiple sites: Secondary | ICD-10-CM | POA: Diagnosis not present

## 2023-11-20 DIAGNOSIS — Z79899 Other long term (current) drug therapy: Secondary | ICD-10-CM | POA: Diagnosis not present

## 2023-11-20 DIAGNOSIS — E669 Obesity, unspecified: Secondary | ICD-10-CM | POA: Diagnosis not present

## 2023-11-20 DIAGNOSIS — Z6835 Body mass index (BMI) 35.0-35.9, adult: Secondary | ICD-10-CM | POA: Diagnosis not present

## 2023-11-20 DIAGNOSIS — M1991 Primary osteoarthritis, unspecified site: Secondary | ICD-10-CM | POA: Diagnosis not present

## 2023-11-20 DIAGNOSIS — M0609 Rheumatoid arthritis without rheumatoid factor, multiple sites: Secondary | ICD-10-CM | POA: Diagnosis not present

## 2023-11-29 DIAGNOSIS — H04123 Dry eye syndrome of bilateral lacrimal glands: Secondary | ICD-10-CM | POA: Diagnosis not present

## 2023-11-29 DIAGNOSIS — H43813 Vitreous degeneration, bilateral: Secondary | ICD-10-CM | POA: Diagnosis not present

## 2023-12-20 ENCOUNTER — Telehealth (INDEPENDENT_AMBULATORY_CARE_PROVIDER_SITE_OTHER): Admitting: Internal Medicine

## 2023-12-20 DIAGNOSIS — R002 Palpitations: Secondary | ICD-10-CM | POA: Diagnosis not present

## 2023-12-20 DIAGNOSIS — I10 Essential (primary) hypertension: Secondary | ICD-10-CM

## 2023-12-20 DIAGNOSIS — K9041 Non-celiac gluten sensitivity: Secondary | ICD-10-CM | POA: Diagnosis not present

## 2023-12-20 DIAGNOSIS — F419 Anxiety disorder, unspecified: Secondary | ICD-10-CM | POA: Diagnosis not present

## 2023-12-20 MED ORDER — DULOXETINE HCL 30 MG PO CPEP: 30.0000 mg | ORAL_CAPSULE | Freq: Every day | ORAL | 5 refills | Status: DC

## 2023-12-20 NOTE — Progress Notes (Signed)
 Virtual Visit via Video Note  I connected with Jennifer Davenport on 12/24/23 at 11:20 AM EDT by a video enabled telemedicine application and verified that I am speaking with the correct person using two identifiers.   I discussed the limitations of evaluation and management by telemedicine and the availability of in person appointments. The patient expressed understanding and agreed to proceed.  I was located at our [facility] office. The patient was at home. There was no one else present in the visit.  No chief complaint on file.    History of Present Illness:  C/o anxiety, palpitations Follow-up on hypertension, gluten allergy  Review of Systems  Constitutional:  Negative for weight loss.  HENT:  Negative for congestion.   Respiratory:  Negative for cough.   Cardiovascular:  Positive for palpitations. Negative for chest pain and orthopnea.  Psychiatric/Behavioral:  Positive for depression. Negative for memory loss and suicidal ideas. The patient is nervous/anxious. The patient does not have insomnia.      Observations/Objective: The patient appears to be in no acute distress  Assessment and Plan:  Problem List Items Addressed This Visit     Essential hypertension - Primary   Cont on Amlodipine, Losartan      Gluten intolerance   Chronic On gluten free, milk free and lectin free diet      Anxiety disorder   Worse.  Will increase Cymbalta to 30 mg daily      Relevant Medications   DULoxetine (CYMBALTA) 30 MG capsule   Palpitations   Will treat depression -increase Cymbalta to 30 mg daily.  Lorazepam as needed        Meds ordered this encounter  Medications   DULoxetine (CYMBALTA) 30 MG capsule    Sig: Take 1 capsule (30 mg total) by mouth daily.    Dispense:  30 capsule    Refill:  5     Follow Up Instructions:    I discussed the assessment and treatment plan with the patient. The patient was provided an opportunity to ask questions and all were  answered. The patient agreed with the plan and demonstrated an understanding of the instructions.   The patient was advised to call back or seek an in-person evaluation if the symptoms worsen or if the condition fails to improve as anticipated.  I provided face-to-face time during this encounter. We were at different locations.   Sonda Primes, MD

## 2023-12-24 ENCOUNTER — Encounter: Payer: Self-pay | Admitting: Internal Medicine

## 2023-12-24 DIAGNOSIS — R002 Palpitations: Secondary | ICD-10-CM | POA: Insufficient documentation

## 2023-12-24 NOTE — Assessment & Plan Note (Signed)
 Will treat depression -increase Cymbalta to 30 mg daily.  Lorazepam as needed

## 2023-12-24 NOTE — Assessment & Plan Note (Signed)
Cont on Amlodipine, Losartan  

## 2023-12-24 NOTE — Assessment & Plan Note (Signed)
Chronic ?On gluten free, milk free and lectin free diet ?

## 2023-12-24 NOTE — Assessment & Plan Note (Signed)
 Worse.  Will increase Cymbalta to 30 mg daily

## 2024-01-14 DIAGNOSIS — M0609 Rheumatoid arthritis without rheumatoid factor, multiple sites: Secondary | ICD-10-CM | POA: Diagnosis not present

## 2024-02-11 DIAGNOSIS — Z79899 Other long term (current) drug therapy: Secondary | ICD-10-CM | POA: Diagnosis not present

## 2024-02-11 DIAGNOSIS — M0609 Rheumatoid arthritis without rheumatoid factor, multiple sites: Secondary | ICD-10-CM | POA: Diagnosis not present

## 2024-02-14 ENCOUNTER — Other Ambulatory Visit: Payer: Self-pay | Admitting: Internal Medicine

## 2024-02-19 DIAGNOSIS — Z96652 Presence of left artificial knee joint: Secondary | ICD-10-CM | POA: Diagnosis not present

## 2024-02-19 DIAGNOSIS — M310 Hypersensitivity angiitis: Secondary | ICD-10-CM | POA: Diagnosis not present

## 2024-02-19 DIAGNOSIS — T8484XA Pain due to internal orthopedic prosthetic devices, implants and grafts, initial encounter: Secondary | ICD-10-CM | POA: Diagnosis not present

## 2024-02-19 DIAGNOSIS — M1991 Primary osteoarthritis, unspecified site: Secondary | ICD-10-CM | POA: Diagnosis not present

## 2024-02-19 DIAGNOSIS — M0609 Rheumatoid arthritis without rheumatoid factor, multiple sites: Secondary | ICD-10-CM | POA: Diagnosis not present

## 2024-02-19 DIAGNOSIS — Z79899 Other long term (current) drug therapy: Secondary | ICD-10-CM | POA: Diagnosis not present

## 2024-02-19 DIAGNOSIS — E669 Obesity, unspecified: Secondary | ICD-10-CM | POA: Diagnosis not present

## 2024-02-19 DIAGNOSIS — Z6834 Body mass index (BMI) 34.0-34.9, adult: Secondary | ICD-10-CM | POA: Diagnosis not present

## 2024-02-22 DIAGNOSIS — Z6834 Body mass index (BMI) 34.0-34.9, adult: Secondary | ICD-10-CM | POA: Diagnosis not present

## 2024-02-22 DIAGNOSIS — Z124 Encounter for screening for malignant neoplasm of cervix: Secondary | ICD-10-CM | POA: Diagnosis not present

## 2024-02-22 DIAGNOSIS — Z1151 Encounter for screening for human papillomavirus (HPV): Secondary | ICD-10-CM | POA: Diagnosis not present

## 2024-03-10 DIAGNOSIS — M0609 Rheumatoid arthritis without rheumatoid factor, multiple sites: Secondary | ICD-10-CM | POA: Diagnosis not present

## 2024-03-12 ENCOUNTER — Encounter: Payer: Self-pay | Admitting: Internal Medicine

## 2024-03-12 ENCOUNTER — Ambulatory Visit (INDEPENDENT_AMBULATORY_CARE_PROVIDER_SITE_OTHER): Payer: Medicare Other | Admitting: Internal Medicine

## 2024-03-12 VITALS — BP 122/80 | HR 79 | Temp 98.4°F | Ht 65.5 in | Wt 176.0 lb

## 2024-03-12 DIAGNOSIS — R252 Cramp and spasm: Secondary | ICD-10-CM | POA: Insufficient documentation

## 2024-03-12 DIAGNOSIS — I10 Essential (primary) hypertension: Secondary | ICD-10-CM

## 2024-03-12 DIAGNOSIS — K219 Gastro-esophageal reflux disease without esophagitis: Secondary | ICD-10-CM | POA: Diagnosis not present

## 2024-03-12 DIAGNOSIS — M545 Low back pain, unspecified: Secondary | ICD-10-CM | POA: Diagnosis not present

## 2024-03-12 DIAGNOSIS — I25119 Atherosclerotic heart disease of native coronary artery with unspecified angina pectoris: Secondary | ICD-10-CM | POA: Diagnosis not present

## 2024-03-12 DIAGNOSIS — G8929 Other chronic pain: Secondary | ICD-10-CM

## 2024-03-12 MED ORDER — DULOXETINE HCL 30 MG PO CPEP: 30.0000 mg | ORAL_CAPSULE | Freq: Every day | ORAL | 3 refills | Status: AC

## 2024-03-12 MED ORDER — LORAZEPAM 1 MG PO TABS: ORAL_TABLET | ORAL | 3 refills | Status: AC

## 2024-03-12 MED ORDER — LOSARTAN POTASSIUM 100 MG PO TABS: 100.0000 mg | ORAL_TABLET | Freq: Every day | ORAL | 3 refills | Status: AC

## 2024-03-12 MED ORDER — AMLODIPINE BESYLATE 5 MG PO TABS: 5.0000 mg | ORAL_TABLET | Freq: Every day | ORAL | 3 refills | Status: AC

## 2024-03-12 MED ORDER — ATORVASTATIN CALCIUM 80 MG PO TABS: ORAL_TABLET | ORAL | 3 refills | Status: AC

## 2024-03-12 NOTE — Assessment & Plan Note (Signed)
Cont on Nexium ?

## 2024-03-12 NOTE — Assessment & Plan Note (Signed)
 Mag tabs Hold Lipitor if bad

## 2024-03-12 NOTE — Assessment & Plan Note (Signed)
On Lipitor, ASA 

## 2024-03-12 NOTE — Progress Notes (Signed)
 Subjective:  Patient ID: Jennifer Davenport, female    DOB: [birth year]  Age: 73 y.o. MRN: [id number]  CC: Medical Management of Chronic Issues (6 mnth f/u)   HPI [name] presents for dyslipidemia, HTN C/o cramps, hand pain   Outpatient Medications Prior to Visit  Medication Sig Dispense Refill   acetaminophen  (TYLENOL ) 500 MG tablet Take 500 mg by mouth every 6 (six) hours as needed.     aspirin 81 MG EC tablet Take 1 by mouth daily     Azelaic Acid 15 % cream Apply topically 1 day or 1 dose. After skin is thoroughly washed and patted dry, gently but thoroughly massage a thin film of azelaic acid cream into the affected area twice daily, in the morning and evening.     b complex vitamins tablet Take 1 tablet by mouth daily.     Cholecalciferol (VITAMIN D) 50 MCG (2000 UT) tablet Take 2,000 Units by mouth every other day.     diclofenac  Sodium (VOLTAREN ) 1 % GEL Apply 1 application topically 4 (four) times daily. 100 g 3   dicyclomine  (BENTYL ) 10 MG capsule TAKE ONE CAPSULE BY MOUTH EVERY 6 TO 8 HOURS AS NEEDED FOR CRAMPING AND SPASMS 90 capsule 2   esomeprazole (NEXIUM) 20 MG capsule      fluticasone (FLONASE) 50 MCG/ACT nasal spray Place 1-2 sprays into both nostrils daily.      furosemide  (LASIX ) 20 MG tablet TAKE 1/2 TO 1 TABLET BY MOUTH DAILY AS NEEDED FOR EDEMA 90 tablet 0   hydroxychloroquine (PLAQUENIL) 200 MG tablet Take 200 mg by mouth 2 (two) times daily. Take 1 tablet a day     influenza vaccine adjuvanted (FLUAD ) 0.5 ML injection Inject into the muscle. 0.5 mL 0   omeprazole (PRILOSEC) 10 MG capsule      Tdap (BOOSTRIX ) 5-2.5-18.5 LF-MCG/0.5 injection Inject 0.5 mLs into the muscle. 0.5 mL 0   triamcinolone  cream (KENALOG ) 0.1 %      amLODipine  (NORVASC ) 5 MG tablet TAKE 1 TABLET BY MOUTH DAILY 90 tablet 3   atorvastatin  (LIPITOR) 80 MG tablet TAKE ONE TABLET BY MOUTH DAILY AT 6:00PM 90 tablet 3   DULoxetine  (CYMBALTA ) 30 MG capsule Take 1 capsule (30 mg  total) by mouth daily. 30 capsule 5   Golimumab  (SIMPONI ) 100 MG/ML SOAJ Q 2 months - Dr Meredith Stalls     LORazepam  (ATIVAN ) 1 MG tablet TAKE 1/2 TO 1 TABLET BY MOUTH TWO TIMES A DAY AS NEEDED FOR ANXIETY 30 tablet 3   losartan  (COZAAR ) 100 MG tablet Take 1 tablet (100 mg total) by mouth daily. 90 tablet 3   No facility-administered medications prior to visit.    ROS: Review of Systems  Constitutional:  Negative for activity change, appetite change, chills, fatigue and unexpected weight change.  HENT:  Negative for congestion, mouth sores and sinus pressure.   Eyes:  Negative for visual disturbance.  Respiratory:  Negative for cough and chest tightness.   Gastrointestinal:  Negative for abdominal pain and nausea.  Genitourinary:  Negative for difficulty urinating, frequency and vaginal pain.  Musculoskeletal:  Negative for back pain and gait problem.  Skin:  Negative for pallor and rash.  Neurological:  Negative for dizziness, tremors, weakness, numbness and headaches.  Psychiatric/Behavioral:  Negative for confusion, sleep disturbance and suicidal ideas. The patient is nervous/anxious.     Objective:  BP 122/80   Pulse 79   Temp 98.4 F (36.9 C) (Oral)   Ht 5'  5.5 (1.664 m)   Wt 176 lb (79.8 kg)   LMP 11/24/1982   SpO2 97%   BMI 28.84 kg/m   BP Readings from Last 3 Encounters:  03/12/24 122/80  09/12/23 110/70  05/09/23 (!) 162/76    Wt Readings from Last 3 Encounters:  03/12/24 176 lb (79.8 kg)  09/12/23 177 lb (80.3 kg)  09/07/23 175 lb (79.4 kg)    Physical Exam Constitutional:      General: She is not in acute distress.    Appearance: She is well-developed. She is obese.  HENT:     Head: Normocephalic.     Right Ear: External ear normal.     Left Ear: External ear normal.     Nose: Nose normal.   Eyes:     General:        Right eye: No discharge.        Left eye: No discharge.     Conjunctiva/sclera: Conjunctivae normal.     Pupils: Pupils are equal,  round, and reactive to light.   Neck:     Thyroid : No thyromegaly.     Vascular: No JVD.     Trachea: No tracheal deviation.   Cardiovascular:     Rate and Rhythm: Normal rate and regular rhythm.     Heart sounds: Normal heart sounds.  Pulmonary:     Effort: No respiratory distress.     Breath sounds: No stridor. No wheezing.  Abdominal:     General: Bowel sounds are normal. There is no distension.     Palpations: Abdomen is soft. There is no mass.     Tenderness: There is no abdominal tenderness. There is no guarding or rebound.   Musculoskeletal:        General: No tenderness.     Cervical back: Normal range of motion and neck supple. No rigidity.  Lymphadenopathy:     Cervical: No cervical adenopathy.   Skin:    Findings: No erythema or rash.   Neurological:     Cranial Nerves: No cranial nerve deficit.     Motor: No abnormal muscle tone.     Coordination: Coordination normal.     Deep Tendon Reflexes: Reflexes normal.   Psychiatric:        Behavior: Behavior normal.        Thought Content: Thought content normal.        Judgment: Judgment normal.     Lab Results  Component Value Date   WBC 5.7 09/12/2023   HGB 12.2 09/12/2023   HCT 37.4 09/12/2023   PLT 265.0 09/12/2023   GLUCOSE 92 09/12/2023   CHOL 131 09/12/2023   TRIG 70.0 09/12/2023   HDL 81.40 09/12/2023   LDLCALC 36 09/12/2023   ALT 19 09/12/2023   AST 19 09/12/2023   NA 139 09/12/2023   K 4.2 09/12/2023   CL 104 09/12/2023   CREATININE 0.64 09/12/2023   BUN 12 09/12/2023   CO2 28 09/12/2023   TSH 1.36 09/12/2023   INR 1.0 06/27/2021   HGBA1C 5.9 06/27/2021    No results found.  Assessment & Plan:   Problem List Items Addressed This Visit     Essential hypertension   Cont on Amlodipine , Losartan       Relevant Medications   losartan  (COZAAR ) 100 MG tablet   amLODipine  (NORVASC ) 5 MG tablet   atorvastatin  (LIPITOR) 80 MG tablet   Other Relevant Orders   TSH   Urinalysis   CBC  with Differential/Platelet  Lipid panel   Comprehensive metabolic panel with GFR   GERD   Cont on Nexium      Relevant Medications   esomeprazole (NEXIUM) 20 MG capsule   LOW BACK PAIN   Had PT      CAD (coronary artery disease)   On Lipitor, ASA      Relevant Medications   losartan  (COZAAR ) 100 MG tablet   amLODipine  (NORVASC ) 5 MG tablet   atorvastatin  (LIPITOR) 80 MG tablet   Other Relevant Orders   TSH   Urinalysis   CBC with Differential/Platelet   Lipid panel   Comprehensive metabolic panel with GFR   Cramp in limb - Primary   Mag tabs Hold Lipitor if bad         Meds ordered this encounter  Medications   DULoxetine  (CYMBALTA ) 30 MG capsule    Sig: Take 1 capsule (30 mg total) by mouth daily.    Dispense:  90 capsule    Refill:  3   losartan  (COZAAR ) 100 MG tablet    Sig: Take 1 tablet (100 mg total) by mouth daily.    Dispense:  90 tablet    Refill:  3   LORazepam  (ATIVAN ) 1 MG tablet    Sig: TAKE 1/2 TO 1 TABLET BY MOUTH TWO TIMES A DAY AS NEEDED FOR ANXIETY    Dispense:  30 tablet    Refill:  3   amLODipine  (NORVASC ) 5 MG tablet    Sig: Take 1 tablet (5 mg total) by mouth daily.    Dispense:  90 tablet    Refill:  3   atorvastatin  (LIPITOR) 80 MG tablet    Sig: TAKE ONE TABLET BY MOUTH DAILY AT 6:00PM    Dispense:  90 tablet    Refill:  3      Follow-up: Return in about 6 months (around 09/11/2024) for Wellness Exam.  Anitra Barn, MD

## 2024-03-12 NOTE — Assessment & Plan Note (Signed)
Cont on Amlodipine, Losartan  

## 2024-03-12 NOTE — Patient Instructions (Signed)

## 2024-03-12 NOTE — Assessment & Plan Note (Signed)
Had PT

## 2024-03-13 ENCOUNTER — Telehealth: Payer: Self-pay | Admitting: Internal Medicine

## 2024-03-13 DIAGNOSIS — G8929 Other chronic pain: Secondary | ICD-10-CM | POA: Diagnosis not present

## 2024-03-13 DIAGNOSIS — M25562 Pain in left knee: Secondary | ICD-10-CM | POA: Diagnosis not present

## 2024-03-13 DIAGNOSIS — Z96652 Presence of left artificial knee joint: Secondary | ICD-10-CM | POA: Diagnosis not present

## 2024-03-13 NOTE — Telephone Encounter (Signed)
 Copied from CRM [id number]. Topic: Clinical - Medical Advice >> Mar 13, 2024  9:30 AM [place] A wrote: Reason for CRM: Patient called wanted nurse to call her regarding the process of Pre Opt Clearance

## 2024-03-13 NOTE — Telephone Encounter (Signed)
 Copied from CRM [id number]. Topic: Clinical - Medical Advice >> Mar 13, 2024  9:30 AM [place] A wrote: Reason for CRM: Patient called wanted nurse to call her regarding the process of Pre Opt Clearance >> Mar 13, 2024  1:56 PM Adonis Hoot wrote: Patient would like to be scheduled for knee surgery on July 7th. However she is needing a pre -op appointment with PCP, and his next available isnt until Juy2nd. And she would need paperwork to be completed before then.She would like to know is there a way she could be fitted in sooner to have her pre op completed?

## 2024-03-17 NOTE — Telephone Encounter (Signed)
 Done. Thx.

## 2024-03-26 ENCOUNTER — Ambulatory Visit (INDEPENDENT_AMBULATORY_CARE_PROVIDER_SITE_OTHER)

## 2024-03-26 ENCOUNTER — Ambulatory Visit: Admitting: Internal Medicine

## 2024-03-26 ENCOUNTER — Encounter: Payer: Self-pay | Admitting: Internal Medicine

## 2024-03-26 VITALS — BP 120/70 | HR 79 | Temp 98.2°F | Ht 65.5 in | Wt 177.0 lb

## 2024-03-26 DIAGNOSIS — R011 Cardiac murmur, unspecified: Secondary | ICD-10-CM | POA: Diagnosis not present

## 2024-03-26 DIAGNOSIS — Z01818 Encounter for other preprocedural examination: Secondary | ICD-10-CM | POA: Diagnosis not present

## 2024-03-26 DIAGNOSIS — I10 Essential (primary) hypertension: Secondary | ICD-10-CM

## 2024-03-26 DIAGNOSIS — T148XXA Other injury of unspecified body region, initial encounter: Secondary | ICD-10-CM | POA: Diagnosis not present

## 2024-03-26 DIAGNOSIS — E785 Hyperlipidemia, unspecified: Secondary | ICD-10-CM | POA: Diagnosis not present

## 2024-03-26 LAB — URINALYSIS
Bilirubin Urine: NEGATIVE
Hgb urine dipstick: NEGATIVE
Ketones, ur: NEGATIVE
Leukocytes,Ua: NEGATIVE
Nitrite: NEGATIVE
Specific Gravity, Urine: 1.01 (ref 1.000–1.030)
Total Protein, Urine: NEGATIVE
Urine Glucose: NEGATIVE
Urobilinogen, UA: 0.2 (ref 0.0–1.0)
pH: 6 (ref 5.0–8.0)

## 2024-03-26 LAB — COMPREHENSIVE METABOLIC PANEL WITH GFR
ALT: 31 U/L (ref 0–35)
AST: 27 U/L (ref 0–37)
Albumin: 4.5 g/dL (ref 3.5–5.2)
Alkaline Phosphatase: 42 U/L (ref 39–117)
BUN: 10 mg/dL (ref 6–23)
CO2: 27 meq/L (ref 19–32)
Calcium: 9.4 mg/dL (ref 8.4–10.5)
Chloride: 104 meq/L (ref 96–112)
Creatinine, Ser: 0.67 mg/dL (ref 0.40–1.20)
GFR: 87.71 mL/min (ref >60.00)
Glucose, Bld: 87 mg/dL (ref 70–99)
Potassium: 3.9 meq/L (ref 3.5–5.1)
Sodium: 139 meq/L (ref 135–145)
Total Bilirubin: 0.5 mg/dL (ref 0.2–1.2)
Total Protein: 6.9 g/dL (ref 6.0–8.3)

## 2024-03-26 LAB — PROTIME-INR
INR: 1 ratio (ref 0.8–1.0)
Prothrombin Time: 11 s (ref 9.6–13.1)

## 2024-03-26 NOTE — Assessment & Plan Note (Signed)
Lost wt Labs 

## 2024-03-26 NOTE — Assessment & Plan Note (Signed)
 Jennifer Davenport should be medically clear for left knee total knee arthroplasty redo by Dr. Dorian under spinal anesthesia assuming that her lab work comes back acceptable.  She can stop and restart her aspirin per your perioperative protocol. ECHO ordered.  Her chest x-ray was ordered.  She will try to lose more weight -intermittent fasting.  Labs ordered.   Thank you,

## 2024-03-26 NOTE — Progress Notes (Signed)
 Subjective:  Patient ID: Jennifer Davenport, female    DOB: 1951/05/03  Age: 73 y.o. MRN: [id number]  CC: Medical Management of Chronic Issues (Pre-op clearance )   HPI Jennifer Davenport presents for pre-op clearance L TKR re-do - Dr Dorian  Prior  L TKR was done by Dr Gerome (R TKR too - doing well)  Hx: Celiac disease, anxiety, depression, HTN  Outpatient Medications Prior to Visit  Medication Sig Dispense Refill   acetaminophen  (TYLENOL ) 500 MG tablet Take 500 mg by mouth every 6 (six) hours as needed.     amLODipine  (NORVASC ) 5 MG tablet Take 1 tablet (5 mg total) by mouth daily. 90 tablet 3   aspirin 81 MG EC tablet Take 1 by mouth daily     atorvastatin  (LIPITOR) 80 MG tablet TAKE ONE TABLET BY MOUTH DAILY AT 6:00PM 90 tablet 3   Azelaic Acid 15 % cream Apply topically 1 day or 1 dose. After skin is thoroughly washed and patted dry, gently but thoroughly massage a thin film of azelaic acid cream into the affected area twice daily, in the morning and evening.     b complex vitamins tablet Take 1 tablet by mouth daily.     Cholecalciferol (VITAMIN D) 50 MCG (2000 UT) tablet Take 2,000 Units by mouth every other day.     Coenzyme Q10 (COQ10) 100 MG CAPS      Dextromethorphan-guaiFENesin  10-200 MG CAPS Take by mouth.     diclofenac  Sodium (VOLTAREN ) 1 % GEL Apply 1 application topically 4 (four) times daily. 100 g 3   dicyclomine  (BENTYL ) 10 MG capsule TAKE ONE CAPSULE BY MOUTH EVERY 6 TO 8 HOURS AS NEEDED FOR CRAMPING AND SPASMS 90 capsule 2   DULoxetine  (CYMBALTA ) 30 MG capsule Take 1 capsule (30 mg total) by mouth daily. 90 capsule 3   esomeprazole (NEXIUM) 20 MG capsule      fluticasone (FLONASE) 50 MCG/ACT nasal spray Place 1-2 sprays into both nostrils daily.      furosemide  (LASIX ) 20 MG tablet TAKE 1/2 TO 1 TABLET BY MOUTH DAILY AS NEEDED FOR EDEMA 90 tablet 0   hydroxychloroquine (PLAQUENIL) 200 MG tablet Take 200 mg by mouth 2 (two) times daily. Take 1 tablet a day      influenza vaccine adjuvanted (FLUAD ) 0.5 ML injection Inject into the muscle. 0.5 mL 0   LORazepam  (ATIVAN ) 1 MG tablet TAKE 1/2 TO 1 TABLET BY MOUTH TWO TIMES A DAY AS NEEDED FOR ANXIETY 30 tablet 3   losartan  (COZAAR ) 100 MG tablet Take 1 tablet (100 mg total) by mouth daily. 90 tablet 3   losartan -hydrochlorothiazide (HYZAAR) 100-12.5 MG tablet Take 1 tablet by mouth daily.     omeprazole (PRILOSEC) 10 MG capsule      simvastatin  (ZOCOR ) 40 MG tablet Take 40 mg by mouth.     Tdap (BOOSTRIX ) 5-2.5-18.5 LF-MCG/0.5 injection Inject 0.5 mLs into the muscle. 0.5 mL 0   Tocilizumab  (ACTEMRA ) 200 MG/10ML SOLN      triamcinolone  cream (KENALOG ) 0.1 %      No facility-administered medications prior to visit.    ROS: Review of Systems  Constitutional:  Negative for activity change, appetite change, chills, fatigue and unexpected weight change.  HENT:  Negative for congestion, mouth sores and sinus pressure.   Eyes:  Negative for visual disturbance.  Respiratory:  Negative for cough and chest tightness.   Gastrointestinal:  Negative for abdominal pain and nausea.  Genitourinary:  Negative for difficulty  urinating, frequency and vaginal pain.  Musculoskeletal:  Positive for arthralgias and gait problem. Negative for back pain.  Skin:  Negative for pallor and rash.  Neurological:  Negative for dizziness, tremors, weakness, numbness and headaches.  Psychiatric/Behavioral:  Negative for confusion and sleep disturbance.     Objective:  BP 120/70   Pulse 79   Temp 98.2 F (36.8 C) (Oral)   Ht 5' 5.5 (1.664 m)   Wt 177 lb (80.3 kg)   LMP 11/24/1982   SpO2 97%   BMI 29.01 kg/m   BP Readings from Last 3 Encounters:  03/26/24 120/70  03/12/24 122/80  09/12/23 110/70    Wt Readings from Last 3 Encounters:  03/26/24 177 lb (80.3 kg)  03/12/24 176 lb (79.8 kg)  09/12/23 177 lb (80.3 kg)    Physical Exam Constitutional:      General: She is not in acute distress.    Appearance: She  is well-developed.  HENT:     Head: Normocephalic.     Right Ear: External ear normal.     Left Ear: External ear normal.     Nose: Nose normal.  Eyes:     General:        Right eye: No discharge.        Left eye: No discharge.     Conjunctiva/sclera: Conjunctivae normal.     Pupils: Pupils are equal, round, and reactive to light.  Neck:     Thyroid : No thyromegaly.     Vascular: No JVD.     Trachea: No tracheal deviation.  Cardiovascular:     Rate and Rhythm: Normal rate and regular rhythm.     Heart sounds: Normal heart sounds.  Pulmonary:     Effort: No respiratory distress.     Breath sounds: No stridor. No wheezing.  Abdominal:     General: Bowel sounds are normal. There is no distension.     Palpations: Abdomen is soft. There is no mass.     Tenderness: There is no abdominal tenderness. There is no guarding or rebound.  Musculoskeletal:        General: No tenderness.     Cervical back: Normal range of motion and neck supple. No rigidity.  Lymphadenopathy:     Cervical: No cervical adenopathy.  Skin:    Findings: No erythema or rash.  Neurological:     Cranial Nerves: No cranial nerve deficit.     Motor: No abnormal muscle tone.     Coordination: Coordination normal.     Deep Tendon Reflexes: Reflexes normal.  Psychiatric:        Behavior: Behavior normal.        Thought Content: Thought content normal.        Judgment: Judgment normal.   Mild murmur  Lab Results  Component Value Date   WBC 5.7 09/12/2023   HGB 12.2 09/12/2023   HCT 37.4 09/12/2023   PLT 265.0 09/12/2023   GLUCOSE 87 03/26/2024   CHOL 131 09/12/2023   TRIG 70.0 09/12/2023   HDL 81.40 09/12/2023   LDLCALC 36 09/12/2023   ALT 31 03/26/2024   AST 27 03/26/2024   NA 139 03/26/2024   K 3.9 03/26/2024   CL 104 03/26/2024   CREATININE 0.67 03/26/2024   BUN 10 03/26/2024   CO2 27 03/26/2024   TSH 1.36 09/12/2023   INR 1.0 03/26/2024   HGBA1C 5.9 06/27/2021    No results  found.  Assessment & Plan:   Problem List Items  Addressed This Visit     Dyslipidemia   Cont on Lipitor      Relevant Medications   simvastatin  (ZOCOR ) 40 MG tablet   Essential hypertension   Cont on Amlodipine , Losartan       Relevant Medications   simvastatin  (ZOCOR ) 40 MG tablet   losartan -hydrochlorothiazide (HYZAAR) 100-12.5 MG tablet   Preop exam for internal medicine - Primary   Pat should be medically clear for left knee total knee arthroplasty redo by Dr. Dorian under spinal anesthesia assuming that her lab work comes back acceptable.  She can stop and restart her aspirin per your perioperative protocol. ECHO ordered.  Her chest x-ray was ordered.  She will try to lose more weight -intermittent fasting.  Labs ordered.   Thank you,      Relevant Orders   Comprehensive metabolic panel with GFR (Completed)   Urinalysis (Completed)   Protime-INR (Completed)   DG Chest 2 View   Heart murmur   Mild - obtain pre-op ECHO      Relevant Orders   ECHOCARDIOGRAM COMPLETE   DG Chest 2 View   Other Visit Diagnoses       Bruising       Relevant Orders   Protime-INR (Completed)         No orders of the defined types were placed in this encounter.     Follow-up: Return in about 6 months (around 09/26/2024) for a follow-up visit.  Marolyn Noel, MD

## 2024-03-26 NOTE — Assessment & Plan Note (Signed)
Cont on Amlodipine, Losartan  

## 2024-03-26 NOTE — Assessment & Plan Note (Signed)
 Mild - obtain pre-op ECHO

## 2024-03-26 NOTE — Assessment & Plan Note (Signed)
 Cont on Lipitor

## 2024-03-27 ENCOUNTER — Ambulatory Visit (HOSPITAL_COMMUNITY)
Admission: RE | Admit: 2024-03-27 | Discharge: 2024-03-27 | Disposition: A | Discharge status: Home / Self Care | Source: Ambulatory Visit | Attending: Internal Medicine | Admitting: Internal Medicine

## 2024-03-27 DIAGNOSIS — R011 Cardiac murmur, unspecified: Secondary | ICD-10-CM

## 2024-03-27 LAB — ECHOCARDIOGRAM COMPLETE
Area-P 1/2: 5.31 cm2
S' Lateral: 2.3 cm

## 2024-03-30 ENCOUNTER — Ambulatory Visit: Payer: Self-pay | Admitting: Internal Medicine

## 2024-04-01 ENCOUNTER — Encounter: Payer: Self-pay | Admitting: Internal Medicine

## 2024-04-03 NOTE — Telephone Encounter (Signed)
 Copied from CRM [id number]. Topic: General - Other >> Apr 03, 2024  3:05 PM Chiquita SQUIBB wrote: Reason for CRM: Channing the surgery scheduler for Dr. Dorian called in stating they still need the patients last office visit note, preop report, and labs. Please fax to [phone]

## 2024-04-04 NOTE — Telephone Encounter (Signed)
 I have re-faxed order again this morning and the original was sent on 04/01/2024.

## 2024-04-09 DIAGNOSIS — M0609 Rheumatoid arthritis without rheumatoid factor, multiple sites: Secondary | ICD-10-CM | POA: Diagnosis not present

## 2024-04-09 DIAGNOSIS — Z111 Encounter for screening for respiratory tuberculosis: Secondary | ICD-10-CM | POA: Diagnosis not present

## 2024-04-09 DIAGNOSIS — R5383 Other fatigue: Secondary | ICD-10-CM | POA: Diagnosis not present

## 2024-04-09 DIAGNOSIS — Z79899 Other long term (current) drug therapy: Secondary | ICD-10-CM | POA: Diagnosis not present

## 2024-05-07 DIAGNOSIS — M0609 Rheumatoid arthritis without rheumatoid factor, multiple sites: Secondary | ICD-10-CM | POA: Diagnosis not present

## 2024-05-20 DIAGNOSIS — M0609 Rheumatoid arthritis without rheumatoid factor, multiple sites: Secondary | ICD-10-CM | POA: Diagnosis not present

## 2024-05-20 DIAGNOSIS — Z79899 Other long term (current) drug therapy: Secondary | ICD-10-CM | POA: Diagnosis not present

## 2024-05-20 DIAGNOSIS — M310 Hypersensitivity angiitis: Secondary | ICD-10-CM | POA: Diagnosis not present

## 2024-05-20 DIAGNOSIS — Z6835 Body mass index (BMI) 35.0-35.9, adult: Secondary | ICD-10-CM | POA: Diagnosis not present

## 2024-05-20 DIAGNOSIS — E669 Obesity, unspecified: Secondary | ICD-10-CM | POA: Diagnosis not present

## 2024-05-20 DIAGNOSIS — M1991 Primary osteoarthritis, unspecified site: Secondary | ICD-10-CM | POA: Diagnosis not present

## 2024-06-03 ENCOUNTER — Encounter: Payer: Self-pay | Admitting: Internal Medicine

## 2024-06-04 DIAGNOSIS — M0609 Rheumatoid arthritis without rheumatoid factor, multiple sites: Secondary | ICD-10-CM | POA: Diagnosis not present

## 2024-06-09 ENCOUNTER — Other Ambulatory Visit (HOSPITAL_BASED_OUTPATIENT_CLINIC_OR_DEPARTMENT_OTHER): Payer: Self-pay

## 2024-06-09 DIAGNOSIS — Z23 Encounter for immunization: Secondary | ICD-10-CM | POA: Diagnosis not present

## 2024-06-09 MED ORDER — COMIRNATY 30 MCG/0.3ML IM SUSY
0.3000 mL | PREFILLED_SYRINGE | Freq: Once | INTRAMUSCULAR | 0 refills | Status: AC
  Filled 2024-06-09: qty 0.3, 1d supply, fill #0

## 2024-06-10 NOTE — Progress Notes (Signed)
 Sent message, via epic in basket, requesting orders in epic from Careers adviser.

## 2024-06-11 ENCOUNTER — Other Ambulatory Visit: Payer: Self-pay | Admitting: Internal Medicine

## 2024-06-11 MED ORDER — COVID-19 MRNA VAC-TRIS(PFIZER) 30 MCG/0.3ML IM SUSY: 0.3000 mL | PREFILLED_SYRINGE | Freq: Once | INTRAMUSCULAR | 0 refills | Status: AC

## 2024-06-16 DIAGNOSIS — H31003 Unspecified chorioretinal scars, bilateral: Secondary | ICD-10-CM | POA: Diagnosis not present

## 2024-06-16 DIAGNOSIS — Z79899 Other long term (current) drug therapy: Secondary | ICD-10-CM | POA: Diagnosis not present

## 2024-06-16 DIAGNOSIS — Z961 Presence of intraocular lens: Secondary | ICD-10-CM | POA: Diagnosis not present

## 2024-06-16 NOTE — Progress Notes (Signed)
 Anesthesia Review:  PCP: Plotnikov LVO 03/26/24 clearance 03/26/24 iin Media Tab  Cardiologist :saw Dt ONEIDA Sor several years for cp turned out to be anxiety  LOV 01/30/2018  [facility]- - LOV 05/17/24 LOV 05/20/24 in Media Tab   PPM/ ICD: Device Orders: Rep Notified:  Chest x-ray : 03/26/24- 2 view  EKG : 06/17/24  Echo :03/27/24  CT Cors- 2019  Stress test: Cardiac Cath :   Activity level: can do a flight of stairs wtihout difficutly  Sleep Study/ CPAP : none  Fasting Blood Sugar :      / Checks Blood Sugar -- times a day:    Blood Thinner/ Instructions /Last Dose: ASA / Instructions/ Last Dose :  81 mg aspiorin   No orders in at preop appt.

## 2024-06-16 NOTE — Patient Instructions (Signed)
 SURGICAL WAITING ROOM VISITATION  Patients having surgery or a procedure may have no more than 2 support people in the waiting area - these visitors may rotate.    Children under the age of 56 must have an adult with them who is not the patient.  Visitors with respiratory illnesses are discouraged from visiting and should remain at home.  If the patient needs to stay at the hospital during part of their recovery, the visitor guidelines for inpatient rooms apply. Pre-op nurse will coordinate an appropriate time for 1 support person to accompany patient in pre-op.  This support person may not rotate.    Please refer to the [facility] website for the visitor guidelines for Inpatients (after your surgery is over and you are in a regular room).       Your procedure is scheduled on:  06/30/2024    Report to [facility] Main Entrance    Report to admitting at  0745 AM   Call this number if you have problems the morning of surgery [phone]   Do not eat food :After Midnight.   After Midnight you may have the following liquids until _ 0715am _____ AM DAY OF SURGERY  Water Non-Citrus Juices (without pulp, NO RED-Apple, White grape, White cranberry) Black Coffee (NO MILK/CREAM OR CREAMERS, sugar ok)  Clear Tea (NO MILK/CREAM OR CREAMERS, sugar ok) regular and decaf                             Plain Jell-O (NO RED)                                           Fruit ices (not with fruit pulp, NO RED)                                     Popsicles (NO RED)                                                               Sports drinks like Gatorade (NO RED)                    The day of surgery:  Drink ONE (1) Pre-Surgery Clear Ensure or G2 at 0715 AM the morning of surgery. Drink in one sitting. Do not sip.  This drink was given to you during your hospital  pre-op appointment visit. Nothing else to drink after completing the  Pre-Surgery Clear Ensure or G2.          If you have  questions, please contact your surgeon's office.       Oral Hygiene is also important to reduce your risk of infection.                                    Remember - BRUSH YOUR TEETH THE MORNING OF SURGERY WITH YOUR REGULAR TOOTHPASTE  DENTURES WILL BE REMOVED PRIOR TO SURGERY PLEASE DO NOT APPLY Poly grip OR  ADHESIVES!!!   Do NOT smoke after Midnight   Stop all vitamins and herbal supplements 7 days before surgery.   Take these medicines the morning of surgery with A SIP OF WATER:  amlodipine , cymbalta , omeprazole, nexium, flonase   DO NOT TAKE ANY ORAL DIABETIC MEDICATIONS DAY OF YOUR SURGERY  Bring CPAP mask and tubing day of surgery.                              You may not have any metal on your body including hair pins, jewelry, and body piercing             Do not wear make-up, lotions, powders, perfumes/cologne, or deodorant  Do not wear nail polish including gel and S&S, artificial/acrylic nails, or any other type of covering on natural nails including finger and toenails. If you have artificial nails, gel coating, etc. that needs to be removed by a nail salon please have this removed prior to surgery or surgery may need to be canceled/ delayed if the surgeon/ anesthesia feels like they are unable to be safely monitored.   Do not shave  48 hours prior to surgery.               Men may shave face and neck.   Do not bring valuables to the hospital.  IS NOT             RESPONSIBLE   FOR VALUABLES.   Contacts, glasses, dentures or bridgework may not be worn into surgery.   Bring small overnight bag day of surgery.   DO NOT BRING YOUR HOME MEDICATIONS TO THE HOSPITAL. PHARMACY WILL DISPENSE MEDICATIONS LISTED ON YOUR MEDICATION LIST TO YOU DURING YOUR ADMISSION IN THE HOSPITAL!    Patients discharged on the day of surgery will not be allowed to drive home.  Someone NEEDS to stay with you for the first 24 hours after anesthesia.   Special Instructions: Bring  a copy of your healthcare power of attorney and living will documents the day of surgery if you haven't scanned them before.              Please read over the following fact sheets you were given: IF YOU HAVE QUESTIONS ABOUT YOUR PRE-OP INSTRUCTIONS PLEASE CALL [phone].   If you received a COVID test during your pre-op visit  it is requested that you wear a mask when out in public, stay away from anyone that may not be feeling well and notify your surgeon if you develop symptoms. If you test positive for Covid or have been in contact with anyone that has tested positive in the last 10 days please notify you surgeon.      Pre-operative 5 CHG Bath Instructions   You can play a key role in reducing the risk of infection after surgery. Your skin needs to be as free of germs as possible. You can reduce the number of germs on your skin by washing with CHG (chlorhexidine  gluconate) soap before surgery. CHG is an antiseptic soap that kills germs and continues to kill germs even after washing.   DO NOT use if you have an allergy  to chlorhexidine /CHG or antibacterial soaps. If your skin becomes reddened or irritated, stop using the CHG and notify one of our RNs at [phone].   Please shower with the CHG soap starting 4 days before surgery using the following schedule:     Please keep  in mind the following:  DO NOT shave, including legs and underarms, starting the day of your first shower.   You may shave your face at any point before/day of surgery.  Place clean sheets on your bed the day you start using CHG soap. Use a clean washcloth (not used since being washed) for each shower. DO NOT sleep with pets once you start using the CHG.   CHG Shower Instructions:  If you choose to wash your hair and private area, wash first with your normal shampoo/soap.  After you use shampoo/soap, rinse your hair and body thoroughly to remove shampoo/soap residue.  Turn the water OFF and apply about 3  tablespoons (45 ml) of CHG soap to a CLEAN washcloth.  Apply CHG soap ONLY FROM YOUR NECK DOWN TO YOUR TOES (washing for 3-5 minutes)  DO NOT use CHG soap on face, private areas, open wounds, or sores.  Pay special attention to the area where your surgery is being performed.  If you are having back surgery, having someone wash your back for you may be helpful. Wait 2 minutes after CHG soap is applied, then you may rinse off the CHG soap.  Pat dry with a clean towel  Put on clean clothes/pajamas   If you choose to wear lotion, please use ONLY the CHG-compatible lotions on the back of this paper.     Additional instructions for the day of surgery: DO NOT APPLY any lotions, deodorants, cologne, or perfumes.   Put on clean/comfortable clothes.  Brush your teeth.  Ask your nurse before applying any prescription medications to the skin.      CHG Compatible Lotions   Aveeno Moisturizing lotion  Cetaphil Moisturizing Cream  Cetaphil Moisturizing Lotion  Clairol Herbal Essence Moisturizing Lotion, Dry Skin  Clairol Herbal Essence Moisturizing Lotion, Extra Dry Skin  Clairol Herbal Essence Moisturizing Lotion, Normal Skin  Curel Age Defying Therapeutic Moisturizing Lotion with Alpha Hydroxy  Curel Extreme Care Body Lotion  Curel Soothing Hands Moisturizing Hand Lotion  Curel Therapeutic Moisturizing Cream, Fragrance-Free  Curel Therapeutic Moisturizing Lotion, Fragrance-Free  Curel Therapeutic Moisturizing Lotion, Original Formula  Eucerin Daily Replenishing Lotion  Eucerin Dry Skin Therapy Plus Alpha Hydroxy Crme  Eucerin Dry Skin Therapy Plus Alpha Hydroxy Lotion  Eucerin Original Crme  Eucerin Original Lotion  Eucerin Plus Crme Eucerin Plus Lotion  Eucerin TriLipid Replenishing Lotion  Keri Anti-Bacterial Hand Lotion  Keri Deep Conditioning Original Lotion Dry Skin Formula Softly Scented  Keri Deep Conditioning Original Lotion, Fragrance Free Sensitive Skin Formula  Keri  Lotion Fast Absorbing Fragrance Free Sensitive Skin Formula  Keri Lotion Fast Absorbing Softly Scented Dry Skin Formula  Keri Original Lotion  Keri Skin Renewal Lotion Keri Silky Smooth Lotion  Keri Silky Smooth Sensitive Skin Lotion  Nivea Body Creamy Conditioning Oil  Nivea Body Extra Enriched Teacher, adult education Moisturizing Lotion Nivea Crme  Nivea Skin Firming Lotion  NutraDerm 30 Skin Lotion  NutraDerm Skin Lotion  NutraDerm Therapeutic Skin Cream  NutraDerm Therapeutic Skin Lotion  ProShield Protective Hand Cream  Provon moisturizing lotion

## 2024-06-17 ENCOUNTER — Other Ambulatory Visit: Payer: Self-pay

## 2024-06-17 ENCOUNTER — Encounter (HOSPITAL_COMMUNITY): Payer: Self-pay

## 2024-06-17 ENCOUNTER — Encounter (HOSPITAL_COMMUNITY)
Admission: RE | Admit: 2024-06-17 | Discharge: 2024-06-17 | Disposition: A | Discharge status: Home / Self Care | Source: Ambulatory Visit | Attending: Orthopedic Surgery | Admitting: Orthopedic Surgery

## 2024-06-17 VITALS — BP 144/97 | HR 80 | Temp 98.5°F | Resp 16 | Ht 60.5 in | Wt 176.0 lb

## 2024-06-17 DIAGNOSIS — Z01818 Encounter for other preprocedural examination: Secondary | ICD-10-CM | POA: Insufficient documentation

## 2024-06-17 DIAGNOSIS — I10 Essential (primary) hypertension: Secondary | ICD-10-CM | POA: Diagnosis not present

## 2024-06-17 DIAGNOSIS — Y831 Surgical operation with implant of artificial internal device as the cause of abnormal reaction of the patient, or of later complication, without mention of misadventure at the time of the procedure: Secondary | ICD-10-CM | POA: Insufficient documentation

## 2024-06-17 DIAGNOSIS — K219 Gastro-esophageal reflux disease without esophagitis: Secondary | ICD-10-CM | POA: Insufficient documentation

## 2024-06-17 DIAGNOSIS — T84019A Broken internal joint prosthesis, unspecified site, initial encounter: Secondary | ICD-10-CM | POA: Diagnosis not present

## 2024-06-17 HISTORY — DX: Family history of other specified conditions: Z84.89

## 2024-06-17 HISTORY — DX: Other specified postprocedural states: Z98.890

## 2024-06-17 HISTORY — DX: Unspecified osteoarthritis, unspecified site: M19.90

## 2024-06-17 HISTORY — DX: Anxiety disorder, unspecified: F41.9

## 2024-06-17 HISTORY — DX: Nausea with vomiting, unspecified: R11.2

## 2024-06-17 LAB — CBC
HCT: 36.7 % (ref 36.0–46.0)
Hemoglobin: 11.6 g/dL — ABNORMAL LOW (ref 12.0–15.0)
MCH: 28.6 pg (ref 26.0–34.0)
MCHC: 31.6 g/dL (ref 30.0–36.0)
MCV: 90.6 fL (ref 80.0–100.0)
Platelets: 230 K/uL (ref 150–400)
RBC: 4.05 MIL/uL (ref 3.87–5.11)
RDW: 13.2 % (ref 11.5–15.5)
WBC: 5.1 K/uL (ref 4.0–10.5)
nRBC: 0 % (ref 0.0–0.2)

## 2024-06-17 LAB — BASIC METABOLIC PANEL WITH GFR
Anion gap: 12 (ref 5–15)
BUN: 15 mg/dL (ref 8–23)
CO2: 22 mmol/L (ref 22–32)
Calcium: 9.5 mg/dL (ref 8.9–10.3)
Chloride: 101 mmol/L (ref 98–111)
Creatinine, Ser: 0.75 mg/dL (ref 0.44–1.00)
GFR, Estimated: >60 mL/min (ref >60)
Glucose, Bld: 87 mg/dL (ref 70–99)
Potassium: 4.7 mmol/L (ref 3.5–5.1)
Sodium: 135 mmol/L (ref 135–145)

## 2024-06-17 LAB — TYPE AND SCREEN
ABO/RH(D): A POS
Antibody Screen: NEGATIVE

## 2024-06-17 LAB — SURGICAL PCR SCREEN
MRSA, PCR: NEGATIVE
Staphylococcus aureus: NEGATIVE

## 2024-06-18 ENCOUNTER — Other Ambulatory Visit: Payer: Self-pay | Admitting: Orthopedic Surgery

## 2024-06-18 DIAGNOSIS — M25562 Pain in left knee: Secondary | ICD-10-CM | POA: Diagnosis not present

## 2024-06-18 DIAGNOSIS — Z96652 Presence of left artificial knee joint: Secondary | ICD-10-CM | POA: Diagnosis not present

## 2024-06-18 DIAGNOSIS — T8484XA Pain due to internal orthopedic prosthetic devices, implants and grafts, initial encounter: Secondary | ICD-10-CM

## 2024-06-18 DIAGNOSIS — G8929 Other chronic pain: Secondary | ICD-10-CM | POA: Diagnosis not present

## 2024-06-18 NOTE — Anesthesia Preprocedure Evaluation (Addendum)
 Anesthesia Evaluation  Patient identified by MRN, date of birth, ID band Patient awake    Reviewed: Allergy  & Precautions, NPO status , Patient's Chart, lab work & pertinent test results  History of Anesthesia Complications (+) PONV and history of anesthetic complications  Airway Mallampati: II  TM Distance: >3 FB Neck ROM: Full    Dental no notable dental hx. (+) Teeth Intact, Dental Advisory Given   Pulmonary neg pulmonary ROS   Pulmonary exam normal breath sounds clear to auscultation       Cardiovascular hypertension, Pt. on medications + CAD  Normal cardiovascular exam Rhythm:Regular Rate:Normal  Echo 03/27/24  1. Left ventricular ejection fraction, by estimation, is 55 to 60%. Left  ventricular ejection fraction by 3D volume is 55 %. The left ventricle has  normal function. The left ventricle has no regional wall motion  abnormalities. There is mild left  ventricular hypertrophy of the basal-septal segment. Left ventricular  diastolic parameters are consistent with Grade I diastolic dysfunction  (impaired relaxation). The average left ventricular global longitudinal  strain is -19.8 %. The global  longitudinal strain is normal.   2. Right ventricular systolic function is normal. The right ventricular  size is normal. There is normal pulmonary artery systolic pressure. The  estimated right ventricular systolic pressure is 22.5 mmHg.   3. The mitral valve is grossly normal. Mild mitral valve regurgitation.   4. The aortic valve is tricuspid. Aortic valve regurgitation is not  visualized. Aortic valve sclerosis/calcification is present, without any  evidence of aortic stenosis.   5. The inferior vena cava is normal in size with greater than 50%  respiratory variability, suggesting right atrial pressure of 3 mmHg.     Neuro/Psych  PSYCHIATRIC DISORDERS Anxiety     negative neurological ROS     GI/Hepatic Neg liver  ROS,GERD  ,,  Endo/Other  negative endocrine ROS    Renal/GU negative Renal ROS  negative genitourinary   Musculoskeletal  (+) Arthritis , Rheumatoid disorders,    Abdominal   Peds  Hematology negative hematology ROS (+)   Anesthesia Other Findings   Reproductive/Obstetrics                              Anesthesia Physical Anesthesia Plan  ASA: 2  Anesthesia Plan: Spinal and Regional   Post-op Pain Management: Regional block* and Tylenol  PO (pre-op)*   Induction:   PONV Risk Score and Plan: 3 and Treatment may vary due to age or medical condition, Propofol  infusion, Ondansetron  and Dexamethasone   Airway Management Planned: Natural Airway  Additional Equipment:   Intra-op Plan:   Post-operative Plan:   Informed Consent: I have reviewed the patients History and Physical, chart, labs and discussed the procedure including the risks, benefits and alternatives for the proposed anesthesia with the patient or authorized representative who has indicated his/her understanding and acceptance.     Dental advisory given  Plan Discussed with: CRNA  Anesthesia Plan Comments: (See PAT note 06/17/24)         Anesthesia Quick Evaluation

## 2024-06-18 NOTE — Progress Notes (Signed)
 Anesthesia Chart Review   Case: [id number] Date/Time: 06/30/24 1000   Procedure: TOTAL KNEE REVISION (Left: Knee)   Anesthesia type: Spinal   Diagnosis: Failure of arthroplasty, initial encounter [T84.019A]   Pre-op diagnosis: Failure of arthroplasty, initial encounter [facility] T84.019A   Location: [facility] ROOM 06 / [facility]   Surgeons: Rubie Kemps, MD       DISCUSSION:73 y.o. never smoker with h/o PONV, HTN, GERD, right total knee replacement failure scheduled for above procedure 06/30/2024 with Dr. Kemps Rubie.   Pt reports she can climb a flight of stairs without chest pain or shortness of breath.   Pt seen by PCP 03/26/24 for proep evaluation.  Per OV note, Jennifer Davenport should be medically clear for left knee total knee arthroplasty redo by Dr. Dorian under spinal anesthesia assuming that her lab work comes back acceptable.  She can stop and restart her aspirin per your perioperative protocol. ECHO ordered.  Her chest x-ray was ordered.   Echo 03/27/24 with EF 55-60%, mild mitral valve regurgitation.   VS: BP (!) 144/97   Pulse 80   Temp 36.9 C (Oral)   Resp 16   Ht 5' 0.5 (1.537 m)   Wt 79.8 kg   LMP 11/24/1982   SpO2 97%   BMI 33.81 kg/m   PROVIDERS: Plotnikov, Karlynn GAILS, MD is PCP    LABS: Labs reviewed: Acceptable for surgery. (all labs ordered are listed, but only abnormal results are displayed)  Labs Reviewed  CBC - Abnormal; Notable for the following components:      Result Value   Hemoglobin 11.6 (*)    All other components within normal limits  SURGICAL PCR SCREEN  BASIC METABOLIC PANEL WITH GFR  TYPE AND SCREEN     IMAGES:   EKG:   CV: Echo 03/27/24  1. Left ventricular ejection fraction, by estimation, is 55 to 60%. Left  ventricular ejection fraction by 3D volume is 55 %. The left ventricle has  normal function. The left ventricle has no regional wall motion  abnormalities. There is mild left  ventricular hypertrophy of the basal-septal segment. Left ventricular   diastolic parameters are consistent with Grade I diastolic dysfunction  (impaired relaxation). The average left ventricular global longitudinal  strain is -19.8 %. The global  longitudinal strain is normal.   2. Right ventricular systolic function is normal. The right ventricular  size is normal. There is normal pulmonary artery systolic pressure. The  estimated right ventricular systolic pressure is 22.5 mmHg.   3. The mitral valve is grossly normal. Mild mitral valve regurgitation.   4. The aortic valve is tricuspid. Aortic valve regurgitation is not  visualized. Aortic valve sclerosis/calcification is present, without any  evidence of aortic stenosis.   5. The inferior vena cava is normal in size with greater than 50%  respiratory variability, suggesting right atrial pressure of 3 mmHg.   Past Medical History:  Diagnosis Date   Abnormal Pap smear of cervix    Allergy     Anxiety    Arthritis    Atrophic vaginitis    Bulging disc 10/2012   CERVICAL   Bursitis of hip    Cataract    mild starting of    Endometriosis    Family history of adverse reaction to anesthesia    daughter as teenager thrashing around after surgery   GERD (gastroesophageal reflux disease)    Hyperlipidemia    Hypertension    IBS (irritable bowel syndrome)    PONV (postoperative nausea  and vomiting)     Past Surgical History:  Procedure Laterality Date   ABDOMINAL HYSTERECTOMY  11/1982   AUB & endometriosis, ovaries remain   APPENDECTOMY  09/1970   bilateral knee replacements      BREAST LUMPECTOMY WITH RADIOACTIVE SEED LOCALIZATION Right 04/29/2021   Procedure: RIGHT BREAST LUMPECTOMY WITH RADIOACTIVE SEED LOCALIZATION;  Surgeon: Curvin Deward MOULD, MD;  Location: [facility];  Service: General;  Laterality: Right;   BUNIONECTOMY Left 04/2004   left foot   CATARACT EXTRACTION     CERVIX LESION DESTRUCTION     COLONOSCOPY     CRYOTHERAPY     for abnormal pap smear   eyelid surgery  Bilateral 2018   HYSTEROTOMY  01/1971   fetal demise, also BTL   LAPAROSCOPIC CHOLECYSTECTOMY  02/2005   POLYPECTOMY     TMJ ARTHROPLASTY  02/1993   secondary to hockey injury 15 years earlier   TUBAL LIGATION Bilateral 01/1971   UPPER GASTROINTESTINAL ENDOSCOPY      MEDICATIONS:  acetaminophen  (TYLENOL ) 500 MG tablet   amLODipine  (NORVASC ) 5 MG tablet   aspirin 81 MG EC tablet   atorvastatin  (LIPITOR) 80 MG tablet   b complex vitamins tablet   Carboxymethylcellulose Sodium (THERATEARS) 0.25 % SOLN   cetirizine  (ZYRTEC ) 10 MG tablet   Chlorpheniramine-DM (CORICIDIN COUGH/COLD) 4-30 MG TABS   Cholecalciferol (VITAMIN D) 50 MCG (2000 UT) tablet   Coenzyme Q10 (COQ10) 100 MG CAPS   dicyclomine  (BENTYL ) 10 MG capsule   DULoxetine  (CYMBALTA ) 30 MG capsule   esomeprazole (NEXIUM) 20 MG capsule   fluticasone (FLONASE) 50 MCG/ACT nasal spray   furosemide  (LASIX ) 20 MG tablet   hydroxychloroquine (PLAQUENIL) 200 MG tablet   LORazepam  (ATIVAN ) 1 MG tablet   losartan  (COZAAR ) 100 MG tablet   Tocilizumab  (ACTEMRA ) 200 MG/10ML SOLN   No current facility-administered medications for this encounter.    Harlene Hoots Ward, PA-C [facility] [phone]

## 2024-06-19 DIAGNOSIS — Z79899 Other long term (current) drug therapy: Secondary | ICD-10-CM | POA: Diagnosis not present

## 2024-06-26 DIAGNOSIS — Z1231 Encounter for screening mammogram for malignant neoplasm of breast: Secondary | ICD-10-CM | POA: Diagnosis not present

## 2024-06-30 ENCOUNTER — Inpatient Hospital Stay (HOSPITAL_COMMUNITY)
Admission: RE | Admit: 2024-06-30 | Discharge: 2024-06-30 | DRG: 468 | Disposition: A | Discharge status: Home / Self Care | Attending: Orthopedic Surgery | Admitting: Orthopedic Surgery

## 2024-06-30 ENCOUNTER — Inpatient Hospital Stay (HOSPITAL_COMMUNITY): Admitting: Certified Registered"

## 2024-06-30 ENCOUNTER — Encounter (HOSPITAL_COMMUNITY)
Admission: RE | Disposition: A | Discharge status: Home / Self Care | Payer: Self-pay | Source: Home / Self Care | Attending: Orthopedic Surgery

## 2024-06-30 ENCOUNTER — Encounter (HOSPITAL_COMMUNITY): Payer: Self-pay | Admitting: Orthopedic Surgery

## 2024-06-30 ENCOUNTER — Inpatient Hospital Stay (HOSPITAL_COMMUNITY): Payer: Self-pay | Admitting: Medical

## 2024-06-30 ENCOUNTER — Other Ambulatory Visit: Payer: Self-pay

## 2024-06-30 DIAGNOSIS — Z803 Family history of malignant neoplasm of breast: Secondary | ICD-10-CM

## 2024-06-30 DIAGNOSIS — Z90710 Acquired absence of both cervix and uterus: Secondary | ICD-10-CM

## 2024-06-30 DIAGNOSIS — Z881 Allergy status to other antibiotic agents status: Secondary | ICD-10-CM

## 2024-06-30 DIAGNOSIS — K219 Gastro-esophageal reflux disease without esophagitis: Secondary | ICD-10-CM | POA: Diagnosis present

## 2024-06-30 DIAGNOSIS — Y831 Surgical operation with implant of artificial internal device as the cause of abnormal reaction of the patient, or of later complication, without mention of misadventure at the time of the procedure: Secondary | ICD-10-CM | POA: Diagnosis present

## 2024-06-30 DIAGNOSIS — Z883 Allergy status to other anti-infective agents status: Secondary | ICD-10-CM | POA: Diagnosis not present

## 2024-06-30 DIAGNOSIS — Z7982 Long term (current) use of aspirin: Secondary | ICD-10-CM | POA: Diagnosis not present

## 2024-06-30 DIAGNOSIS — F419 Anxiety disorder, unspecified: Secondary | ICD-10-CM | POA: Diagnosis present

## 2024-06-30 DIAGNOSIS — T84093A Other mechanical complication of internal left knee prosthesis, initial encounter: Secondary | ICD-10-CM

## 2024-06-30 DIAGNOSIS — M1712 Unilateral primary osteoarthritis, left knee: Secondary | ICD-10-CM | POA: Diagnosis present

## 2024-06-30 DIAGNOSIS — E785 Hyperlipidemia, unspecified: Secondary | ICD-10-CM | POA: Diagnosis present

## 2024-06-30 DIAGNOSIS — T84013A Broken internal left knee prosthesis, initial encounter: Secondary | ICD-10-CM | POA: Diagnosis not present

## 2024-06-30 DIAGNOSIS — Z9049 Acquired absence of other specified parts of digestive tract: Secondary | ICD-10-CM

## 2024-06-30 DIAGNOSIS — Z888 Allergy status to other drugs, medicaments and biological substances status: Secondary | ICD-10-CM

## 2024-06-30 DIAGNOSIS — Z885 Allergy status to narcotic agent status: Secondary | ICD-10-CM

## 2024-06-30 DIAGNOSIS — Z91018 Allergy to other foods: Secondary | ICD-10-CM | POA: Diagnosis not present

## 2024-06-30 DIAGNOSIS — Z823 Family history of stroke: Secondary | ICD-10-CM

## 2024-06-30 DIAGNOSIS — Z801 Family history of malignant neoplasm of trachea, bronchus and lung: Secondary | ICD-10-CM

## 2024-06-30 DIAGNOSIS — M069 Rheumatoid arthritis, unspecified: Secondary | ICD-10-CM | POA: Diagnosis present

## 2024-06-30 DIAGNOSIS — Z886 Allergy status to analgesic agent status: Secondary | ICD-10-CM

## 2024-06-30 DIAGNOSIS — Z96652 Presence of left artificial knee joint: Secondary | ICD-10-CM

## 2024-06-30 DIAGNOSIS — I10 Essential (primary) hypertension: Secondary | ICD-10-CM | POA: Diagnosis present

## 2024-06-30 DIAGNOSIS — Z8249 Family history of ischemic heart disease and other diseases of the circulatory system: Secondary | ICD-10-CM | POA: Diagnosis not present

## 2024-06-30 DIAGNOSIS — Z79899 Other long term (current) drug therapy: Secondary | ICD-10-CM

## 2024-06-30 DIAGNOSIS — I2510 Atherosclerotic heart disease of native coronary artery without angina pectoris: Secondary | ICD-10-CM | POA: Diagnosis present

## 2024-06-30 DIAGNOSIS — K589 Irritable bowel syndrome without diarrhea: Secondary | ICD-10-CM | POA: Diagnosis present

## 2024-06-30 DIAGNOSIS — Z83438 Family history of other disorder of lipoprotein metabolism and other lipidemia: Secondary | ICD-10-CM | POA: Diagnosis not present

## 2024-06-30 DIAGNOSIS — G8918 Other acute postprocedural pain: Secondary | ICD-10-CM | POA: Diagnosis not present

## 2024-06-30 DIAGNOSIS — Z882 Allergy status to sulfonamides status: Secondary | ICD-10-CM | POA: Diagnosis not present

## 2024-06-30 DIAGNOSIS — Z887 Allergy status to serum and vaccine status: Secondary | ICD-10-CM | POA: Diagnosis not present

## 2024-06-30 DIAGNOSIS — T8484XA Pain due to internal orthopedic prosthetic devices, implants and grafts, initial encounter: Secondary | ICD-10-CM | POA: Diagnosis present

## 2024-06-30 DIAGNOSIS — Z96659 Presence of unspecified artificial knee joint: Principal | ICD-10-CM

## 2024-06-30 DIAGNOSIS — M25562 Pain in left knee: Secondary | ICD-10-CM | POA: Diagnosis not present

## 2024-06-30 DIAGNOSIS — Z01818 Encounter for other preprocedural examination: Secondary | ICD-10-CM

## 2024-06-30 HISTORY — PX: TOTAL KNEE REVISION: SHX996

## 2024-06-30 LAB — CBC WITH DIFFERENTIAL/PLATELET
Abs Immature Granulocytes: 0.03 K/uL (ref 0.00–0.07)
Basophils Absolute: 0 K/uL (ref 0.0–0.1)
Basophils Relative: 1 %
Eosinophils Absolute: 0.1 K/uL (ref 0.0–0.5)
Eosinophils Relative: 2 %
HCT: 37.1 % (ref 36.0–46.0)
Hemoglobin: 11.1 g/dL — ABNORMAL LOW (ref 12.0–15.0)
Immature Granulocytes: 1 %
Lymphocytes Relative: 26 %
Lymphs Abs: 1.1 K/uL (ref 0.7–4.0)
MCH: 27.4 pg (ref 26.0–34.0)
MCHC: 29.9 g/dL — ABNORMAL LOW (ref 30.0–36.0)
MCV: 91.6 fL (ref 80.0–100.0)
Monocytes Absolute: 0.1 K/uL (ref 0.1–1.0)
Monocytes Relative: 3 %
Neutro Abs: 2.8 K/uL (ref 1.7–7.7)
Neutrophils Relative %: 67 %
Platelets: 212 K/uL (ref 150–400)
RBC: 4.05 MIL/uL (ref 3.87–5.11)
RDW: 13.2 % (ref 11.5–15.5)
WBC: 4.1 K/uL (ref 4.0–10.5)
nRBC: 0 % (ref 0.0–0.2)

## 2024-06-30 LAB — COMPREHENSIVE METABOLIC PANEL WITH GFR
ALT: 57 U/L — ABNORMAL HIGH (ref 0–44)
AST: 72 U/L — ABNORMAL HIGH (ref 15–41)
Albumin: 4.3 g/dL (ref 3.5–5.0)
Alkaline Phosphatase: 59 U/L (ref 38–126)
Anion gap: 10 (ref 5–15)
BUN: 10 mg/dL (ref 8–23)
CO2: 24 mmol/L (ref 22–32)
Calcium: 8.9 mg/dL (ref 8.9–10.3)
Chloride: 108 mmol/L (ref 98–111)
Creatinine, Ser: 0.53 mg/dL (ref 0.44–1.00)
GFR, Estimated: >60 mL/min (ref >60)
Glucose, Bld: 115 mg/dL — ABNORMAL HIGH (ref 70–99)
Potassium: 4.3 mmol/L (ref 3.5–5.1)
Sodium: 141 mmol/L (ref 135–145)
Total Bilirubin: 0.4 mg/dL (ref 0.0–1.2)
Total Protein: 6.4 g/dL — ABNORMAL LOW (ref 6.5–8.1)

## 2024-06-30 LAB — ABO/RH: ABO/RH(D): A POS

## 2024-06-30 SURGERY — TOTAL KNEE REVISION
Anesthesia: Regional | Site: Knee | Laterality: Left

## 2024-06-30 MED ORDER — POVIDONE-IODINE 10 % EX SWAB: 2.0000 | Freq: Once | CUTANEOUS | Status: DC

## 2024-06-30 MED ORDER — AMISULPRIDE (ANTIEMETIC) 5 MG/2ML IV SOLN: 10.0000 mg | Freq: Once | INTRAVENOUS | Status: DC | PRN

## 2024-06-30 MED ORDER — BUPIVACAINE LIPOSOME 1.3 % IJ SUSP
INTRAMUSCULAR | Status: AC
Start: 2024-06-30 — End: 2024-06-30
  Filled 2024-06-30: qty 20

## 2024-06-30 MED ORDER — OXYCODONE HCL 5 MG PO TABS
5.0000 mg | ORAL_TABLET | Freq: Once | ORAL | Status: AC | PRN
  Administered 2024-06-30: 5 mg via ORAL

## 2024-06-30 MED ORDER — LACTATED RINGERS IV SOLN
INTRAVENOUS | Status: DC
Start: 2024-06-30 — End: 2024-06-30

## 2024-06-30 MED ORDER — BUPIVACAINE LIPOSOME 1.3 % IJ SUSP: 20.0000 mL | Freq: Once | INTRAMUSCULAR | Status: DC

## 2024-06-30 MED ORDER — SODIUM CHLORIDE (PF) 0.9 % IJ SOLN
INTRAMUSCULAR | Status: DC | PRN
  Administered 2024-06-30: 70 mL

## 2024-06-30 MED ORDER — OXYCODONE HCL 5 MG PO TABS: 5.0000 mg | ORAL_TABLET | ORAL | 0 refills | Status: AC | PRN

## 2024-06-30 MED ORDER — FENTANYL CITRATE (PF) 250 MCG/5ML IJ SOLN
INTRAMUSCULAR | Status: DC | PRN
  Administered 2024-06-30 (×2): 50 ug via INTRAVENOUS

## 2024-06-30 MED ORDER — LABETALOL HCL 5 MG/ML IV SOLN
INTRAVENOUS | Status: AC
  Filled 2024-06-30: qty 4

## 2024-06-30 MED ORDER — OXYCODONE HCL 5 MG PO TABS
ORAL_TABLET | ORAL | Status: AC
  Filled 2024-06-30: qty 1

## 2024-06-30 MED ORDER — MIDAZOLAM HCL 2 MG/2ML IJ SOLN: 1.0000 mg | INTRAMUSCULAR | Status: DC

## 2024-06-30 MED ORDER — ONDANSETRON 4 MG PO TBDP: 4.0000 mg | ORAL_TABLET | Freq: Three times a day (TID) | ORAL | 0 refills | Status: AC | PRN

## 2024-06-30 MED ORDER — LIDOCAINE 2% (20 MG/ML) 5 ML SYRINGE
INTRAMUSCULAR | Status: DC | PRN
  Administered 2024-06-30: 40 mg via INTRAVENOUS

## 2024-06-30 MED ORDER — PROPOFOL 1000 MG/100ML IV EMUL
INTRAVENOUS | Status: AC
Start: 2024-06-30 — End: 2024-06-30
  Filled 2024-06-30: qty 100

## 2024-06-30 MED ORDER — SODIUM CHLORIDE (PF) 0.9 % IJ SOLN
INTRAMUSCULAR | Status: AC
  Filled 2024-06-30: qty 20

## 2024-06-30 MED ORDER — ACETAMINOPHEN 500 MG PO TABS
1000.0000 mg | ORAL_TABLET | Freq: Once | ORAL | Status: AC
  Administered 2024-06-30: 1000 mg via ORAL
  Filled 2024-06-30: qty 2

## 2024-06-30 MED ORDER — METHOCARBAMOL 500 MG PO TABS: 500.0000 mg | ORAL_TABLET | Freq: Four times a day (QID) | ORAL | 0 refills | Status: AC

## 2024-06-30 MED ORDER — OXYCODONE HCL 5 MG/5ML PO SOLN: 5.0000 mg | Freq: Once | ORAL | Status: AC | PRN

## 2024-06-30 MED ORDER — ROPIVACAINE HCL 5 MG/ML IJ SOLN
INTRAMUSCULAR | Status: DC | PRN
  Administered 2024-06-30: 30 mL via PERINEURAL

## 2024-06-30 MED ORDER — DOCUSATE SODIUM 100 MG PO CAPS: 100.0000 mg | ORAL_CAPSULE | Freq: Every day | ORAL | 2 refills | Status: AC | PRN

## 2024-06-30 MED ORDER — DEXAMETHASONE SODIUM PHOSPHATE 10 MG/ML IJ SOLN
8.0000 mg | Freq: Once | INTRAMUSCULAR | Status: AC
  Administered 2024-06-30: 8 mg via INTRAVENOUS

## 2024-06-30 MED ORDER — LABETALOL HCL 5 MG/ML IV SOLN
5.0000 mg | Freq: Once | INTRAVENOUS | Status: AC
  Administered 2024-06-30: 5 mg via INTRAVENOUS

## 2024-06-30 MED ORDER — CHLORHEXIDINE GLUCONATE 0.12 % MT SOLN
15.0000 mL | Freq: Once | OROMUCOSAL | Status: AC
  Administered 2024-06-30: 15 mL via OROMUCOSAL

## 2024-06-30 MED ORDER — 0.9 % SODIUM CHLORIDE (POUR BTL) OPTIME
TOPICAL | Status: DC | PRN
  Administered 2024-06-30: 1000 mL

## 2024-06-30 MED ORDER — SODIUM CHLORIDE 0.9 % IR SOLN
Status: DC | PRN
  Administered 2024-06-30: 1000 mL

## 2024-06-30 MED ORDER — ORAL CARE MOUTH RINSE: 15.0000 mL | Freq: Once | OROMUCOSAL | Status: AC

## 2024-06-30 MED ORDER — DEXAMETHASONE SODIUM PHOSPHATE 10 MG/ML IJ SOLN
INTRAMUSCULAR | Status: AC
  Filled 2024-06-30: qty 1

## 2024-06-30 MED ORDER — FENTANYL CITRATE (PF) 100 MCG/2ML IJ SOLN
INTRAMUSCULAR | Status: AC
  Filled 2024-06-30: qty 2

## 2024-06-30 MED ORDER — FENTANYL CITRATE PF 50 MCG/ML IJ SOSY
50.0000 ug | PREFILLED_SYRINGE | INTRAMUSCULAR | Status: DC
  Administered 2024-06-30: 50 ug via INTRAVENOUS
  Filled 2024-06-30: qty 2

## 2024-06-30 MED ORDER — FENTANYL CITRATE PF 50 MCG/ML IJ SOSY: 50.0000 ug | PREFILLED_SYRINGE | INTRAMUSCULAR | Status: DC

## 2024-06-30 MED ORDER — FENTANYL CITRATE PF 50 MCG/ML IJ SOSY: 25.0000 ug | PREFILLED_SYRINGE | INTRAMUSCULAR | Status: DC | PRN

## 2024-06-30 MED ORDER — VANCOMYCIN HCL IN DEXTROSE 1-5 GM/200ML-% IV SOLN
1000.0000 mg | INTRAVENOUS | Status: AC
  Administered 2024-06-30: 1000 mg via INTRAVENOUS
  Filled 2024-06-30: qty 200

## 2024-06-30 MED ORDER — BUPIVACAINE-EPINEPHRINE (PF) 0.25% -1:200000 IJ SOLN
INTRAMUSCULAR | Status: AC
  Filled 2024-06-30: qty 30

## 2024-06-30 MED ORDER — DEXAMETHASONE SODIUM PHOSPHATE 10 MG/ML IJ SOLN
INTRAMUSCULAR | Status: DC | PRN
  Administered 2024-06-30: 10 mg via PERINEURAL

## 2024-06-30 MED ORDER — TRANEXAMIC ACID-NACL 1000-0.7 MG/100ML-% IV SOLN
1000.0000 mg | INTRAVENOUS | Status: AC
Start: 2024-06-30 — End: 2024-06-30
  Administered 2024-06-30: 1000 mg via INTRAVENOUS
  Filled 2024-06-30: qty 100

## 2024-06-30 MED ORDER — PROPOFOL 10 MG/ML IV BOLUS
INTRAVENOUS | Status: DC | PRN
  Administered 2024-06-30: 30 mg via INTRAVENOUS

## 2024-06-30 MED ORDER — GABAPENTIN 300 MG PO CAPS
300.0000 mg | ORAL_CAPSULE | Freq: Once | ORAL | Status: AC
Start: 2024-06-30 — End: 2024-06-30
  Administered 2024-06-30: 300 mg via ORAL
  Filled 2024-06-30: qty 1

## 2024-06-30 MED ORDER — PROPOFOL 500 MG/50ML IV EMUL
INTRAVENOUS | Status: DC | PRN
  Administered 2024-06-30: 80 ug/kg/min via INTRAVENOUS
  Administered 2024-06-30: 50 ug/kg/min via INTRAVENOUS

## 2024-06-30 SURGICAL SUPPLY — 47 items
ATTUNE PSRP INSR SZ4 7 KNEE (Insert) IMPLANT
BAG COUNTER SPONGE SURGICOUNT (BAG) IMPLANT
BAG ZIPLOCK 12X15 (MISCELLANEOUS) ×1 IMPLANT
BLADE SAGITTAL 13X1.27X60 (BLADE) IMPLANT
BLADE SAW SGTL 81X20 HD (BLADE) IMPLANT
BLADE SAW SGTL 83.5X18.5 (BLADE) IMPLANT
BLADE SURG 15 STRL LF DISP TIS (BLADE) ×1 IMPLANT
BNDG ELASTIC 6INX 5YD STR LF (GAUZE/BANDAGES/DRESSINGS) ×1 IMPLANT
BOWL SMART MIX CTS (DISPOSABLE) IMPLANT
COVER SURGICAL LIGHT HANDLE (MISCELLANEOUS) ×1 IMPLANT
CUFF TRNQT CYL 34X4.125X (TOURNIQUET CUFF) ×1 IMPLANT
DERMABOND ADVANCED .7 DNX12 (GAUZE/BANDAGES/DRESSINGS) IMPLANT
DRAPE INCISE IOBAN 66X45 STRL (DRAPES) ×2 IMPLANT
DRAPE U-SHAPE 47X51 STRL (DRAPES) ×1 IMPLANT
DRSG AQUACEL AG ADV 3.5X10 (GAUZE/BANDAGES/DRESSINGS) ×1 IMPLANT
DURAPREP 26ML APPLICATOR (WOUND CARE) ×2 IMPLANT
ELECT PENCIL ROCKER SW 15FT (MISCELLANEOUS) ×1 IMPLANT
ELECT REM PT RETURN 15FT ADLT (MISCELLANEOUS) ×1 IMPLANT
GLOVE BIO SURGEON STRL SZ 6.5 (GLOVE) IMPLANT
GLOVE BIOGEL M 7.0 STRL (GLOVE) IMPLANT
GLOVE BIOGEL PI IND STRL 7.0 (GLOVE) IMPLANT
GLOVE BIOGEL PI IND STRL 7.5 (GLOVE) IMPLANT
GLOVE BIOGEL PI IND STRL 8.5 (GLOVE) ×1 IMPLANT
GLOVE SURG ORTHO 8.0 STRL STRW (GLOVE) ×2 IMPLANT
GOWN STRL REUS W/ TWL LRG LVL3 (GOWN DISPOSABLE) IMPLANT
GOWN STRL REUS W/ TWL XL LVL3 (GOWN DISPOSABLE) ×2 IMPLANT
HOLDER FOLEY CATH W/STRAP (MISCELLANEOUS) ×1 IMPLANT
HOOD PEEL AWAY T7 (MISCELLANEOUS) ×3 IMPLANT
KIT TURNOVER KIT A (KITS) ×1 IMPLANT
MANIFOLD NEPTUNE II (INSTRUMENTS) ×1 IMPLANT
NS IRRIG 1000ML POUR BTL (IV SOLUTION) ×1 IMPLANT
PACK TOTAL KNEE CUSTOM (KITS) ×1 IMPLANT
PROTECTOR NERVE ULNAR (MISCELLANEOUS) ×1 IMPLANT
SET HNDPC FAN SPRY TIP SCT (DISPOSABLE) ×1 IMPLANT
SPIKE FLUID TRANSFER (MISCELLANEOUS) IMPLANT
STAPLER SKIN PROX 35W (STAPLE) IMPLANT
STRIP CLOSURE SKIN 1/2X4 (GAUZE/BANDAGES/DRESSINGS) ×1 IMPLANT
SUT BONE WAX W31G (SUTURE) ×1 IMPLANT
SUT MNCRL AB 3-0 PS2 18 (SUTURE) ×1 IMPLANT
SUT STRATAFIX PDS+ 0 24IN (SUTURE) ×1 IMPLANT
SUT VIC AB 0 CT1 36 (SUTURE) ×1 IMPLANT
SUT VIC AB 1 CT1 36 (SUTURE) ×1 IMPLANT
SUTURE STRATFX 0 PDS 27 VIOLET (SUTURE) ×1 IMPLANT
TRAY FOLEY MTR SLVR 16FR STAT (SET/KITS/TRAYS/PACK) ×1 IMPLANT
TUBE SUCTION HIGH CAP CLEAR NV (SUCTIONS) ×1 IMPLANT
WATER STERILE IRR 1000ML POUR (IV SOLUTION) ×1 IMPLANT
WRAP KNEE MAXI GEL POST OP (GAUZE/BANDAGES/DRESSINGS) ×1 IMPLANT

## 2024-06-30 NOTE — Discharge Instructions (Signed)

## 2024-06-30 NOTE — H&P (Signed)
 Jennifer Davenport MRN:  [id number] DOB/SEX:  October 31, 1950/female  CHIEF COMPLAINT:  Painful left Knee  HISTORY: Patient is a 73 y.o. female presented with a history of pain in the left knee. Onset of symptoms was gradual starting a few year ago with unchanged course since that time. Patient has been treated conservatively with over-the-counter NSAIDs and activity modification. Patient currently rates pain in the knee at 9 out of 10 with activity. There is pain at night.  PAST MEDICAL HISTORY: Patient Active Problem List   Diagnosis Date Noted   Heart murmur 03/26/2024   Cramp in limb 03/12/2024   Palpitations 12/24/2023   Peroneal tendinitis 01/03/2023   Primary localized osteoarthrosis of multiple sites 12/18/2022   Arthralgia of right ankle 12/13/2022   Trochanteric bursitis of right hip 06/20/2022   Insomnia 06/19/2022   Chills 08/23/2021   Pain and swelling of knee, left 08/11/2021   Rigors 08/11/2021   Deficiency anemia 08/11/2021   Postoperative wound infection 08/11/2021   Infection of skin and subcutaneous tissue 08/11/2021   Preop exam for internal medicine 06/14/2021   Complex sclerosing lesion of right breast 05/12/2021   Knee pain, left 03/14/2021   Breast mass, right 03/14/2021   Cough 09/09/2020   Trigger finger 06/02/2020   Daytime somnolence 01/31/2020   Hand pain, right 12/09/2019   Closed fracture of lateral malleolus 10/03/2019   Edema of lower extremity 06/16/2019   Bunion 05/30/2019   Anxiety disorder 10/08/2018   Neck pain 10/08/2018   Syncope 04/03/2018   CAD (coronary artery disease) 12/06/2017   Near syncope 08/03/2017   Gluten intolerance 08/03/2017   Droopy eyelid, bilateral 08/03/2017   Actinic keratosis 08/22/2016   Carotid bruit 08/17/2015   Well adult exam 02/11/2015   Benign paroxysmal positional vertigo 04/06/2014   NUMBNESS, HAND 03/06/2007   Dyslipidemia 09/28/2006   Essential hypertension 09/28/2006   Allergic rhinitis 09/28/2006    DISORDER, 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   GERD 09/28/2006   Irritable bowel syndrome 09/28/2006   Osteoarthritis 09/28/2006   Arthropathy 09/28/2006   LOW BACK PAIN 09/28/2006   Past Medical History:  Diagnosis Date   Abnormal Pap smear of cervix    Allergy     Anxiety    Arthritis    Atrophic vaginitis    Bulging disc 10/2012   CERVICAL   Bursitis of hip    Cataract    mild starting of    Endometriosis    Family history of adverse reaction to anesthesia    daughter as teenager thrashing around after surgery   GERD (gastroesophageal reflux disease)    Hyperlipidemia    Hypertension    IBS (irritable bowel syndrome)    PONV (postoperative nausea and vomiting)    Past Surgical History:  Procedure Laterality Date   ABDOMINAL HYSTERECTOMY  11/1982   AUB & endometriosis, ovaries remain   APPENDECTOMY  09/1970   bilateral knee replacements      BREAST LUMPECTOMY WITH RADIOACTIVE SEED LOCALIZATION Right 04/29/2021   Procedure: RIGHT BREAST LUMPECTOMY WITH RADIOACTIVE SEED LOCALIZATION;  Surgeon: Curvin Deward MOULD, MD;  Location: [facility];  Service: General;  Laterality: Right;   BUNIONECTOMY Left 04/2004   left foot   CATARACT EXTRACTION     CERVIX LESION DESTRUCTION     COLONOSCOPY     CRYOTHERAPY     for abnormal pap smear   eyelid surgery Bilateral 2018   HYSTEROTOMY  01/1971   fetal demise, also BTL   LAPAROSCOPIC CHOLECYSTECTOMY  02/2005  POLYPECTOMY     TMJ ARTHROPLASTY  02/1993   secondary to hockey injury 15 years earlier   TUBAL LIGATION Bilateral 01/1971   UPPER GASTROINTESTINAL ENDOSCOPY       MEDICATIONS:   Medications Prior to Admission  Medication Sig Dispense Refill Last Dose/Taking   acetaminophen  (TYLENOL ) 500 MG tablet Take 500-1,000 mg by mouth See admin instructions. Take 2 tablets (1000 mg) by mouth scheduled every evening & may take 1-2 tablets (500-1,000 mg) by mouth every 6 hours if needed for pain/headaches.   06/29/2024 Evening    amLODipine  (NORVASC ) 5 MG tablet Take 1 tablet (5 mg total) by mouth daily. 90 tablet 3 06/30/2024 at  6:30 AM   aspirin 81 MG EC tablet Take 81 mg by mouth every evening. Take 1 by mouth daily   06/23/2024   atorvastatin  (LIPITOR) 80 MG tablet TAKE ONE TABLET BY MOUTH DAILY AT 6:00PM 90 tablet 3 06/29/2024   b complex vitamins tablet Take 1 tablet by mouth daily.   06/23/2024   Carboxymethylcellulose Sodium (THERATEARS) 0.25 % SOLN Place 1-2 drops into both eyes 3 (three) times daily as needed (dry/irritated eyes.).   06/29/2024   cetirizine  (ZYRTEC ) 10 MG tablet Take 10 mg by mouth every evening.   06/23/2024   Chlorpheniramine-DM (CORICIDIN COUGH/COLD) 4-30 MG TABS Take 1 tablet by mouth at bedtime.   06/23/2024   Cholecalciferol (VITAMIN D) 50 MCG (2000 UT) tablet Take 2,000 Units by mouth every Monday, Wednesday, and Friday.   06/23/2024   Coenzyme Q10 (COQ10) 100 MG CAPS Take 100 mg by mouth in the morning.   06/23/2024   dicyclomine  (BENTYL ) 10 MG capsule TAKE ONE CAPSULE BY MOUTH EVERY 6 TO 8 HOURS AS NEEDED FOR CRAMPING AND SPASMS 90 capsule 2 Past Month   DULoxetine  (CYMBALTA ) 30 MG capsule Take 1 capsule (30 mg total) by mouth daily. 90 capsule 3 06/30/2024 at  6:30 AM   esomeprazole (NEXIUM) 20 MG capsule Take 20 mg by mouth daily before breakfast.   06/30/2024 at  6:30 AM   fluticasone (FLONASE) 50 MCG/ACT nasal spray Place 1 spray into both nostrils in the morning.   06/30/2024 at  6:30 AM   furosemide  (LASIX ) 20 MG tablet TAKE 1/2 TO 1 TABLET BY MOUTH DAILY AS NEEDED FOR EDEMA 90 tablet 0 Taking   hydroxychloroquine (PLAQUENIL) 200 MG tablet Take 200 mg by mouth 2 (two) times daily.   06/30/2024 at  6:30 AM   LORazepam  (ATIVAN ) 1 MG tablet TAKE 1/2 TO 1 TABLET BY MOUTH TWO TIMES A DAY AS NEEDED FOR ANXIETY 30 tablet 3 06/29/2024 Bedtime   losartan  (COZAAR ) 100 MG tablet Take 1 tablet (100 mg total) by mouth daily. 90 tablet 3 06/29/2024   Tocilizumab  (ACTEMRA ) 200 MG/10ML SOLN Inject 200 mg into the  vein every 30 (thirty) days.   Past Month    ALLERGIES:   Allergies  Allergen Reactions   Ceftriaxone Sodium     Anaphylaxis shock Can take Amoxicillin ok   Levaquin [Levofloxacin] Anaphylaxis and Hives   Biaxin [Clarithromycin]     Chest pain   Sulfonamide Derivatives Nausea Only   Aspirin     Cramps and diarrhea   Benadryl [Diphenhydramine]     Not tolerating well   Codeine     nausea   Dairy Aid [Tilactase]    Gluten Meal    Molnupiravir      GI upset   Pneumovax 23 [Pneumococcal Vac Polyvalent]     Rash  Simvastatin      Leg pains    REVIEW OF SYSTEMS:  Pertinent items are noted in HPI.   FAMILY HISTORY:   Family History  Problem Relation Age of Onset   Diverticulitis Mother    Breast cancer Mother 11   Hypertension Mother    Heart disease Mother    Osteoarthritis Mother    CVA Mother    Hypercholesterolemia Mother    Colon polyps Mother    Heart disease Brother    Hypertension Brother    Lung cancer Brother 51       lung cancer, MI, ETOH   Heart disease Sister    Hypertension Sister    Osteoarthritis Sister    Hypertension Father    Hypertension Maternal Uncle    Heart disease Maternal Uncle    Hypertension Maternal Grandfather    Heart disease Maternal Grandfather    Colon cancer Neg Hx    Rectal cancer Neg Hx    Stomach cancer Neg Hx     SOCIAL HISTORY:   Social History   Tobacco Use   Smoking status: Never   Smokeless tobacco: Never  Substance Use Topics   Alcohol use: Not Currently     EXAMINATION:  Vital signs in last 24 hours: Temp:  [97.5 F (36.4 C)] 97.5 F (36.4 C) (10/06 0802) Pulse Rate:  [97] 97 (10/06 0802) Resp:  [15] 15 (10/06 0802) BP: (182)/(86) 182/86 (10/06 0802) SpO2:  [96 %] 96 % (10/06 0802) Weight:  [79 kg] 79 kg (10/06 0840)  BP (!) 182/86 (BP Location: Left Arm)   Pulse 97   Temp (!) 97.5 F (36.4 C) (Oral)   Resp 15   Ht 5' 0.5 (1.537 m)   Wt 79 kg   LMP 11/24/1982   SpO2 96%   BMI 33.45  kg/m   General Appearance:    Alert, cooperative, no distress, appears stated age  Head:    Normocephalic, without obvious abnormality, atraumatic  Eyes:    PERRL, conjunctiva/corneas clear, EOM's intact, fundi    benign, both eyes  Ears:    Normal TM's and external ear canals, both ears  Nose:   Nares normal, septum midline, mucosa normal, no drainage    or sinus tenderness  Throat:   Lips, mucosa, and tongue normal; teeth and gums normal  Neck:   Supple, symmetrical, trachea midline, no adenopathy;    thyroid :  no enlargement/tenderness/nodules; no carotid   bruit or JVD  Back:     Symmetric, no curvature, ROM normal, no CVA tenderness  Lungs:     Clear to auscultation bilaterally, respirations unlabored  Chest Wall:    No tenderness or deformity   Heart:    Regular rate and rhythm, S1 and S2 normal, no murmur, rub   or gallop  Breast Exam:    No tenderness, masses, or nipple abnormality  Abdomen:     Soft, non-tender, bowel sounds active all four quadrants,    no masses, no organomegaly  Genitalia:    Normal female without lesion, discharge or tenderness  Rectal:    Normal tone, normal prostate, no masses or tenderness;   guaiac negative stool  Extremities:   Extremities normal, atraumatic, no cyanosis or edema  Pulses:   2+ and symmetric all extremities  Skin:   Skin color, texture, turgor normal, no rashes or lesions  Lymph nodes:   Cervical, supraclavicular, and axillary nodes normal  Neurologic:   CNII-XII intact, normal strength, sensation and reflexes  throughout    Musculoskeletal:  ROM 0-110, Ligaments stable and feels somewhat tight. Imaging Review Plain radiographs demonstrate severe degenerative joint disease of the left knee. The overall alignment is neutral. The bone quality appears to be excellent for age and reported activity level.  Assessment/Plan: Painful left total knee replacement   The patient history, physical examination and imaging studies are  consistent with painful TKA of the left knee. The patient has failed conservative treatment.  The clearance notes were reviewed.  After discussion with the patient it was felt that Total Knee Replacement was indicated. The procedure,  risks, and benefits of total knee arthroplasty were presented and reviewed. The risks including but not limited to aseptic loosening, infection, blood clots, vascular injury, stiffness, patella tracking problems complications among others were discussed. The patient acknowledged the explanation, agreed to proceed with the plan.  Preoperative templating of the joint replacement has been completed, documented, and submitted to the Operating Room personnel in order to optimize intra-operative equipment management.    Patient's anticipated LOS is less than 2 midnights, meeting these requirements: - Younger than 54 - Lives within 1 hour of care - Has a competent adult at home to recover with post-op recover - NO history of  - Chronic pain requiring opiods  - Diabetes  - Coronary Artery Disease  - Heart failure  - Heart attack  - Stroke  - DVT/VTE  - Cardiac arrhythmia  - Respiratory Failure/COPD  - Renal failure  - Anemia  - Advanced Liver disease     Jennifer Davenport,STEPHEN D 06/30/2024, 9:39 AM

## 2024-06-30 NOTE — Anesthesia Postprocedure Evaluation (Signed)
 Anesthesia Post Note  Patient: Jennifer Davenport  Procedure(s) Performed: TOTAL KNEE REVISION (Left: Knee)     Patient location during evaluation: PACU Anesthesia Type: Spinal Level of consciousness: awake and alert Pain management: pain level controlled Vital Signs Assessment: post-procedure vital signs reviewed and stable Respiratory status: spontaneous breathing and respiratory function stable Cardiovascular status: blood pressure returned to baseline and stable Postop Assessment: spinal receding and no apparent nausea or vomiting Anesthetic complications: no   No notable events documented.  Last Vitals:  Vitals:   06/30/24 1400 06/30/24 1415  BP: (!) 159/95 (!) 176/107  Pulse: 82 78  Resp: 13 20  Temp:    SpO2: 98% 94%    Last Pain:  Vitals:   06/30/24 1400  TempSrc:   PainSc: 0-No pain                 Debby FORBES Like

## 2024-06-30 NOTE — Anesthesia Procedure Notes (Signed)
 Anesthesia Regional Block: Adductor canal block   Pre-Anesthetic Checklist: , timeout performed,  Correct Patient, Correct Site, Correct Laterality,  Correct Procedure, Correct Position, site marked,  Risks and benefits discussed,  Pre-op evaluation,  At surgeon's request and post-op pain management  Laterality: Left  Prep: Maximum Sterile Barrier Precautions used, chloraprep       Needles:  Injection technique: Single-shot  Needle Type: Echogenic Stimulator Needle     Needle Length: 9cm  Needle Gauge: 21     Additional Needles:   Procedures:,,,, ultrasound used (permanent image in chart),,    Narrative:  Start time: 06/30/2024 10:19 AM End time: 06/30/2024 10:22 AM Injection made incrementally with aspirations every 5 mL. Anesthesiologist: Niels Marien CROME, MD

## 2024-06-30 NOTE — Op Note (Addendum)
 TOTAL KNEE REPLACEMENT OPERATIVE NOTE:  06/30/2024  1:43 PM  PATIENT:  Jennifer Davenport  73 y.o. female  PRE-OPERATIVE DIAGNOSIS:  LEFT Failure of arthroplasty, initial encounter [facility] T84.019A  POST-OPERATIVE DIAGNOSIS:  LEFT Failure of arthroplasty, initial encounter HCC T84.019A  PROCEDURE:  Procedure(s): LEFT TOTAL KNEE REVISION  SURGEON:  Surgeon(s): Rubie Kemps, MD  PHYSICIAN ASSISTANT: Almeda Rummer, PA-C  ANESTHESIA:   spinal  SPECIMEN: None  COUNTS:  Correct  TOURNIQUET:   Total Tourniquet Time Documented: Thigh (Left) - 13 minutes Total: Thigh (Left) - 13 minutes   DICTATION:  Indication for procedure:    The patient is a 73 y.o. female who has failed conservative treatment for Failure of arthroplasty, initial encounter HCC T84.019A.  Informed consent was obtained prior to anesthesia. The risks versus benefits of the operation were explain and in a way the patient can, and did, understand.   Description of procedure:     The patient was taken to the operating room and placed under anesthesia.  The patient was positioned in the usual fashion taking care that all body parts were adequately padded and/or protected.  A tourniquet was applied and the leg prepped and draped in the usual sterile fashion.  The extremity was exsanguinated with the esmarch and tourniquet inflated to 350 mmHg.  Pre-operative range of motion was normal.  The knee was in 5 degree of neutral.  A midline incision approximately 6-7 inches long was made with a #10 blade.  A new blade was used to make a parapatellar arthrotomy going 2-3 cm into the quadriceps tendon, over the patella, and alongside the medial aspect of the patellar tendon.  A synovectomy was then performed with the #10 blade and forceps. I then elevated the deep MCL off the medial tibial metaphysis subperiosteally around to the semimembranosus attachment.    I removed all scar tissue from all compartments of the knee.  There was  a lot of fibrous tissue adhering from the quad to the femur.  The POLY was then removed and the patella subluxed and the knee brought into flexion.  I evaluated stability of the knee and felt that it was lax medially and laterally by about 1 mm medially 2 mm laterally.  The soft tissue was infiltrated with 60cc of exparel  1.3% through a 21 gauge needle.  I placed a new 7 mm polyethylene into the knee after evaluating the 6 versus the 7 mm.  The capsule was infilltrated with a 60cc exparel /marcaine /saline mixture.   Once the cement was hard, the tourniquet was let down.  Hemostasis was obtained.  The arthrotomy was closed using a #1 stratofix running suture.  The deep soft tissues were closed with #0 vicryls and the subcuticular layer closed with #2-0 vicryl.  The skin was reapproximated and closed with 3.0 Monocryl.  The wound was covered with steristrips, aquacel dressing, and a TED stocking.   The patient was then awakened, extubated, and taken to the recovery room in stable condition.  BLOOD LOSS:  300cc COMPLICATIONS:  None.  PLAN OF CARE: Discharge to home after PACU  PATIENT DISPOSITION:  PACU - hemodynamically stable.   Delay start of Pharmacological VTE agent (>24hrs) due to surgical blood loss or risk of bleeding:  not applicable  Please fax a copy of this op note to my office at [phone] (please only include page 1 and 2 of the Case Information op note)

## 2024-06-30 NOTE — Anesthesia Procedure Notes (Signed)
 Spinal  Patient location during procedure: OR Start time: 06/30/2024 10:57 AM End time: 06/30/2024 11:01 AM Reason for block: surgical anesthesia Staffing Performed: resident/CRNA  Resident/CRNA: Nanci Riis, CRNA Performed by: Nanci Riis, CRNA Authorized by: Lucious Debby BRAVO, MD   Preanesthetic Checklist Completed: patient identified, IV checked, site marked, risks and benefits discussed, surgical consent, monitors and equipment checked, pre-op evaluation and timeout performed Spinal Block Patient position: sitting Prep: DuraPrep Patient monitoring: heart rate, cardiac monitor, continuous pulse ox and blood pressure Approach: midline Location: L3-4 Injection technique: single-shot Needle Needle type: Sprotte  Needle gauge: 24 G Needle length: 9 cm Assessment Sensory level: T4 Events: CSF return

## 2024-06-30 NOTE — Transfer of Care (Signed)
 Immediate Anesthesia Transfer of Care Note  Patient: Jennifer Davenport  Procedure(s) Performed: TOTAL KNEE REVISION (Left: Knee)  Patient Location: PACU  Anesthesia Type:Spinal and MAC combined with regional for post-op pain  Level of Consciousness: drowsy and patient cooperative  Airway & Oxygen Therapy: Patient Spontanous Breathing  Post-op Assessment: Report given to RN and Post -op Vital signs reviewed and stable  Post vital signs: Reviewed and stable  Last Vitals:  Vitals Value Taken Time  BP 149/78 06/30/24 13:02  Temp    Pulse 101 06/30/24 13:05  Resp 24 06/30/24 13:05  SpO2 91 % 06/30/24 13:05  Vitals shown include unfiled device data.  Last Pain:  Vitals:   06/30/24 0840  TempSrc:   PainSc: 0-No pain         Complications: No notable events documented.

## 2024-06-30 NOTE — Evaluation (Signed)
 Physical Therapy Evaluation Patient Details Name: Jennifer Davenport MRN: [id number] DOB: 02-07-1951 Today's Date: 06/30/2024  History of Present Illness  73 yo female presents to therapy s/p L TKA revision on 06/30/2024 due to failure of conservative measures. Pt PMH includes but is not limited to: OA, peroneal tendinitis, trochanteric bursitis of R hip, anemia, CAD, anxiety, vertigo, HTN, LBP, GERD, HLD, IBS, TMJ arthoplasty, R breast lumpectomy, and L TKA (06/2021) and R TKA (01/2022).  Clinical Impression      Shavonda Wiedman is a 73 y.o. female POD 0 s/p L TKA. Patient reports IND with mobility at baseline. Patient is now limited by functional impairments (see PT problem list below) and requires CGA for transfers and gait with RW. Patient was able to ambulate 60 feet x 2  with RW and CGA and cues for safe walker management. Patient educated on safe sequencing for stair mobility with use of RW, fall risk prevention, slowly increasing activity, use of CPM, ice and CP, pain management and goal and car transfers pt and spouse verbalized understanding of safe guarding position for people assisting with mobility. Patient instructed in exercises to facilitate ROM and circulation reviewed and HO provided. Patient will benefit from continued skilled PT interventions to address impairments and progress towards PLOF. Patient has met mobility goals at adequate level for discharge home with family support and OPPT services scheduled for 10/20; will continue to follow if pt continues acute stay to progress towards Mod I goals.     If plan is discharge home, recommend the following: A little help with walking and/or transfers;A little help with bathing/dressing/bathroom;Assistance with cooking/housework;Assist for transportation   Can travel by private vehicle        Equipment Recommendations None recommended by PT  Recommendations for Other Services       Functional Status Assessment Patient has  had a recent decline in their functional status and demonstrates the ability to make significant improvements in function in a reasonable and predictable amount of time.     Precautions / Restrictions Precautions Precautions: Knee;Fall Restrictions Weight Bearing Restrictions Per Provider Order: No      Mobility  Bed Mobility Overal bed mobility: Needs Assistance Bed Mobility: Supine to Sit     Supine to sit: Supervision     General bed mobility comments: min cues HOB slightly elevated    Transfers Overall transfer level: Needs assistance Equipment used: Rolling walker (2 wheels) Transfers: Sit to/from Stand Sit to Stand: Contact guard assist           General transfer comment: min cues    Ambulation/Gait Ambulation/Gait assistance: Contact guard assist Gait Distance (Feet): 60 Feet Assistive device: Rolling walker (2 wheels) Gait Pattern/deviations: Step-to pattern, Decreased stance time - left, Antalgic, Trunk flexed Gait velocity: decreased     General Gait Details: slight trunk flexion with B UE support at RW and min cues for RW management, posture and safety  Stairs Stairs: Yes Stairs assistance: Contact guard assist Stair Management: Two rails Number of Stairs: 2 General stair comments: cues for safety and sequencing with step navigation and ed provided on use of RW to navigate non-sequential steps in home setting  Wheelchair Mobility     Tilt Bed    Modified Rankin (Stroke Patients Only)       Balance Overall balance assessment: Needs assistance Sitting-balance support: Feet supported Sitting balance-Leahy Scale: Good     Standing balance support: Bilateral upper extremity supported, During functional activity, Reliant on assistive device  for balance Standing balance-Leahy Scale: Fair Standing balance comment: static standing no UE support                             Pertinent Vitals/Pain Pain Assessment Pain Assessment:  0-10 Pain Score: 2  Pain Location: L knee and LE Pain Descriptors / Indicators: Aching, Constant, Discomfort, Operative site guarding Pain Intervention(s): Limited activity within patient's tolerance, Monitored during session, Premedicated before session, Repositioned, Ice applied    Home Living Family/patient expects to be discharged to:: Private residence Living Arrangements: Spouse/significant other Available Help at Discharge: Family Type of Home: House (townhome) Home Access: Stairs to enter Entrance Stairs-Rails: Right;Left;Can reach both Entrance Stairs-Number of Steps: threshold to enter and bridge with 2 steps to access front door   Home Layout: One level Home Equipment: Agricultural consultant (2 wheels);Cane - single point (trecking pole)      Prior Function Prior Level of Function : Independent/Modified Independent;Driving             Mobility Comments: IND no AD for all ADLs, slef care tasks and IADLs       Extremity/Trunk Assessment        Lower Extremity Assessment Lower Extremity Assessment: LLE deficits/detail LLE Deficits / Details: ankle DF/PF 5/5; SLR < 10 degree lag LLE Sensation: WNL    Cervical / Trunk Assessment Cervical / Trunk Assessment: Normal  Communication   Communication Communication: No apparent difficulties    Cognition Arousal: Alert Behavior During Therapy: WFL for tasks assessed/performed   PT - Cognitive impairments: No apparent impairments                         Following commands: Intact       Cueing       General Comments      Exercises Total Joint Exercises Ankle Circles/Pumps: AROM, Both, 10 reps Quad Sets: AROM, Left, 5 reps Short Arc Quad: AROM, Left, 5 reps Heel Slides: Left, 5 reps, AROM Hip ABduction/ADduction: AROM, Left, 5 reps Straight Leg Raises: AROM, Left, 5 reps Knee Flexion: AROM, Left, 5 reps, Seated   Assessment/Plan    PT Assessment Patient needs continued PT services  PT Problem  List Decreased strength;Decreased activity tolerance;Decreased range of motion;Decreased balance;Decreased mobility;Decreased coordination;Pain       PT Treatment Interventions DME instruction;Gait training;Stair training;Functional mobility training;Therapeutic activities;Therapeutic exercise;Balance training;Neuromuscular re-education;Patient/family education;Modalities    PT Goals (Current goals can be found in the Care Plan section)  Acute Rehab PT Goals Patient Stated Goal: to be able to get back out and hike PT Goal Formulation: With patient Time For Goal Achievement: 07/14/24 Potential to Achieve Goals: Good    Frequency 7X/week     Co-evaluation               AM-PAC PT 6 Clicks Mobility  Outcome Measure Help needed turning from your back to your side while in a flat bed without using bedrails?: None Help needed moving from lying on your back to sitting on the side of a flat bed without using bedrails?: A Little Help needed moving to and from a bed to a chair (including a wheelchair)?: A Little Help needed standing up from a chair using your arms (e.g., wheelchair or bedside chair)?: A Little Help needed to walk in hospital room?: A Little Help needed climbing 3-5 steps with a railing? : A Little 6 Click Score: 19    End  of Session Equipment Utilized During Treatment: Gait belt Activity Tolerance: Patient tolerated treatment well;No increased pain Patient left: in chair;with call bell/phone within reach;with family/visitor present Nurse Communication: Mobility status;Other (comment) (pt readiness for same day d/c from PT standpoint) PT Visit Diagnosis: Unsteadiness on feet (R26.81);Other abnormalities of gait and mobility (R26.89);Muscle weakness (generalized) (M62.81);Difficulty in walking, not elsewhere classified (R26.2);Pain Pain - Right/Left: Left Pain - part of body: Knee;Leg    Time: 8265-8194 PT Time Calculation (min) (ACUTE ONLY): 31 min   Charges:    PT Evaluation $PT Eval Low Complexity: 1 Low PT Treatments $Gait Training: 8-22 mins PT General Charges $$ ACUTE PT VISIT: 1 Visit         [place], PT Acute Rehab   [place] VEAR Drone 06/30/2024, 7:02 PM

## 2024-07-01 ENCOUNTER — Encounter (HOSPITAL_COMMUNITY): Payer: Self-pay | Admitting: Orthopedic Surgery

## 2024-07-10 DIAGNOSIS — M25562 Pain in left knee: Secondary | ICD-10-CM | POA: Diagnosis not present

## 2024-07-14 DIAGNOSIS — M1712 Unilateral primary osteoarthritis, left knee: Secondary | ICD-10-CM | POA: Diagnosis not present

## 2024-07-14 DIAGNOSIS — R262 Difficulty in walking, not elsewhere classified: Secondary | ICD-10-CM | POA: Diagnosis not present

## 2024-07-14 DIAGNOSIS — M6281 Muscle weakness (generalized): Secondary | ICD-10-CM | POA: Diagnosis not present

## 2024-07-16 DIAGNOSIS — M0609 Rheumatoid arthritis without rheumatoid factor, multiple sites: Secondary | ICD-10-CM | POA: Diagnosis not present

## 2024-07-17 DIAGNOSIS — M1712 Unilateral primary osteoarthritis, left knee: Secondary | ICD-10-CM | POA: Diagnosis not present

## 2024-07-17 DIAGNOSIS — R262 Difficulty in walking, not elsewhere classified: Secondary | ICD-10-CM | POA: Diagnosis not present

## 2024-07-17 DIAGNOSIS — M6281 Muscle weakness (generalized): Secondary | ICD-10-CM | POA: Diagnosis not present

## 2024-07-22 DIAGNOSIS — M6281 Muscle weakness (generalized): Secondary | ICD-10-CM | POA: Diagnosis not present

## 2024-07-22 DIAGNOSIS — R262 Difficulty in walking, not elsewhere classified: Secondary | ICD-10-CM | POA: Diagnosis not present

## 2024-07-22 DIAGNOSIS — M1712 Unilateral primary osteoarthritis, left knee: Secondary | ICD-10-CM | POA: Diagnosis not present

## 2024-07-24 DIAGNOSIS — R262 Difficulty in walking, not elsewhere classified: Secondary | ICD-10-CM | POA: Diagnosis not present

## 2024-07-24 DIAGNOSIS — M1712 Unilateral primary osteoarthritis, left knee: Secondary | ICD-10-CM | POA: Diagnosis not present

## 2024-07-24 DIAGNOSIS — M6281 Muscle weakness (generalized): Secondary | ICD-10-CM | POA: Diagnosis not present

## 2024-07-28 DIAGNOSIS — M6281 Muscle weakness (generalized): Secondary | ICD-10-CM | POA: Diagnosis not present

## 2024-07-28 DIAGNOSIS — M1712 Unilateral primary osteoarthritis, left knee: Secondary | ICD-10-CM | POA: Diagnosis not present

## 2024-07-28 DIAGNOSIS — R262 Difficulty in walking, not elsewhere classified: Secondary | ICD-10-CM | POA: Diagnosis not present

## 2024-07-28 NOTE — Discharge Summary (Signed)
 This patient was discharged same day. No discharge summary required. Marcey Raman, MD

## 2024-07-31 DIAGNOSIS — M6281 Muscle weakness (generalized): Secondary | ICD-10-CM | POA: Diagnosis not present

## 2024-07-31 DIAGNOSIS — M1712 Unilateral primary osteoarthritis, left knee: Secondary | ICD-10-CM | POA: Diagnosis not present

## 2024-07-31 DIAGNOSIS — R262 Difficulty in walking, not elsewhere classified: Secondary | ICD-10-CM | POA: Diagnosis not present

## 2024-08-07 DIAGNOSIS — R262 Difficulty in walking, not elsewhere classified: Secondary | ICD-10-CM | POA: Diagnosis not present

## 2024-08-07 DIAGNOSIS — M1712 Unilateral primary osteoarthritis, left knee: Secondary | ICD-10-CM | POA: Diagnosis not present

## 2024-08-07 DIAGNOSIS — M6281 Muscle weakness (generalized): Secondary | ICD-10-CM | POA: Diagnosis not present

## 2024-08-12 DIAGNOSIS — R262 Difficulty in walking, not elsewhere classified: Secondary | ICD-10-CM | POA: Diagnosis not present

## 2024-08-12 DIAGNOSIS — M1712 Unilateral primary osteoarthritis, left knee: Secondary | ICD-10-CM | POA: Diagnosis not present

## 2024-08-12 DIAGNOSIS — M6281 Muscle weakness (generalized): Secondary | ICD-10-CM | POA: Diagnosis not present

## 2024-08-14 DIAGNOSIS — M0609 Rheumatoid arthritis without rheumatoid factor, multiple sites: Secondary | ICD-10-CM | POA: Diagnosis not present

## 2024-08-18 DIAGNOSIS — M6281 Muscle weakness (generalized): Secondary | ICD-10-CM | POA: Diagnosis not present

## 2024-08-18 DIAGNOSIS — R262 Difficulty in walking, not elsewhere classified: Secondary | ICD-10-CM | POA: Diagnosis not present

## 2024-08-18 DIAGNOSIS — M1712 Unilateral primary osteoarthritis, left knee: Secondary | ICD-10-CM | POA: Diagnosis not present

## 2024-08-26 DIAGNOSIS — M6281 Muscle weakness (generalized): Secondary | ICD-10-CM | POA: Diagnosis not present

## 2024-08-26 DIAGNOSIS — M1712 Unilateral primary osteoarthritis, left knee: Secondary | ICD-10-CM | POA: Diagnosis not present

## 2024-08-26 DIAGNOSIS — R262 Difficulty in walking, not elsewhere classified: Secondary | ICD-10-CM | POA: Diagnosis not present

## 2024-09-01 ENCOUNTER — Other Ambulatory Visit (HOSPITAL_BASED_OUTPATIENT_CLINIC_OR_DEPARTMENT_OTHER): Payer: Self-pay

## 2024-09-01 DIAGNOSIS — Z23 Encounter for immunization: Secondary | ICD-10-CM | POA: Diagnosis not present

## 2024-09-01 MED ORDER — FLUZONE HIGH-DOSE 0.5 ML IM SUSY
0.5000 mL | PREFILLED_SYRINGE | Freq: Once | INTRAMUSCULAR | 0 refills | Status: AC
  Filled 2024-09-01: qty 0.5, 1d supply, fill #0

## 2024-09-08 ENCOUNTER — Ambulatory Visit (INDEPENDENT_AMBULATORY_CARE_PROVIDER_SITE_OTHER)

## 2024-09-08 VITALS — Ht 65.0 in | Wt 174.0 lb

## 2024-09-08 DIAGNOSIS — Z0000 Encounter for general adult medical examination without abnormal findings: Secondary | ICD-10-CM | POA: Diagnosis not present

## 2024-09-08 NOTE — Patient Instructions (Addendum)
 Ms. Hanssen,  Thank you for taking the time for your Medicare Wellness Visit. I appreciate your continued commitment to your health goals. Please review the care plan we discussed, and feel free to reach out if I can assist you further.  Please note that Annual Wellness Visits do not include a physical exam. Some assessments may be limited, especially if the visit was conducted virtually. If needed, we may recommend an in-person follow-up with your provider.  Ongoing Care Seeing your primary care provider every 3 to 6 months helps us  monitor your health and provide consistent, personalized care.   Referrals If a referral was made during today's visit and you haven't received any updates within two weeks, please contact the referred provider directly to check on the status.  Recommended Screenings:  Health Maintenance  Topic Date Due   Complete foot exam   Never done   Yearly kidney health urinalysis for diabetes  Never done   Hemoglobin A1C  12/26/2021   Breast Cancer Screening  06/06/2024   Eye exam for diabetics  06/10/2024   COVID-19 Vaccine (9 - Pfizer risk 2025-26 season) 12/07/2024   Yearly kidney function blood test for diabetes  06/30/2025   Medicare Annual Wellness Visit  09/08/2025   Colon Cancer Screening  05/08/2026   DTaP/Tdap/Td vaccine (4 - Td or Tdap) 01/21/2033   Pneumococcal Vaccine for age over 62  Completed   Flu Shot  Completed   Osteoporosis screening with Bone Density Scan  Completed   Hepatitis C Screening  Completed   Zoster (Shingles) Vaccine  Completed   Meningitis B Vaccine  Aged Out       09/08/2024    8:55 AM  Advanced Directives  Does Patient Have a Medical Advance Directive? Yes  Type of Estate Agent of [place];Living will  Does patient want to make changes to medical advance directive? Yes (Inpatient - patient requests chaplain consult to change a medical advance directive)  Copy of Healthcare Power of Attorney in Chart?  No - copy requested    Vision: Annual vision screenings are recommended for early detection of glaucoma, cataracts, and diabetic retinopathy. These exams can also reveal signs of chronic conditions such as diabetes and high blood pressure.  Dental: Annual dental screenings help detect early signs of oral cancer, gum disease, and other conditions linked to overall health, including heart disease and diabetes.

## 2024-09-08 NOTE — Progress Notes (Addendum)
 Chief Complaint  Patient presents with   Medicare Wellness     Subjective:  Please attest and cosign this visit due to patients primary care provider not being in the office at the time the visit was completed.  (Pt of Dr A. Plotnikov)   Jennifer Davenport is a 73 y.o. female who presents for a Medicare Annual Wellness Visit.  Visit info / Clinical Intake: Medicare Wellness Visit Type:: Subsequent Annual Wellness Visit Persons participating in visit and providing information:: patient Medicare Wellness Visit Mode:: Telephone If telephone:: video declined Since this visit was completed virtually, some vitals may be partially provided or unavailable. Missing vitals are due to the limitations of the virtual format.: Documented vitals are patient reported If Telephone or Video please confirm:: I connected with patient using audio/video enable telemedicine. I verified patient identity with two identifiers, discussed telehealth limitations, and patient agreed to proceed. Patient Location:: Home Provider Location:: Office Interpreter Needed?: No Pre-visit prep was completed: yes AWV questionnaire completed by patient prior to visit?: yes Date:: 09/04/24 Living arrangements:: lives with spouse/significant other Patient's Overall Health Status Rating: good Typical amount of pain: some Does pain affect daily life?: no Are you currently prescribed opioids?: no  Dietary Habits and Nutritional Risks How many meals a day?: 3 Eats fruit and vegetables daily?: (!) no Most meals are obtained by: preparing own meals; eating out In the last 2 weeks, have you had any of the following?: none Diabetic:: no  Functional Status Activities of Daily Living (to include ambulation/medication): Independent Ambulation: Independent Medication Administration: Independent Home Management (perform basic housework or laundry): Independent Manage your own finances?: yes Primary transportation is:  driving Concerns about vision?: no *vision screening is required for WTM* Concerns about hearing?: no  Fall Screening Falls in the past year?: 1 Number of falls in past year: 0 (1) Was there an injury with Fall?: 1 (left knee fracture) Fall Risk Category Calculator: 2 Patient Fall Risk Level: Moderate Fall Risk  Fall Risk Patient at Risk for Falls Due to: Other (Comment) (1 fall) Fall risk Follow up: Falls evaluation completed; Falls prevention discussed  Home and Transportation Safety: All rugs have non-skid backing?: yes All stairs or steps have railings?: N/A, no stairs Grab bars in the bathtub or shower?: yes Have non-skid surface in bathtub or shower?: yes Good home lighting?: yes Regular seat belt use?: yes Hospital stays in the last year:: no  Cognitive Assessment Difficulty concentrating, remembering, or making decisions? : no Will 6CIT or Mini Cog be Completed: yes What year is it?: 0 points What month is it?: 0 points Give patient an address phrase to remember (5 components): [address] [place], [place] About what time is it?: 0 points Count backwards from 20 to 1: 0 points Say the months of the year in reverse: 0 points Repeat the address phrase from earlier: 0 points 6 CIT Score: 0 points  Advance Directives (For Healthcare) Does Patient Have a Medical Advance Directive?: Yes Does patient want to make changes to medical advance directive?: Yes (Inpatient - patient requests chaplain consult to change a medical advance directive) Type of Advance Directive: Healthcare Power of [place]; Living will Copy of Healthcare Power of Attorney in Chart?: No - copy requested Copy of Living Will in Chart?: No - copy requested  Reviewed/Updated  Reviewed/Updated: Reviewed All (Medical, Surgical, Family, Medications, Allergies, Care Teams, Patient Goals)    Allergies (verified) Ceftriaxone sodium, Levaquin [levofloxacin], Biaxin [clarithromycin], Sulfonamide derivatives,  Aspirin, Benadryl [diphenhydramine], Codeine,  Dairy aid [tilactase], Gluten meal, Molnupiravir , Pneumovax 23 [pneumococcal vac polyvalent], and Simvastatin    Current Medications (verified) Outpatient Encounter Medications as of 09/08/2024  Medication Sig   amLODipine  (NORVASC ) 5 MG tablet Take 1 tablet (5 mg total) by mouth daily.   atorvastatin  (LIPITOR) 80 MG tablet TAKE ONE TABLET BY MOUTH DAILY AT 6:00PM   b complex vitamins tablet Take 1 tablet by mouth daily.   Carboxymethylcellulose Sodium (THERATEARS) 0.25 % SOLN Place 1-2 drops into both eyes 3 (three) times daily as needed (dry/irritated eyes.).   cetirizine  (ZYRTEC ) 10 MG tablet Take 10 mg by mouth every evening.   Chlorpheniramine-DM (CORICIDIN COUGH/COLD) 4-30 MG TABS Take 1 tablet by mouth at bedtime.   Cholecalciferol (VITAMIN D ) 50 MCG (2000 UT) tablet Take 2,000 Units by mouth every Monday, Wednesday, and Friday.   Coenzyme Q10 (COQ10) 100 MG CAPS Take 100 mg by mouth in the morning.   dicyclomine  (BENTYL ) 10 MG capsule TAKE ONE CAPSULE BY MOUTH EVERY 6 TO 8 HOURS AS NEEDED FOR CRAMPING AND SPASMS   docusate sodium  (COLACE) 100 MG capsule Take 1 capsule (100 mg total) by mouth daily as needed.   DULoxetine  (CYMBALTA ) 30 MG capsule Take 1 capsule (30 mg total) by mouth daily.   esomeprazole (NEXIUM) 20 MG capsule Take 20 mg by mouth daily before breakfast.   fluticasone (FLONASE) 50 MCG/ACT nasal spray Place 1 spray into both nostrils in the morning.   furosemide  (LASIX ) 20 MG tablet TAKE 1/2 TO 1 TABLET BY MOUTH DAILY AS NEEDED FOR EDEMA   hydroxychloroquine (PLAQUENIL) 200 MG tablet Take 200 mg by mouth 2 (two) times daily.   LORazepam  (ATIVAN ) 1 MG tablet TAKE 1/2 TO 1 TABLET BY MOUTH TWO TIMES A DAY AS NEEDED FOR ANXIETY   losartan  (COZAAR ) 100 MG tablet Take 1 tablet (100 mg total) by mouth daily.   methocarbamol  (ROBAXIN ) 500 MG tablet Take 1 tablet (500 mg total) by mouth 4 (four) times daily.   ondansetron  (ZOFRAN -ODT)  4 MG disintegrating tablet Take 1 tablet (4 mg total) by mouth every 8 (eight) hours as needed for nausea or vomiting.   Tocilizumab  (ACTEMRA ) 200 MG/10ML SOLN Inject 200 mg into the vein every 30 (thirty) days.   oxyCODONE  (OXY IR/ROXICODONE ) 5 MG immediate release tablet Take 1 tablet (5 mg total) by mouth every 4 (four) hours as needed for severe pain (pain score 7-10). (Patient not taking: Reported on 09/08/2024)   No facility-administered encounter medications on file as of 09/08/2024.    History: Past Medical History:  Diagnosis Date   Abnormal Pap smear of cervix    Allergy     Anxiety    Arthritis    Atrophic vaginitis    Bulging disc 10/2012   CERVICAL   Bursitis of hip    Cataract    mild starting of    Endometriosis    Family history of adverse reaction to anesthesia    daughter as teenager thrashing around after surgery   GERD (gastroesophageal reflux disease)    Hyperlipidemia    Hypertension    IBS (irritable bowel syndrome)    PONV (postoperative nausea and vomiting)    Past Surgical History:  Procedure Laterality Date   ABDOMINAL HYSTERECTOMY  11/1982   AUB & endometriosis, ovaries remain   APPENDECTOMY  09/1970   bilateral knee replacements      BREAST LUMPECTOMY WITH RADIOACTIVE SEED LOCALIZATION Right 04/29/2021   Procedure: RIGHT BREAST LUMPECTOMY WITH RADIOACTIVE SEED LOCALIZATION;  Surgeon: Curvin,  Deward MOULD, MD;  Location: [facility];  Service: General;  Laterality: Right;   BUNIONECTOMY Left 04/2004   left foot   CATARACT EXTRACTION     CERVIX LESION DESTRUCTION     COLONOSCOPY     CRYOTHERAPY     for abnormal pap smear   eyelid surgery Bilateral 2018   HYSTEROTOMY  01/1971   fetal demise, also BTL   LAPAROSCOPIC CHOLECYSTECTOMY  02/2005   POLYPECTOMY     TMJ ARTHROPLASTY  02/1993   secondary to hockey injury 15 years earlier   TOTAL KNEE REVISION Left 06/30/2024   Procedure: TOTAL KNEE REVISION;  Surgeon: Rubie Kemps, MD;   Location: [facility];  Service: Orthopedics;  Laterality: Left;  Left Knee Polyethylene Exchange   TUBAL LIGATION Bilateral 01/1971   UPPER GASTROINTESTINAL ENDOSCOPY     Family History  Problem Relation Age of Onset   Diverticulitis Mother    Breast cancer Mother 53   Hypertension Mother    Heart disease Mother    Osteoarthritis Mother    CVA Mother    Hypercholesterolemia Mother    Colon polyps Mother    Heart disease Brother    Hypertension Brother    Lung cancer Brother 66       lung cancer, MI, ETOH   Heart disease Sister    Hypertension Sister    Osteoarthritis Sister    Hypertension Father    Hypertension Maternal Uncle    Heart disease Maternal Uncle    Hypertension Maternal Grandfather    Heart disease Maternal Grandfather    Colon cancer Neg Hx    Rectal cancer Neg Hx    Stomach cancer Neg Hx    Social History   Occupational History   Not on file  Tobacco Use   Smoking status: Never   Smokeless tobacco: Never  Vaping Use   Vaping status: Never Used  Substance and Sexual Activity   Alcohol use: Not Currently    Alcohol/week: 1.0 standard drink of alcohol    Types: 1 Glasses of wine per week    Comment: socially   Drug use: No   Sexual activity: Yes    Birth control/protection: Surgical    Comment: hysterectomy    Tobacco Counseling Counseling given: Not Answered  SDOH Screenings   Food Insecurity: No Food Insecurity (09/08/2024)  Housing: Unknown (09/08/2024)  Transportation Needs: No Transportation Needs (09/08/2024)  Utilities: Not At Risk (09/08/2024)  Alcohol Screen: Low Risk (06/19/2022)  Depression (PHQ2-9): Low Risk (09/08/2024)  Financial Resource Strain: Low Risk (09/04/2024)  Physical Activity: Insufficiently Active (09/08/2024)  Social Connections: Socially Integrated (09/08/2024)  Stress: No Stress Concern Present (09/08/2024)  Tobacco Use: Low Risk (09/08/2024)  Health Literacy: Adequate Health Literacy (09/08/2024)   See flowsheets  for full screening details  Depression Screen PHQ 2 & 9 Depression Scale- Over the past 2 weeks, how often have you been bothered by any of the following problems? Little interest or pleasure in doing things: 0 Feeling down, depressed, or hopeless (PHQ Adolescent also includes...irritable): 0 PHQ-2 Total Score: 0 Trouble falling or staying asleep, or sleeping too much: 0 Feeling tired or having little energy: 0 Poor appetite or overeating (PHQ Adolescent also includes...weight loss): 0 Feeling bad about yourself - or that you are a failure or have let yourself or your family down: 0 Trouble concentrating on things, such as reading the newspaper or watching television (PHQ Adolescent also includes...like school work): 0 Moving or speaking so slowly that other  people could have noticed. Or the opposite - being so fidgety or restless that you have been moving around a lot more than usual: 1 (left knee healing) Thoughts that you would be better off dead, or of hurting yourself in some way: 0 PHQ-9 Total Score: 1 If you checked off any problems, how difficult have these problems made it for you to do your work, take care of things at home, or get along with other people?: Not difficult at all  Depression Treatment Depression Interventions/Treatment : EYV7-0 Score <4 Follow-up Not Indicated     Goals Addressed               This Visit's Progress     Patient Stated (pt-stated)        Patient stated she plans to continue healing of left knee surgery             Objective:    Today's Vitals   09/08/24 0851  Weight: 174 lb (78.9 kg)  Height: 5' 5 (1.651 m)   Body mass index is 28.96 kg/m.  Hearing/Vision screen Hearing Screening - Comments:: Denies hearing difficulties   Vision Screening - Comments:: Denies vision concerns - up-to-update exams with Arlyss King Immunizations and Health Maintenance Health Maintenance  Topic Date Due   FOOT EXAM  Never done   Diabetic  kidney evaluation - Urine ACR  Never done   HEMOGLOBIN A1C  12/26/2021   Mammogram  06/06/2024   OPHTHALMOLOGY EXAM  06/10/2024   COVID-19 Vaccine (9 - Pfizer risk 2025-26 season) 12/07/2024   Diabetic kidney evaluation - eGFR measurement  06/30/2025   Medicare Annual Wellness (AWV)  09/08/2025   Colonoscopy  05/08/2026   DTaP/Tdap/Td (4 - Td or Tdap) 01/21/2033   Pneumococcal Vaccine: 50+ Years  Completed   Influenza Vaccine  Completed   Bone Density Scan  Completed   Hepatitis C Screening  Completed   Zoster Vaccines- Shingrix   Completed   Meningococcal B Vaccine  Aged Out        Assessment/Plan:  This is a routine wellness examination for Yolande.  Patient Care Team: Plotnikov, Karlynn GAILS, MD as PCP - General (Internal Medicine) Burnard Debby LABOR, MD (Inactive) as PCP - Cardiology (Cardiology) Ishmael Slough, MD as Consulting Physician (Rheumatology) Gerome Charleston, MD (Inactive) as Consulting Physician (Orthopedic Surgery) King Arlyss, MD as Consulting Physician (Ophthalmology) Latisha Medford, MD as Consulting Physician (Obstetrics and Gynecology)  I have personally reviewed and noted the following in the patients chart:   Medical and social history Use of alcohol, tobacco or illicit drugs  Current medications and supplements including opioid prescriptions. Functional ability and status Nutritional status Physical activity Advanced directives List of other physicians Hospitalizations, surgeries, and ER visits in previous 12 months Vitals Screenings to include cognitive, depression, and falls Referrals and appointments  No orders of the defined types were placed in this encounter.  In addition, I have reviewed and discussed with patient certain preventive protocols, quality metrics, and best practice recommendations. A written personalized care plan for preventive services as well as general preventive health recommendations were provided to patient.   Verdie CHRISTELLA Saba, CMA   09/08/2024   Return in 1 year (on 09/08/2025).  After Visit Summary: (MyChart) Due to this being a telephonic visit, the after visit summary with patients personalized plan was offered to patient via MyChart   Nurse Notes: scheduled 2026 AWV/CPE appts

## 2024-09-11 ENCOUNTER — Ambulatory Visit: Payer: Self-pay | Admitting: Internal Medicine

## 2024-09-11 ENCOUNTER — Ambulatory Visit (INDEPENDENT_AMBULATORY_CARE_PROVIDER_SITE_OTHER): Admitting: Internal Medicine

## 2024-09-11 ENCOUNTER — Encounter: Payer: Self-pay | Admitting: Internal Medicine

## 2024-09-11 VITALS — BP 134/86 | HR 87 | Ht 65.0 in | Wt 174.0 lb

## 2024-09-11 DIAGNOSIS — R5383 Other fatigue: Secondary | ICD-10-CM | POA: Diagnosis not present

## 2024-09-11 DIAGNOSIS — M25462 Effusion, left knee: Secondary | ICD-10-CM | POA: Diagnosis not present

## 2024-09-11 DIAGNOSIS — D539 Nutritional anemia, unspecified: Secondary | ICD-10-CM

## 2024-09-11 DIAGNOSIS — D649 Anemia, unspecified: Secondary | ICD-10-CM | POA: Diagnosis not present

## 2024-09-11 DIAGNOSIS — M25562 Pain in left knee: Secondary | ICD-10-CM | POA: Diagnosis not present

## 2024-09-11 DIAGNOSIS — E119 Type 2 diabetes mellitus without complications: Secondary | ICD-10-CM | POA: Diagnosis not present

## 2024-09-11 DIAGNOSIS — F419 Anxiety disorder, unspecified: Secondary | ICD-10-CM

## 2024-09-11 DIAGNOSIS — K9041 Non-celiac gluten sensitivity: Secondary | ICD-10-CM | POA: Diagnosis not present

## 2024-09-11 DIAGNOSIS — M0609 Rheumatoid arthritis without rheumatoid factor, multiple sites: Secondary | ICD-10-CM | POA: Diagnosis not present

## 2024-09-11 LAB — CBC WITH DIFFERENTIAL/PLATELET
Basophils Absolute: 0.1 K/uL (ref 0.0–0.1)
Basophils Relative: 1.2 % (ref 0.0–3.0)
Eosinophils Absolute: 0.2 K/uL (ref 0.0–0.7)
Eosinophils Relative: 3.6 % (ref 0.0–5.0)
HCT: 33.8 % — ABNORMAL LOW (ref 36.0–46.0)
Hemoglobin: 11.2 g/dL — ABNORMAL LOW (ref 12.0–15.0)
Lymphocytes Relative: 38.6 % (ref 12.0–46.0)
Lymphs Abs: 1.7 K/uL (ref 0.7–4.0)
MCHC: 33.1 g/dL (ref 30.0–36.0)
MCV: 84.6 fl (ref 78.0–100.0)
Monocytes Absolute: 0.5 K/uL (ref 0.1–1.0)
Monocytes Relative: 11.4 % (ref 3.0–12.0)
Neutro Abs: 1.9 K/uL (ref 1.4–7.7)
Neutrophils Relative %: 45.2 % (ref 43.0–77.0)
Platelets: 218 K/uL (ref 150.0–400.0)
RBC: 3.99 Mil/uL (ref 3.87–5.11)
RDW: 14.1 % (ref 11.5–15.5)
WBC: 4.3 K/uL (ref 4.0–10.5)

## 2024-09-11 LAB — VITAMIN B12: Vitamin B-12: 710 pg/mL (ref 211–911)

## 2024-09-11 LAB — VITAMIN D 25 HYDROXY (VIT D DEFICIENCY, FRACTURES): VITD: 27.5 ng/mL — ABNORMAL LOW (ref 30.00–100.00)

## 2024-09-11 NOTE — Assessment & Plan Note (Signed)
 Worse.  Will increase Cymbalta to 30 mg daily

## 2024-09-11 NOTE — Assessment & Plan Note (Signed)
 Probably multifactorial, anemia of chronic disease  S/p surgery - Jun 30 2024: left knee total knee arthroplasty redo by Dr. Dorian under spinal anesthesia  Dr Ishmael did labs Hgb 10.6, MCV 89 - Mid Nov 2025 On Hydroxychloroquine, and Actemra  once a month - Dr [facility] Hematology ref is offered

## 2024-09-11 NOTE — Progress Notes (Signed)
 Subjective:  Patient ID: Jennifer Davenport, female    DOB: 11/20/1950  Age: 73 y.o. MRN: [id number]  CC: Medical Management of Chronic Issues (6 Month follow up. Concerns with hematocrit and hemaglobin levels steadily decreasing)   HPI Jennifer Davenport presents for anemia Recent Surgery - Jun 30 2024 Dr Ishmael did labs Hgb 10.6, MCV 89 - Mid Nov 2025   Outpatient Medications Prior to Visit  Medication Sig Dispense Refill   amLODipine  (NORVASC ) 5 MG tablet Take 1 tablet (5 mg total) by mouth daily. 90 tablet 3   atorvastatin  (LIPITOR) 80 MG tablet TAKE ONE TABLET BY MOUTH DAILY AT 6:00PM 90 tablet 3   b complex vitamins tablet Take 1 tablet by mouth daily.     Carboxymethylcellulose Sodium (THERATEARS) 0.25 % SOLN Place 1-2 drops into both eyes 3 (three) times daily as needed (dry/irritated eyes.).     cetirizine  (ZYRTEC ) 10 MG tablet Take 10 mg by mouth every evening.     Chlorpheniramine-DM (CORICIDIN COUGH/COLD) 4-30 MG TABS Take 1 tablet by mouth at bedtime.     Cholecalciferol (VITAMIN D ) 50 MCG (2000 UT) tablet Take 2,000 Units by mouth every Monday, Wednesday, and Friday.     Coenzyme Q10 (COQ10) 100 MG CAPS Take 100 mg by mouth in the morning.     dicyclomine  (BENTYL ) 10 MG capsule TAKE ONE CAPSULE BY MOUTH EVERY 6 TO 8 HOURS AS NEEDED FOR CRAMPING AND SPASMS 90 capsule 2   docusate sodium  (COLACE) 100 MG capsule Take 1 capsule (100 mg total) by mouth daily as needed. 30 capsule 2   DULoxetine  (CYMBALTA ) 30 MG capsule Take 1 capsule (30 mg total) by mouth daily. 90 capsule 3   esomeprazole (NEXIUM) 20 MG capsule Take 20 mg by mouth daily before breakfast.     fluticasone (FLONASE) 50 MCG/ACT nasal spray Place 1 spray into both nostrils in the morning.     furosemide  (LASIX ) 20 MG tablet TAKE 1/2 TO 1 TABLET BY MOUTH DAILY AS NEEDED FOR EDEMA 90 tablet 0   hydroxychloroquine (PLAQUENIL) 200 MG tablet Take 200 mg by mouth 2 (two) times daily.     LORazepam  (ATIVAN ) 1 MG  tablet TAKE 1/2 TO 1 TABLET BY MOUTH TWO TIMES A DAY AS NEEDED FOR ANXIETY 30 tablet 3   losartan  (COZAAR ) 100 MG tablet Take 1 tablet (100 mg total) by mouth daily. 90 tablet 3   methocarbamol  (ROBAXIN ) 500 MG tablet Take 1 tablet (500 mg total) by mouth 4 (four) times daily. 30 tablet 0   ondansetron  (ZOFRAN -ODT) 4 MG disintegrating tablet Take 1 tablet (4 mg total) by mouth every 8 (eight) hours as needed for nausea or vomiting. 20 tablet 0   Tocilizumab  (ACTEMRA ) 200 MG/10ML SOLN Inject 200 mg into the vein every 30 (thirty) days.     oxyCODONE  (OXY IR/ROXICODONE ) 5 MG immediate release tablet Take 1 tablet (5 mg total) by mouth every 4 (four) hours as needed for severe pain (pain score 7-10). (Patient not taking: Reported on 09/11/2024) 30 tablet 0   No facility-administered medications prior to visit.    ROS: Review of Systems  Constitutional:  Positive for fatigue. Negative for activity change, appetite change, chills and unexpected weight change.  HENT:  Negative for congestion, mouth sores and sinus pressure.   Eyes:  Negative for visual disturbance.  Respiratory:  Negative for cough and chest tightness.   Gastrointestinal:  Negative for abdominal pain and nausea.  Genitourinary:  Negative for difficulty urinating,  frequency and vaginal pain.  Musculoskeletal:  Positive for arthralgias. Negative for back pain and gait problem.  Skin:  Negative for pallor and rash.  Neurological:  Negative for dizziness, tremors, weakness, numbness and headaches.  Psychiatric/Behavioral:  Negative for confusion, sleep disturbance and suicidal ideas.     Objective:  BP 134/86   Pulse 87   Ht 5' 5 (1.651 m)   Wt 174 lb (78.9 kg)   LMP 11/24/1982   SpO2 96%   BMI 28.96 kg/m   BP Readings from Last 3 Encounters:  09/11/24 134/86  06/30/24 (!) 156/96  06/17/24 (!) 144/97    Wt Readings from Last 3 Encounters:  09/11/24 174 lb (78.9 kg)  09/08/24 174 lb (78.9 kg)  06/30/24 174 lb 2.6 oz  (79 kg)    Physical Exam Constitutional:      General: She is not in acute distress.    Appearance: She is well-developed. She is obese.  HENT:     Head: Normocephalic.     Right Ear: External ear normal.     Left Ear: External ear normal.     Nose: Nose normal.  Eyes:     General:        Right eye: No discharge.        Left eye: No discharge.     Conjunctiva/sclera: Conjunctivae normal.     Pupils: Pupils are equal, round, and reactive to light.  Neck:     Thyroid : No thyromegaly.     Vascular: No JVD.     Trachea: No tracheal deviation.  Cardiovascular:     Rate and Rhythm: Normal rate and regular rhythm.     Heart sounds: Normal heart sounds.  Pulmonary:     Effort: No respiratory distress.     Breath sounds: No stridor. No wheezing.  Abdominal:     General: Bowel sounds are normal. There is no distension.     Palpations: Abdomen is soft. There is no mass.     Tenderness: There is no abdominal tenderness. There is no guarding or rebound.  Musculoskeletal:        General: Tenderness present.     Cervical back: Normal range of motion and neck supple. No rigidity.  Lymphadenopathy:     Cervical: No cervical adenopathy.  Skin:    Findings: No erythema or rash.  Neurological:     Cranial Nerves: No cranial nerve deficit.     Motor: No abnormal muscle tone.     Coordination: Coordination normal.     Deep Tendon Reflexes: Reflexes normal.  Psychiatric:        Behavior: Behavior normal.        Thought Content: Thought content normal.        Judgment: Judgment normal.     Lab Results  Component Value Date   WBC 4.1 06/30/2024   HGB 11.1 (L) 06/30/2024   HCT 37.1 06/30/2024   PLT 212 06/30/2024   GLUCOSE 115 (H) 06/30/2024   CHOL 131 09/12/2023   TRIG 70.0 09/12/2023   HDL 81.40 09/12/2023   LDLCALC 36 09/12/2023   ALT 57 (H) 06/30/2024   AST 72 (H) 06/30/2024   NA 141 06/30/2024   K 4.3 06/30/2024   CL 108 06/30/2024   CREATININE 0.53 06/30/2024   BUN 10  06/30/2024   CO2 24 06/30/2024   TSH 1.36 09/12/2023   INR 1.0 03/26/2024   HGBA1C 5.9 06/27/2021    No results found.  Assessment & Plan:  Problem List Items Addressed This Visit     Anxiety disorder   Worse.  Will increase Cymbalta  to 30 mg daily      Deficiency anemia   Probably multifactorial, anemia of chronic disease  S/p surgery - Jun 30 2024: left knee total knee arthroplasty redo by Dr. Dorian under spinal anesthesia  Dr Ishmael did labs Hgb 10.6, MCV 89 - Mid Nov 2025 On Hydroxychloroquine, and Actemra  once a month - Dr [facility] Hematology ref is offered      Relevant Orders   CBC with Differential/Platelet   Iron, TIBC and Ferritin Panel   Vitamin B12   VITAMIN D  25 Hydroxy (Vit-D Deficiency, Fractures)   Vitamin B1   Zinc   RESOLVED: DM2 (diabetes mellitus, type 2) (HCC)   On diet      Fatigue   Labs Hematology ref is offered      Relevant Orders   CBC with Differential/Platelet   Vitamin B12   Vitamin B1   Zinc   Gluten intolerance   Relevant Orders   Iron, TIBC and Ferritin Panel   Vitamin B12   VITAMIN D  25 Hydroxy (Vit-D Deficiency, Fractures)   Vitamin B1   Zinc   Pain and swelling of knee, left - Primary   S/p surgery - Jun 30 2024: left knee total knee arthroplasty redo by Dr. Dorian under spinal anesthesia  Dr Ishmael did labs Hgb 10.6, MCV 89 - Mid Nov 2025         No orders of the defined types were placed in this encounter.     Follow-up: Return in about 3 months (around 12/10/2024) for a follow-up visit.  Marolyn Noel, MD

## 2024-09-11 NOTE — Assessment & Plan Note (Signed)
 S/p surgery - Jun 30 2024: left knee total knee arthroplasty redo by Dr. Dorian under spinal anesthesia  Dr Ishmael did labs Hgb 10.6, MCV 89 - Mid Nov 2025

## 2024-09-11 NOTE — Assessment & Plan Note (Signed)
  On diet  

## 2024-09-11 NOTE — Assessment & Plan Note (Signed)
 Labs Hematology ref is offered

## 2024-09-18 LAB — IRON,TIBC AND FERRITIN PANEL
%SAT: 11 % — ABNORMAL LOW (ref 16–45)
Ferritin: 11 ng/mL — ABNORMAL LOW (ref 16–288)
Iron: 49 ug/dL (ref 45–160)
TIBC: 453 ug/dL — ABNORMAL HIGH (ref 250–450)

## 2024-09-18 LAB — ZINC: Zinc: 95 ug/dL (ref 60–130)

## 2024-09-18 LAB — VITAMIN B1: Vitamin B1 (Thiamine): 94 nmol/L — ABNORMAL HIGH (ref 8–30)

## 2024-09-26 MED ORDER — THIAMINE HCL 100 MG PO TABS: 100.0000 mg | ORAL_TABLET | Freq: Every day | ORAL | 3 refills | Status: AC

## 2024-10-30 ENCOUNTER — Telehealth: Admitting: Nurse Practitioner

## 2024-10-30 ENCOUNTER — Ambulatory Visit
Admission: RE | Admit: 2024-10-30 | Discharge: 2024-10-30 | Disposition: A | Discharge status: Home / Self Care | Source: Ambulatory Visit

## 2024-10-30 VITALS — BP 164/94 | HR 92 | Temp 98.1°F | Resp 17

## 2024-10-30 DIAGNOSIS — H9201 Otalgia, right ear: Secondary | ICD-10-CM

## 2024-10-30 DIAGNOSIS — H9209 Otalgia, unspecified ear: Secondary | ICD-10-CM

## 2024-10-30 MED ORDER — AMOXICILLIN 875 MG PO TABS: 875.0000 mg | ORAL_TABLET | Freq: Two times a day (BID) | ORAL | 0 refills | Status: AC

## 2024-10-30 NOTE — ED Provider Notes (Addendum)
 " [facility]    CSN: [id number] Arrival date & time: 10/30/24  1233      History   Chief Complaint Chief Complaint  Patient presents with   Ear Fullness    I have had lots of ear infections as a child and a few as an adult. I have had three sets of tubes as an adult. I have had a blocked Eustachian tube as an adult. This is what I am feeling today - Entered by patient    HPI Jennifer Davenport is a 74 y.o. female presenting with R ear fullness.  The patient has a long history of otitis media, and has even had 3 sets of tympanostomy tubes as an adult. R ear pressure, popping, hearing changes. Notes nasal congestion and sinus pressure. Denies recent URI.   Accompanied by husband today.  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   Heart murmur 03/26/2024   Cramp in limb 03/12/2024   Palpitations 12/24/2023   Peroneal tendinitis 01/03/2023   Primary localized osteoarthrosis of multiple sites 12/18/2022   Arthralgia of right ankle 12/13/2022   Trochanteric bursitis of right hip 06/20/2022   Insomnia 06/19/2022   Chills 08/23/2021   Pain and swelling of knee, left 08/11/2021   Rigors 08/11/2021   Deficiency anemia 08/11/2021   Postoperative wound infection 08/11/2021   Infection of skin and subcutaneous tissue 08/11/2021   Preop exam for internal medicine 06/14/2021   Complex sclerosing  lesion of right breast 05/12/2021   Knee pain, left 03/14/2021   Breast mass, right 03/14/2021   Cough 09/09/2020   Trigger finger 06/02/2020   Daytime somnolence 01/31/2020   Hand pain, right 12/09/2019   Closed fracture of lateral malleolus 10/03/2019   Edema of lower extremity 06/16/2019   Bunion 05/30/2019   Anxiety disorder 10/08/2018   Neck pain 10/08/2018   Syncope 04/03/2018   CAD (coronary artery disease) 12/06/2017   Near syncope 08/03/2017   Gluten intolerance 08/03/2017   Droopy eyelid, bilateral 08/03/2017   Actinic keratosis 08/22/2016   Carotid bruit 08/17/2015   Well adult exam 02/11/2015   Benign paroxysmal positional vertigo 04/06/2014   NUMBNESS, HAND 03/06/2007   Dyslipidemia 09/28/2006   Essential hypertension 09/28/2006   Allergic rhinitis 09/28/2006   DISORDER, 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   GERD 09/28/2006   Irritable bowel syndrome 09/28/2006   Osteoarthritis 09/28/2006   Arthropathy 09/28/2006   LOW BACK PAIN 09/28/2006    Past Surgical History:  Procedure Laterality Date   ABDOMINAL HYSTERECTOMY  11/1982   AUB & endometriosis, ovaries remain   APPENDECTOMY  1/72   bilateral knee replacements      BREAST LUMPECTOMY WITH RADIOACTIVE SEED LOCALIZATION Right 04/29/2021   Procedure: RIGHT BREAST LUMPECTOMY WITH RADIOACTIVE SEED LOCALIZATION;  Surgeon: Curvin Deward MOULD, MD;  Location: [facility];  Service: General;  Laterality: Right;   BREAST SURGERY     BUNIONECTOMY Left 04/2004   left foot   CATARACT EXTRACTION     CERVIX LESION DESTRUCTION     COLONOSCOPY     CRYOTHERAPY     for abnormal pap smear   EYE SURGERY  07/17/2017   eyelid surgery Bilateral 2018   HYSTEROTOMY  01/1971   fetal demise, also BTL   JOINT REPLACEMENT     LAPAROSCOPIC CHOLECYSTECTOMY  02/2005   POLYPECTOMY     TMJ ARTHROPLASTY  02/1993   secondary to hockey injury 15 years earlier   TOTAL KNEE REVISION Left 06/30/2024   Procedure: TOTAL KNEE REVISION;  Surgeon:  Rubie Kemps, MD;  Location: [facility];  Service: Orthopedics;  Laterality: Left;  Left Knee Polyethylene Exchange   TUBAL LIGATION Bilateral 5/72   UPPER GASTROINTESTINAL ENDOSCOPY      OB History     Gravida  3   Para  2   Term  2   Preterm  0   AB  1   Living  2      SAB  1   IAB  0   Ectopic  0   Multiple  0   Live Births  2            Home Medications    Prior to Admission medications  Medication Sig Start Date End Date Taking? Authorizing Provider  amoxicillin  (AMOXIL ) 875 MG tablet Take 1 tablet (875 mg total) by mouth 2 (two) times daily for 7 days. 10/30/24 11/06/24 Yes Amin Fornwalt E, PA-C  amLODipine  (NORVASC ) 5 MG tablet Take 1 tablet (5 mg total) by mouth daily. 03/12/24   Plotnikov, Aleksei V, MD  atorvastatin  (LIPITOR) 80 MG tablet TAKE ONE TABLET BY MOUTH DAILY AT 6:00PM 03/12/24   Plotnikov, Aleksei V, MD  b complex vitamins tablet Take 1 tablet by mouth daily.    [provider]  Carboxymethylcellulose Sodium (THERATEARS) 0.25 % SOLN Place 1-2 drops into both eyes 3 (three) times daily as needed (dry/irritated eyes.).    [provider]  cetirizine  (ZYRTEC ) 10 MG tablet Take 10 mg by mouth every evening.    [provider]  Chlorpheniramine-DM (CORICIDIN COUGH/COLD) 4-30 MG TABS Take 1 tablet by mouth at bedtime.    [provider]  Cholecalciferol (VITAMIN D ) 50 MCG (2000 UT) tablet Take 2,000 Units by mouth every Monday, Wednesday, and Friday. 10/29/18   [provider]  Coenzyme Q10 (COQ10) 100 MG CAPS Take 100 mg by mouth in the morning. 03/13/24   [provider]  dicyclomine  (BENTYL ) 10 MG capsule TAKE ONE CAPSULE BY MOUTH EVERY 6 TO 8 HOURS AS NEEDED FOR CRAMPING AND SPASMS 03/20/23   Plotnikov, Karlynn GAILS, MD  docusate sodium  (COLACE) 100 MG capsule Take 1 capsule (100 mg total) by mouth daily as needed. 06/30/24 06/30/25  Henry Slater NOVAK, PA-C  DULoxetine  (CYMBALTA ) 30 MG capsule Take 1 capsule (30  mg total) by mouth daily. 03/12/24   Plotnikov, Aleksei V, MD  esomeprazole (NEXIUM) 20 MG capsule Take 20 mg by mouth daily before breakfast.    [provider]  fluticasone (FLONASE) 50 MCG/ACT nasal spray Place 1 spray into both nostrils in the morning.    [provider]  furosemide  (LASIX ) 20 MG tablet TAKE 1/2 TO 1 TABLET BY MOUTH DAILY AS NEEDED FOR EDEMA 04/21/22   Plotnikov,  Aleksei V, MD  hydroxychloroquine (PLAQUENIL) 200 MG tablet Take 200 mg by mouth 2 (two) times daily. 08/27/20   [provider]  LORazepam  (ATIVAN ) 1 MG tablet TAKE 1/2 TO 1 TABLET BY MOUTH TWO TIMES A DAY AS NEEDED FOR ANXIETY 03/12/24   Plotnikov, Karlynn GAILS, MD  losartan  (COZAAR ) 100 MG tablet Take 1 tablet (100 mg total) by mouth daily. 03/12/24   Plotnikov, Aleksei V, MD  methocarbamol  (ROBAXIN ) 500 MG tablet Take 1 tablet (500 mg total) by mouth 4 (four) times daily. 06/30/24   Henry Slater NOVAK, PA-C  ondansetron  (ZOFRAN -ODT) 4 MG disintegrating tablet Take 1 tablet (4 mg total) by mouth every 8 (eight) hours as needed for nausea or vomiting. 06/30/24   Henry Slater NOVAK, PA-C  oxyCODONE  (OXY IR/ROXICODONE ) 5 MG immediate release tablet Take 1 tablet (5 mg total) by mouth every 4 (four) hours as needed for severe pain (pain score 7-10). Patient not taking: Reported on 09/11/2024 06/30/24   Henry Slater NOVAK, PA-C  thiamine  (VITAMIN B1) 100 MG tablet Take 1 tablet (100 mg total) by mouth daily. 09/26/24   Plotnikov, Aleksei V, MD  Tocilizumab  (ACTEMRA ) 200 MG/10ML SOLN Inject 200 mg into the vein every 30 (thirty) days. 01/14/24   [provider]    Family History Family History  Problem Relation Age of Onset   Diverticulitis Mother    Breast cancer Mother 63   Hypertension Mother    Heart disease Mother    Osteoarthritis Mother    CVA Mother    Hypercholesterolemia Mother    Colon polyps Mother    Heart disease Brother    Hypertension Brother    Lung cancer Brother 27       lung  cancer, MI, ETOH   Alcohol abuse Brother    Heart disease Sister    Hypertension Sister    Osteoarthritis Sister    Hypertension Father    Hypertension Maternal Uncle    Heart disease Maternal Uncle    Hypertension Maternal Grandfather    Heart disease Maternal Grandfather    Colon cancer Neg Hx    Rectal cancer Neg Hx    Stomach cancer Neg Hx     Social History Social History[1]   Allergies   Ceftriaxone sodium, Levaquin [levofloxacin], Biaxin [clarithromycin], Sulfonamide derivatives, Aspirin, Benadryl [diphenhydramine], Codeine, Dairy aid [tilactase], Gluten meal, Molnupiravir , Pneumovax 23 [pneumococcal vac polyvalent], and Simvastatin    Review of Systems Review of Systems  HENT:  Positive for ear pain.      Physical Exam Triage Vital Signs ED Triage Vitals  Encounter Vitals Group     BP      Girls Systolic BP Percentile      Girls Diastolic BP Percentile      Boys Systolic BP Percentile      Boys Diastolic BP Percentile      Pulse      Resp      Temp      Temp src      SpO2      Weight      Height      Head Circumference      Peak Flow      Pain Score      Pain Loc      Pain Education      Exclude from Growth Chart    No data found.  Updated Vital Signs BP (!) 164/94 (BP Location: Right Arm)   Pulse 92   Temp 98.1 F (36.7  C) (Oral)   Resp 17   LMP 11/24/1982   SpO2 95%   Visual Acuity Right Eye Distance:   Left Eye Distance:   Bilateral Distance:    Right Eye Near:   Left Eye Near:    Bilateral Near:     Physical Exam Vitals reviewed.  Constitutional:      General: She is not in acute distress.    Appearance: Normal appearance. She is not ill-appearing.  HENT:     Head: Normocephalic and atraumatic.     Right Ear: Hearing, ear canal and external ear normal. No middle ear effusion. No mastoid tenderness. Tympanic membrane is scarred and retracted. Tympanic membrane is not injected, perforated, erythematous or bulging.     Left Ear:  Hearing, tympanic membrane, ear canal and external ear normal.  Pulmonary:     Effort: Pulmonary effort is normal.  Neurological:     General: No focal deficit present.     Mental Status: She is alert and oriented to person, place, and time.  Psychiatric:        Mood and Affect: Mood normal.        Behavior: Behavior normal.        Thought Content: Thought content normal.        Judgment: Judgment normal.      UC Treatments / Results  Labs (all labs ordered are listed, but only abnormal results are displayed) Labs Reviewed - No data to display  EKG   Radiology No results found.  Procedures Procedures (including critical care time)  Medications Ordered in UC Medications - No data to display  Initial Impression / Assessment and Plan / UC Course  I have reviewed the triage vital signs and the nursing notes.  Pertinent labs & imaging results that were available during my care of the patient were reviewed by me and considered in my medical decision making (see chart for details).     Patient is a pleasant 74 y.o. female presenting with R otalgia. The patient is afebrile and nontachycardic.  Antipyretic has not been administered today.  On exam, the tympanic membrane is retracted.  Considering her long history of recurrent otitis media, plan to treat with amoxicillin  today.  Multiple medication allergies: Ceftriaxone (anaphylaxis), Levaquin, Clarithromycin, Sulfa drugs.  The patient tolerates amoxicillin .  Creatinine and GFR wnl 06/2024 CMP.   Final Clinical Impressions(s) / UC Diagnoses   Final diagnoses:  Acute otalgia, right     Discharge Instructions      -Amoxicillin  twice daily x7 days. Take with food like breakfast and dinner -Tylenol  or ibuprofen for discomfort     ED Prescriptions     Medication Sig Dispense Auth. Provider   amoxicillin  (AMOXIL ) 875 MG tablet Take 1 tablet (875 mg total) by mouth 2 (two) times daily for 7 days. 14 tablet Briah Nary,  Cait Locust E, PA-C      PDMP not reviewed this encounter.    Arlyss Leita BRAVO, PA-C 10/30/24 1311     [1]  Social History Tobacco Use   Smoking status: Never   Smokeless tobacco: Never  Vaping Use   Vaping status: Never Used  Substance Use Topics   Alcohol use: Not Currently    Alcohol/week: 1.0 standard drink of alcohol    Types: 1 Glasses of wine per week    Comment: socially   Drug use: No     Arlyss Leita BRAVO, PA-C 10/30/24 1311  "

## 2024-10-30 NOTE — Discharge Instructions (Addendum)
-  Amoxicillin  twice daily x7 days. Take with food like breakfast and dinner -Tylenol  or ibuprofen for discomfort

## 2024-10-30 NOTE — ED Triage Notes (Signed)
 Pt c/o right ear pain.

## 2024-10-30 NOTE — Progress Notes (Signed)
" °  Because of your extensive ear history, I feel your condition warrants further evaluation and I recommend that you be seen in a face-to-face visit. Your symptoms could very well be related to viral illness, which is likely; however with your history, your ears will need to be assessed/evaluated to ensure there is no active infection. You can reach out to your primary care provider or go to a local urgent care, which locations are listed below   NOTE: There will be NO CHARGE for this E-Visit   If you are having a true medical emergency, please call 911.     For an urgent face to face visit, [facility] has multiple urgent care centers for your convenience.  Click the link below for the full list of locations and hours, walk-in wait times, appointment scheduling options and driving directions:  Urgent Care - [place], [place], [place], [place], [place], [place]  [place]     Your MyChart E-visit questionnaire answers were reviewed by a board certified advanced clinical practitioner to complete your personal care plan based on your specific symptoms.    Thank you for using e-Visits.    "

## 2024-12-10 ENCOUNTER — Ambulatory Visit: Admitting: Internal Medicine

## 2025-09-15 ENCOUNTER — Encounter: Admitting: Internal Medicine

## 2025-09-15 ENCOUNTER — Ambulatory Visit
